# Patient Record
Sex: Male | Born: 1970 | Race: White | Hispanic: No | Marital: Married | Smoking: Former smoker
Health system: Southern US, Community
[De-identification: ages and names within clinical notes are randomized; demographics above are authoritative.]

## PROBLEM LIST (undated history)

## (undated) DIAGNOSIS — G4730 Sleep apnea, unspecified: Secondary | ICD-10-CM

## (undated) DIAGNOSIS — M2550 Pain in unspecified joint: Secondary | ICD-10-CM

## (undated) DIAGNOSIS — G4733 Obstructive sleep apnea (adult) (pediatric): Secondary | ICD-10-CM

## (undated) DIAGNOSIS — Z79899 Other long term (current) drug therapy: Secondary | ICD-10-CM

## (undated) DIAGNOSIS — I10 Essential (primary) hypertension: Secondary | ICD-10-CM

## (undated) DIAGNOSIS — R1310 Dysphagia, unspecified: Secondary | ICD-10-CM

## (undated) DIAGNOSIS — K219 Gastro-esophageal reflux disease without esophagitis: Secondary | ICD-10-CM

## (undated) DIAGNOSIS — M549 Dorsalgia, unspecified: Secondary | ICD-10-CM

## (undated) DIAGNOSIS — I4819 Other persistent atrial fibrillation: Secondary | ICD-10-CM

## (undated) DIAGNOSIS — Z6841 Body Mass Index (BMI) 40.0 and over, adult: Secondary | ICD-10-CM

## (undated) DIAGNOSIS — G935 Compression of brain: Secondary | ICD-10-CM

## (undated) DIAGNOSIS — F32A Depression, unspecified: Secondary | ICD-10-CM

## (undated) DIAGNOSIS — T300 Burn of unspecified body region, unspecified degree: Secondary | ICD-10-CM

## (undated) DIAGNOSIS — R079 Chest pain, unspecified: Secondary | ICD-10-CM

## (undated) DIAGNOSIS — E7800 Pure hypercholesterolemia, unspecified: Secondary | ICD-10-CM

## (undated) DIAGNOSIS — R5382 Chronic fatigue, unspecified: Secondary | ICD-10-CM

## (undated) DIAGNOSIS — F419 Anxiety disorder, unspecified: Secondary | ICD-10-CM

## (undated) DIAGNOSIS — I499 Cardiac arrhythmia, unspecified: Secondary | ICD-10-CM

## (undated) DIAGNOSIS — R0602 Shortness of breath: Secondary | ICD-10-CM

## (undated) DIAGNOSIS — K222 Esophageal obstruction: Secondary | ICD-10-CM

## (undated) DIAGNOSIS — R002 Palpitations: Secondary | ICD-10-CM

## (undated) DIAGNOSIS — G9332 Myalgic encephalomyelitis/chronic fatigue syndrome: Secondary | ICD-10-CM

## (undated) HISTORY — DX: Palpitations: R00.2

## (undated) HISTORY — DX: Dysphagia, unspecified: R13.10

## (undated) HISTORY — DX: Essential (primary) hypertension: I10

## (undated) HISTORY — PX: APPENDECTOMY: SHX54

## (undated) HISTORY — DX: Sleep apnea, unspecified: G47.30

## (undated) HISTORY — DX: Morbid (severe) obesity due to excess calories: E66.01

## (undated) HISTORY — PX: KNEE SURGERY: SHX244

## (undated) HISTORY — DX: Shortness of breath: R06.02

## (undated) HISTORY — PX: HERNIA REPAIR: SHX51

## (undated) HISTORY — PX: CARDIAC CATHETERIZATION: SHX172

## (undated) HISTORY — DX: Other persistent atrial fibrillation: I48.19

## (undated) HISTORY — DX: Anxiety disorder, unspecified: F41.9

## (undated) HISTORY — DX: Depression, unspecified: F32.A

## (undated) HISTORY — DX: Chest pain, unspecified: R07.9

## (undated) HISTORY — DX: Other long term (current) drug therapy: Z79.899

## (undated) HISTORY — DX: Cardiac arrhythmia, unspecified: I49.9

## (undated) HISTORY — PX: SKIN GRAFT: SHX250

## (undated) HISTORY — DX: Esophageal obstruction: K22.2

## (undated) HISTORY — DX: Myalgic encephalomyelitis/chronic fatigue syndrome: G93.32

## (undated) HISTORY — DX: Body Mass Index (BMI) 40.0 and over, adult: Z684

## (undated) HISTORY — PX: URETHRA SURGERY: SHX824

## (undated) HISTORY — DX: Dorsalgia, unspecified: M54.9

## (undated) HISTORY — DX: Obstructive sleep apnea (adult) (pediatric): G47.33

## (undated) HISTORY — DX: Pain in unspecified joint: M25.50

## (undated) HISTORY — DX: Chronic fatigue, unspecified: R53.82

## (undated) HISTORY — DX: Gastro-esophageal reflux disease without esophagitis: K21.9

## (undated) HISTORY — DX: Pure hypercholesterolemia, unspecified: E78.00

---

## 2001-06-18 HISTORY — PX: COLONOSCOPY: SHX174

## 2014-09-01 DIAGNOSIS — I4819 Other persistent atrial fibrillation: Secondary | ICD-10-CM | POA: Insufficient documentation

## 2014-09-01 DIAGNOSIS — I10 Essential (primary) hypertension: Secondary | ICD-10-CM

## 2014-09-01 DIAGNOSIS — I119 Hypertensive heart disease without heart failure: Secondary | ICD-10-CM

## 2014-09-01 DIAGNOSIS — Z79899 Other long term (current) drug therapy: Secondary | ICD-10-CM | POA: Insufficient documentation

## 2014-09-01 HISTORY — DX: Other long term (current) drug therapy: Z79.899

## 2014-09-01 HISTORY — DX: Other persistent atrial fibrillation: I48.19

## 2014-09-01 HISTORY — DX: Hypertensive heart disease without heart failure: I11.9

## 2014-09-01 HISTORY — DX: Essential (primary) hypertension: I10

## 2014-09-02 DIAGNOSIS — K222 Esophageal obstruction: Secondary | ICD-10-CM | POA: Insufficient documentation

## 2014-09-02 DIAGNOSIS — F419 Anxiety disorder, unspecified: Secondary | ICD-10-CM

## 2014-09-02 HISTORY — DX: Esophageal obstruction: K22.2

## 2014-09-02 HISTORY — DX: Anxiety disorder, unspecified: F41.9

## 2014-12-17 HISTORY — PX: ESOPHAGOGASTRODUODENOSCOPY: SHX1529

## 2016-04-05 DIAGNOSIS — Z6841 Body Mass Index (BMI) 40.0 and over, adult: Secondary | ICD-10-CM

## 2016-04-05 DIAGNOSIS — I209 Angina pectoris, unspecified: Secondary | ICD-10-CM

## 2016-04-05 HISTORY — DX: Morbid (severe) obesity due to excess calories: E66.01

## 2016-04-05 HISTORY — DX: Angina pectoris, unspecified: I20.9

## 2016-06-05 ENCOUNTER — Encounter (HOSPITAL_COMMUNITY): Payer: Self-pay | Admitting: Nurse Practitioner

## 2016-06-05 ENCOUNTER — Emergency Department (HOSPITAL_COMMUNITY): Payer: Worker's Compensation

## 2016-06-05 ENCOUNTER — Emergency Department (HOSPITAL_COMMUNITY)
Admission: EM | Admit: 2016-06-05 | Discharge: 2016-06-05 | Disposition: A | Discharge status: Home / Self Care | Payer: Worker's Compensation | Attending: Emergency Medicine | Admitting: Emergency Medicine

## 2016-06-05 DIAGNOSIS — Z23 Encounter for immunization: Secondary | ICD-10-CM | POA: Diagnosis not present

## 2016-06-05 DIAGNOSIS — S20219A Contusion of unspecified front wall of thorax, initial encounter: Secondary | ICD-10-CM | POA: Insufficient documentation

## 2016-06-05 DIAGNOSIS — Z79899 Other long term (current) drug therapy: Secondary | ICD-10-CM | POA: Diagnosis not present

## 2016-06-05 DIAGNOSIS — S20211A Contusion of right front wall of thorax, initial encounter: Secondary | ICD-10-CM

## 2016-06-05 DIAGNOSIS — R1030 Lower abdominal pain, unspecified: Secondary | ICD-10-CM | POA: Diagnosis not present

## 2016-06-05 DIAGNOSIS — Y92410 Unspecified street and highway as the place of occurrence of the external cause: Secondary | ICD-10-CM | POA: Diagnosis not present

## 2016-06-05 DIAGNOSIS — S8012XA Contusion of left lower leg, initial encounter: Secondary | ICD-10-CM

## 2016-06-05 DIAGNOSIS — I10 Essential (primary) hypertension: Secondary | ICD-10-CM | POA: Insufficient documentation

## 2016-06-05 DIAGNOSIS — Y939 Activity, unspecified: Secondary | ICD-10-CM | POA: Insufficient documentation

## 2016-06-05 DIAGNOSIS — S0990XA Unspecified injury of head, initial encounter: Secondary | ICD-10-CM | POA: Diagnosis present

## 2016-06-05 DIAGNOSIS — Y999 Unspecified external cause status: Secondary | ICD-10-CM | POA: Insufficient documentation

## 2016-06-05 DIAGNOSIS — R079 Chest pain, unspecified: Secondary | ICD-10-CM | POA: Diagnosis not present

## 2016-06-05 DIAGNOSIS — S8001XA Contusion of right knee, initial encounter: Secondary | ICD-10-CM

## 2016-06-05 DIAGNOSIS — S0083XA Contusion of other part of head, initial encounter: Secondary | ICD-10-CM | POA: Diagnosis not present

## 2016-06-05 DIAGNOSIS — T07XXXA Unspecified multiple injuries, initial encounter: Secondary | ICD-10-CM

## 2016-06-05 HISTORY — DX: Compression of brain: G93.5

## 2016-06-05 HISTORY — DX: Essential (primary) hypertension: I10

## 2016-06-05 HISTORY — DX: Burn of unspecified body region, unspecified degree: T30.0

## 2016-06-05 LAB — I-STAT CHEM 8, ED
BUN: 19 mg/dL (ref 6–20)
CREATININE: 1.1 mg/dL (ref 0.61–1.24)
Calcium, Ion: 1.15 mmol/L (ref 1.15–1.40)
Chloride: 106 mmol/L (ref 101–111)
Glucose, Bld: 103 mg/dL — ABNORMAL HIGH (ref 65–99)
HEMATOCRIT: 44 % (ref 39.0–52.0)
HEMOGLOBIN: 15 g/dL (ref 13.0–17.0)
POTASSIUM: 3.7 mmol/L (ref 3.5–5.1)
SODIUM: 140 mmol/L (ref 135–145)
TCO2: 23 mmol/L (ref 0–100)

## 2016-06-05 LAB — COMPREHENSIVE METABOLIC PANEL
ALBUMIN: 4.1 g/dL (ref 3.5–5.0)
ALT: 23 U/L (ref 17–63)
ANION GAP: 11 (ref 5–15)
AST: 25 U/L (ref 15–41)
Alkaline Phosphatase: 51 U/L (ref 38–126)
BUN: 15 mg/dL (ref 6–20)
CO2: 23 mmol/L (ref 22–32)
Calcium: 9.3 mg/dL (ref 8.9–10.3)
Chloride: 105 mmol/L (ref 101–111)
Creatinine, Ser: 1.11 mg/dL (ref 0.61–1.24)
GFR calc Af Amer: >60 mL/min (ref >60)
Glucose, Bld: 102 mg/dL — ABNORMAL HIGH (ref 65–99)
POTASSIUM: 3.7 mmol/L (ref 3.5–5.1)
Sodium: 139 mmol/L (ref 135–145)
TOTAL PROTEIN: 6.4 g/dL — AB (ref 6.5–8.1)
Total Bilirubin: 0.8 mg/dL (ref 0.3–1.2)

## 2016-06-05 LAB — CBC
HEMATOCRIT: 44.7 % (ref 39.0–52.0)
Hemoglobin: 15.6 g/dL (ref 13.0–17.0)
MCH: 30.7 pg (ref 26.0–34.0)
MCHC: 34.9 g/dL (ref 30.0–36.0)
MCV: 88 fL (ref 78.0–100.0)
Platelets: 230 10*3/uL (ref 150–400)
RBC: 5.08 MIL/uL (ref 4.22–5.81)
RDW: 13.2 % (ref 11.5–15.5)
WBC: 11.5 10*3/uL — AB (ref 4.0–10.5)

## 2016-06-05 LAB — URINALYSIS, ROUTINE W REFLEX MICROSCOPIC
Bilirubin Urine: NEGATIVE
Glucose, UA: NEGATIVE mg/dL
Hgb urine dipstick: NEGATIVE
Ketones, ur: NEGATIVE mg/dL
LEUKOCYTES UA: NEGATIVE
NITRITE: NEGATIVE
Protein, ur: NEGATIVE mg/dL
SPECIFIC GRAVITY, URINE: 1.006 (ref 1.005–1.030)
pH: 5 (ref 5.0–8.0)

## 2016-06-05 LAB — I-STAT CG4 LACTIC ACID, ED: LACTIC ACID, VENOUS: 1.91 mmol/L — AB (ref 0.5–1.9)

## 2016-06-05 LAB — SAMPLE TO BLOOD BANK

## 2016-06-05 LAB — PROTIME-INR
INR: 1.41
Prothrombin Time: 17.4 seconds — ABNORMAL HIGH (ref 11.4–15.2)

## 2016-06-05 LAB — ETHANOL: Alcohol, Ethyl (B): <5 mg/dL (ref <5)

## 2016-06-05 MED ORDER — HYDROCODONE-ACETAMINOPHEN 5-325 MG PO TABS: 1.0000 | ORAL_TABLET | ORAL | 0 refills | Status: DC | PRN

## 2016-06-05 MED ORDER — CYCLOBENZAPRINE HCL 10 MG PO TABS: 10.0000 mg | ORAL_TABLET | Freq: Three times a day (TID) | ORAL | 0 refills | Status: DC | PRN

## 2016-06-05 MED ORDER — SODIUM CHLORIDE 0.9 % IV BOLUS (SEPSIS)
1000.0000 mL | Freq: Once | INTRAVENOUS | Status: AC
  Administered 2016-06-05: 1000 mL via INTRAVENOUS

## 2016-06-05 MED ORDER — TETANUS-DIPHTH-ACELL PERTUSSIS 5-2.5-18.5 LF-MCG/0.5 IM SUSP
0.5000 mL | Freq: Once | INTRAMUSCULAR | Status: AC
  Administered 2016-06-05: 0.5 mL via INTRAMUSCULAR
  Filled 2016-06-05: qty 0.5

## 2016-06-05 MED ORDER — MORPHINE SULFATE (PF) 4 MG/ML IV SOLN
4.0000 mg | Freq: Once | INTRAVENOUS | Status: AC
  Administered 2016-06-05: 4 mg via INTRAVENOUS
  Filled 2016-06-05: qty 1

## 2016-06-05 MED ORDER — IOPAMIDOL (ISOVUE-300) INJECTION 61%
INTRAVENOUS | Status: AC
  Administered 2016-06-05: 100 mL
  Filled 2016-06-05: qty 100

## 2016-06-05 MED ORDER — FENTANYL CITRATE (PF) 100 MCG/2ML IJ SOLN
50.0000 ug | Freq: Once | INTRAMUSCULAR | Status: AC
  Administered 2016-06-05: 50 ug via INTRAVENOUS
  Filled 2016-06-05: qty 2

## 2016-06-05 MED ORDER — ONDANSETRON HCL 4 MG/2ML IJ SOLN
4.0000 mg | Freq: Once | INTRAMUSCULAR | Status: AC
  Administered 2016-06-05: 4 mg via INTRAVENOUS
  Filled 2016-06-05: qty 2

## 2016-06-05 NOTE — ED Notes (Signed)
Applied bacitracin ointment and gauze to affected areas.

## 2016-06-05 NOTE — ED Notes (Signed)
Pt to scans 

## 2016-06-05 NOTE — ED Notes (Signed)
Patient returned from scans.
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
## 2016-06-05 NOTE — Discharge Instructions (Signed)
Read the information below.  Use the prescribed medication as directed.  Please discuss all new medications with your pharmacist.  Do not take additional tylenol while taking the prescribed pain medication to avoid overdose.  You may return to the Emergency Department at any time for worsening condition or any new symptoms that concern you.     If you develop uncontrolled pain, weakness or numbness of the extremity, severe discoloration of the skin, your foot turns cold, or you are unable to walk or move your leg, return to the ER for a recheck.     If you develop redness, swelling, pus draining from the wound, or fevers greater than 100.4, return to the ER immediately for a recheck.    Please do not take your blood thinner today or tomorrow.  Speak with your primary care provider and resume taking it the next day.
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
## 2016-09-07 ENCOUNTER — Telehealth: Payer: Self-pay | Admitting: Cardiology

## 2016-09-07 NOTE — Telephone Encounter (Signed)
Closed Encounter  °

## 2016-09-12 ENCOUNTER — Encounter: Payer: Self-pay | Admitting: Cardiology

## 2016-10-02 ENCOUNTER — Ambulatory Visit: Payer: Self-pay | Admitting: Cardiology

## 2016-10-15 DIAGNOSIS — I160 Hypertensive urgency: Secondary | ICD-10-CM | POA: Diagnosis not present

## 2016-10-15 DIAGNOSIS — K219 Gastro-esophageal reflux disease without esophagitis: Secondary | ICD-10-CM | POA: Diagnosis not present

## 2016-10-15 DIAGNOSIS — I4891 Unspecified atrial fibrillation: Secondary | ICD-10-CM | POA: Diagnosis not present

## 2016-10-15 DIAGNOSIS — R079 Chest pain, unspecified: Secondary | ICD-10-CM | POA: Diagnosis not present

## 2016-10-16 ENCOUNTER — Encounter: Payer: Self-pay | Admitting: Cardiology

## 2016-10-16 DIAGNOSIS — Z79899 Other long term (current) drug therapy: Secondary | ICD-10-CM | POA: Insufficient documentation

## 2016-10-16 DIAGNOSIS — Z7901 Long term (current) use of anticoagulants: Secondary | ICD-10-CM | POA: Insufficient documentation

## 2016-10-16 DIAGNOSIS — I4891 Unspecified atrial fibrillation: Secondary | ICD-10-CM | POA: Diagnosis not present

## 2016-10-16 DIAGNOSIS — I2510 Atherosclerotic heart disease of native coronary artery without angina pectoris: Secondary | ICD-10-CM | POA: Insufficient documentation

## 2016-10-16 DIAGNOSIS — I420 Dilated cardiomyopathy: Secondary | ICD-10-CM

## 2016-10-16 DIAGNOSIS — I160 Hypertensive urgency: Secondary | ICD-10-CM | POA: Diagnosis not present

## 2016-10-16 DIAGNOSIS — K219 Gastro-esophageal reflux disease without esophagitis: Secondary | ICD-10-CM | POA: Diagnosis not present

## 2016-10-16 DIAGNOSIS — Z5181 Encounter for therapeutic drug level monitoring: Secondary | ICD-10-CM | POA: Insufficient documentation

## 2016-10-16 DIAGNOSIS — R079 Chest pain, unspecified: Secondary | ICD-10-CM | POA: Diagnosis not present

## 2016-10-16 HISTORY — DX: Other long term (current) drug therapy: Z79.899

## 2016-10-16 HISTORY — DX: Atherosclerotic heart disease of native coronary artery without angina pectoris: I25.10

## 2016-10-16 HISTORY — DX: Encounter for therapeutic drug level monitoring: Z51.81

## 2016-10-16 HISTORY — DX: Long term (current) use of anticoagulants: Z79.01

## 2016-10-16 HISTORY — DX: Dilated cardiomyopathy: I42.0

## 2016-10-17 ENCOUNTER — Encounter: Payer: Self-pay | Admitting: Cardiology

## 2016-10-17 ENCOUNTER — Ambulatory Visit (INDEPENDENT_AMBULATORY_CARE_PROVIDER_SITE_OTHER): Payer: Self-pay | Admitting: Cardiology

## 2016-10-17 VITALS — BP 124/94 | HR 84 | Ht 75.0 in | Wt 365.4 lb

## 2016-10-17 DIAGNOSIS — I481 Persistent atrial fibrillation: Secondary | ICD-10-CM | POA: Diagnosis not present

## 2016-10-17 DIAGNOSIS — Z7901 Long term (current) use of anticoagulants: Secondary | ICD-10-CM

## 2016-10-17 DIAGNOSIS — I4819 Other persistent atrial fibrillation: Secondary | ICD-10-CM

## 2016-10-17 DIAGNOSIS — I2510 Atherosclerotic heart disease of native coronary artery without angina pectoris: Secondary | ICD-10-CM

## 2016-10-17 DIAGNOSIS — I420 Dilated cardiomyopathy: Secondary | ICD-10-CM
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
## 2016-10-17 NOTE — Patient Instructions (Addendum)
Medication Instructions:  Your physician recommends that you continue on your current medications as directed. Please refer to the Current Medication list given to you today.  Labwork: None  Testing/Procedures: Your physician has recommended that you wear a holter monitor. Holter monitors are medical devices that record the heart's electrical activity. Doctors most often use these monitors to diagnose arrhythmias. Arrhythmias are problems with the speed or rhythm of the heartbeat. The monitor is a small, portable device. You can wear one while you do your normal daily activities. This is usually used to diagnose what is causing palpitations/syncope (passing out).   You had an EKG today   Follow-Up: You will see Dr Johney FrameAllred with [facility] at [facility]  Any Other Special Instructions Will Be Listed Below (If Applicable).     If you need a refill on your cardiac medications before your next appointment, please call your pharmacy.

## 2016-10-30 ENCOUNTER — Institutional Professional Consult (permissible substitution): Payer: Self-pay | Admitting: Internal Medicine

## 2016-10-31 ENCOUNTER — Encounter: Payer: Self-pay | Admitting: *Deleted

## 2016-10-31 ENCOUNTER — Ambulatory Visit (INDEPENDENT_AMBULATORY_CARE_PROVIDER_SITE_OTHER): Payer: 59

## 2016-10-31 DIAGNOSIS — I481 Persistent atrial fibrillation: Secondary | ICD-10-CM

## 2016-10-31 DIAGNOSIS — I4819 Other persistent atrial fibrillation: Secondary | ICD-10-CM

## 2016-11-06 ENCOUNTER — Telehealth: Payer: Self-pay | Admitting: Pharmacist

## 2016-11-06 ENCOUNTER — Other Ambulatory Visit: Payer: Self-pay | Admitting: Cardiology

## 2016-11-06 ENCOUNTER — Ambulatory Visit (INDEPENDENT_AMBULATORY_CARE_PROVIDER_SITE_OTHER): Payer: 59 | Admitting: Internal Medicine

## 2016-11-06 ENCOUNTER — Telehealth: Payer: Self-pay | Admitting: Internal Medicine

## 2016-11-06 ENCOUNTER — Encounter: Payer: Self-pay | Admitting: Internal Medicine

## 2016-11-06 VITALS — BP 116/84 | HR 96 | Ht 75.0 in | Wt 352.6 lb

## 2016-11-06 DIAGNOSIS — G4733 Obstructive sleep apnea (adult) (pediatric): Secondary | ICD-10-CM

## 2016-11-06 DIAGNOSIS — I482 Chronic atrial fibrillation, unspecified: Secondary | ICD-10-CM

## 2016-11-06 DIAGNOSIS — I481 Persistent atrial fibrillation: Secondary | ICD-10-CM | POA: Diagnosis not present

## 2016-11-06 DIAGNOSIS — I4819 Other persistent atrial fibrillation: Secondary | ICD-10-CM

## 2016-11-06 DIAGNOSIS — I420 Dilated cardiomyopathy: Secondary | ICD-10-CM | POA: Diagnosis not present

## 2016-11-06 MED ORDER — DILTIAZEM HCL ER COATED BEADS 180 MG PO CP24: 180.0000 mg | ORAL_CAPSULE | Freq: Every day | ORAL | 3 refills | Status: DC

## 2016-11-06 NOTE — Telephone Encounter (Signed)
New message   Pt states he called his insurance per Dr. Johney Frame and the insurance company told him that our office had to call. They do not have record of any procedures. He needs assistance with this as son as possible for his prescription Tykosin.

## 2016-11-06 NOTE — Progress Notes (Signed)
Electrophysiology Office Note   Date:  11/06/2016   ID:  Jesus Barnes, DOB 01-07-1971, MRN [id number]  PCP:  [name], PA-C  Cardiologist:  Dr Dulce Sellar Primary Electrophysiologist: Hillis Range, MD    Chief Complaint  Patient presents with  . Atrial Fibrillation     History of Present Illness: Jesus Barnes is a 46 y.o. male who presents today for electrophysiology evaluation.   The patient is referred by Dr Dulce Sellar for EP consultation regarding longstanding persistent afib.  He thinks that he has been in afib since summer of 2017.  He has fatigue and decreased exercise tolerate with his afib.   He reports initially being diagnosed with atrial fibrillation more than 10 years ago.  He was evaluated by Dr Dulce Sellar and was prescribed flecainide.  He did very well with this medicine for several years.  He eventually stopped flecainide because he felt well.  Over time, his afib returned.  He has had more refractory afib since.  He has been followed by Dr Karren Burly and says that he has had an ablation previously (I do not have records to confirm this).  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 He has had difficulty with lifestyle modification.  He has weighed as much as 400 lbs previously but is currently 350 lbs.  He states that he has been diagnosed with OSA but has not started CPAP yet.  He is "waiting to hear from his primary doctor".   Today, he denies symptoms of  orthopnea, PND, lower extremity edema, claudication, dizziness, presyncope, syncope, bleeding, or neurologic sequela. The patient is tolerating medications without difficulties and is otherwise without complaint today.    Past Medical History:  Diagnosis Date  . Anxiety 09/02/2014  . Burn   . Esophageal stricture 09/02/2014  . Essential hypertension 09/01/2014  . 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HCC)   . Hypertension   . Morbid obesity with body mass index (BMI) of 45.0 to 49.9 in adult [facility]) 04/05/2016  . Obstructive sleep apnea     awaiting treatment  . Persistent atrial fibrillation (HCC) 09/01/2014   Overview:  CHADS2=1   Past Surgical History:  Procedure Laterality Date  . APPENDECTOMY    . CARDIOVERSION    . KNEE SURGERY    . SKIN GRAFT       Current Outpatient Prescriptions  Medication Sig Dispense Refill  . FLUoxetine (PROZAC) 20 MG capsule Take 20 mg by mouth daily. Take 1 - 2 tablets mouth at bedtime as needed    . lisinopril (PRINIVIL,ZESTRIL) 10 MG tablet Take 10 mg by mouth daily.    . metoprolol tartrate (LOPRESSOR) 50 MG tablet Take 1.5 tablets by mouth 2 (two) times daily.    . nitroGLYCERIN (NITROSTAT) 0.4 MG SL tablet Place 0.4 mg under the tongue every 5 (five) minutes as needed for chest pain. Chest pain    . pantoprazole (PROTONIX) 40 MG tablet Take 40 mg by mouth daily.    Carlena Hurl 20 MG TABS tablet Take 20 mg by mouth every evening.    . diltiazem (CARDIZEM CD) 180 MG 24 hr capsule Take 1 capsule (180 mg total) by mouth daily. 30 capsule 3   No current facility-administered medications for this visit.     Allergies:   Patient has no known allergies.   Social History:  The patient  reports that he has quit smoking. His smoking use included Cigars. He has never used smokeless tobacco. He reports that he drinks alcohol. He reports that  he does not use drugs.   Family History:  The patient's  family history includes Diabetes in his paternal grandfather; Heart disease in his paternal grandfather.    ROS:  Please see the history of present illness.   All other systems are personally reviewed and negative.    PHYSICAL EXAM: VS:  BP 116/84   Pulse 96   Ht 6\' 3"  (1.905 m)   Wt (!) 352 lb 9.6 oz (159.9 kg)   SpO2 98%   BMI 44.07 kg/m  , BMI Body mass index is 44.07 kg/m. GEN: morbidly obese, in no acute distress  HEENT: normal  Neck: no JVD, carotid bruits, or masses Cardiac: iRRR; no murmurs, rubs, or gallops,no edema  Respiratory:  clear to auscultation bilaterally, normal work  of breathing GI: soft, nontender, nondistended, + BS MS: no deformity or atrophy  Skin: warm and dry  Neuro:  Strength and sensation are intact Psych: euthymic mood, full affect  Prior ekgs are reviewed and reveal afib,  Incomplete RBBB, QTc stable and appropriate for tikosyn  Recent Labs: 06/05/2016: ALT 23; BUN 19; Creatinine, Ser 1.10; Hemoglobin 15.0; Platelets 230; Potassium 3.7; Sodium 140  personally reviewed   Lipid Panel  No results found for: CHOL, TRIG, HDL, CHOLHDL, VLDL, LDLCALC, LDLDIRECT personally reviewed   Wt Readings from Last 3 Encounters:  11/06/16 (!) 352 lb 9.6 oz (159.9 kg)  10/17/16 (!) 365 lb 6.4 oz (165.7 kg)  06/05/16 (!) 351 lb (159.2 kg)     Other studies personally reviewed: Additional studies/ records that were reviewed today include: Dr Hulen Shouts notes, recent holter monitor Review of the above records today demonstrates: as above   ASSESSMENT AND PLAN:  1.  Longstanding persistent afib Very symptomatic Limited options I think that given morbid obesity, untreated sleep apnea, and longstanding persistent afib that his anticipated success with ablation is quite low.  I also worry that given his young age that AV nodal ablation may not be a good option. I would advise tikosyn as our next step.  We will arrange for admission at the next available time for initiation of tikosyn. He has a previously implanted loop recorder in place.  I would likely remove this during his hospitalization for tikosyn. Request records from Dr Lang Snow  2. 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 Morbid obesity Body mass index is 44.07 kg/m. Lifestyle modification discussed at length I have reviewed the patients BMI and decreased success rates with ablation at length today.  Weight loss is strongly advised.  Per Guijian et al (PACE 2013; 36: 423-953), patients with BMI 25-29.9  (obese) have a 27% increase in AF recurrence post ablation.  Patients with BMI >30 have a 31% increase in AF recurrence post ablation when compared to those with BMI <25.  4. HTN Stable No change required today  5. OSA He will need to initiate treatment.  I have advised that he contact his PCP   Current medicines are reviewed at length with the patient today.   The patient does not have concerns regarding his medicines.  The following changes were made today:  none  Will plan initiation of tikosyn later this week  Signed, Hillis Range, MD  11/06/2016 4:09 PM     [facility] [address] [place] [address] [phone] (office) [phone] (fax)

## 2016-11-06 NOTE — Patient Instructions (Addendum)
Medication Instructions:  Your physician recommends that you continue on your current medications as directed. Please refer to the Current Medication list given to you today.   Labwork: None ordered   Testing/Procedures: Tikosyn load--- Be at the afib clinic tomorrow at [place].  [employer] but leave in car.  Will be admitted tomorrow   Follow-Up: Your physician recommends that you schedule a follow-up appointment To be determined    Please call your insurance company to discuss price of medication.  Dofetilide which is the generic for Tikosyn

## 2016-11-06 NOTE — Telephone Encounter (Signed)
Medication list reviewed in anticipation of upcoming Tikosyn initiation tomorrow. Patient is not currently on any contraindicated medications; however, pt is taking fluoxetine which can cause QT prolongation. Given that pt already has baseline elevated QTc, recommend transitioning fluoxetine to Cymbalta. Per Dr. Hulen Shouts note, pt has previously been on amiodarone, but it is unclear when pt was taken off of this medication - please confirm he has been off for at least 3 months or obtain an amiodarone level. Patient is anticoagulated on Xarelto 20mg  on the appropriate dose. Of note, last BMET was in March 2018 and potassium was low at this time - may need to replete prior to admission for Tikosyn initiation.  Please ensure that patient has not missed any anticoagulation doses in the 3 weeks prior to Tikosyn initiation. Patient will need to be counseled to avoid use of Benadryl while on Tikosyn and in the 2-3 days prior to Tikosyn initiation.

## 2016-11-07 ENCOUNTER — Telehealth: Payer: Self-pay

## 2016-11-07 ENCOUNTER — Ambulatory Visit (HOSPITAL_COMMUNITY)
Admission: RE | Admit: 2016-11-07 | Discharge: 2016-11-07 | Disposition: A | Discharge status: Home / Self Care | Payer: 59 | Source: Ambulatory Visit | Attending: Nurse Practitioner | Admitting: Nurse Practitioner

## 2016-11-07 ENCOUNTER — Other Ambulatory Visit: Payer: Self-pay

## 2016-11-07 VITALS — BP 110/78 | HR 74 | Ht 75.0 in | Wt 354.2 lb

## 2016-11-07 DIAGNOSIS — Z833 Family history of diabetes mellitus: Secondary | ICD-10-CM | POA: Insufficient documentation

## 2016-11-07 DIAGNOSIS — Z87891 Personal history of nicotine dependence: Secondary | ICD-10-CM | POA: Diagnosis not present

## 2016-11-07 DIAGNOSIS — F419 Anxiety disorder, unspecified: Secondary | ICD-10-CM | POA: Insufficient documentation

## 2016-11-07 DIAGNOSIS — I10 Essential (primary) hypertension: Secondary | ICD-10-CM | POA: Insufficient documentation

## 2016-11-07 DIAGNOSIS — I481 Persistent atrial fibrillation: Secondary | ICD-10-CM | POA: Diagnosis present

## 2016-11-07 DIAGNOSIS — Z8249 Family history of ischemic heart disease and other diseases of the circulatory system: Secondary | ICD-10-CM | POA: Insufficient documentation

## 2016-11-07 DIAGNOSIS — Z9889 Other specified postprocedural states: Secondary | ICD-10-CM | POA: Insufficient documentation

## 2016-11-07 DIAGNOSIS — G4733 Obstructive sleep apnea (adult) (pediatric): Secondary | ICD-10-CM | POA: Insufficient documentation

## 2016-11-07 DIAGNOSIS — Z7902 Long term (current) use of antithrombotics/antiplatelets: Secondary | ICD-10-CM | POA: Insufficient documentation

## 2016-11-07 DIAGNOSIS — Z6841 Body Mass Index (BMI) 40.0 and over, adult: Secondary | ICD-10-CM | POA: Diagnosis not present

## 2016-11-07 DIAGNOSIS — I4819 Other persistent atrial fibrillation: Secondary | ICD-10-CM

## 2016-11-07 LAB — BASIC METABOLIC PANEL
ANION GAP: 7 (ref 5–15)
BUN: 17 mg/dL (ref 6–20)
CHLORIDE: 106 mmol/L (ref 101–111)
CO2: 25 mmol/L (ref 22–32)
CREATININE: 0.91 mg/dL (ref 0.61–1.24)
Calcium: 9 mg/dL (ref 8.9–10.3)
GFR calc non Af Amer: >60 mL/min (ref >60)
Glucose, Bld: 118 mg/dL — ABNORMAL HIGH (ref 65–99)
POTASSIUM: 4.3 mmol/L (ref 3.5–5.1)
Sodium: 138 mmol/L (ref 135–145)

## 2016-11-07 LAB — MAGNESIUM: MAGNESIUM: 2 mg/dL (ref 1.7–2.4)

## 2016-11-07 NOTE — Progress Notes (Signed)
Primary Care Physician: [name], PA-C Referring Physician: Dr. Johney Frame Cardiologist:    Jax Kentner is a 46 y.o. male with a h/o persistent afib in the past with flecainde working for a while, tried  amiodarone to restore SR, but was not effective.He thinks in persisitence afib since summer of last year.. Weight in past thought to be a deterrent to ablation. H/o TMC at 40%. Previously seen by [name] in [facility].  He ws seen by Dr. Johney Frame yesterday and plans put in place for admission for tikosyn today. Pt today, reports that he may have been on amiodarone as late as early June. He did pick up a refill on 5/21 and it was listed on PCP list of meds when he was seen there June 19th. Pt cannot remember why it was stopped but no longer on drug. Since it has not been 3 months since he was off drug, will have to put off admission and draw an amiodarone level today. Has been dx'ed recently with OSA but has not started CPAP. He is on xarelto 20 mg daily for chadsvasc score of at least 2.  Today, he denies symptoms of palpitations, chest pain, shortness of breath, orthopnea, PND, lower extremity edema, dizziness, presyncope, syncope, or neurologic sequela. The patient is tolerating medications without difficulties and is otherwise without complaint today.   Past Medical History:  Diagnosis Date  . Anxiety 09/02/2014  . Burn   . Esophageal stricture 09/02/2014  . Essential hypertension 09/01/2014  . 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HCC)   . Hypertension   . Morbid obesity with body mass index (BMI) of 45.0 to 49.9 in adult [facility]) 04/05/2016  . Obstructive sleep apnea    awaiting treatment  . Persistent atrial fibrillation (HCC) 09/01/2014   Overview:  CHADS2=1   Past Surgical History:  Procedure Laterality Date  . APPENDECTOMY    . CARDIOVERSION    . KNEE SURGERY    . SKIN GRAFT      Current Outpatient Prescriptions  Medication Sig Dispense Refill  . FLUoxetine (PROZAC) 20 MG capsule Take 20 mg by  mouth daily. Take 1 - 2 tablets mouth at bedtime as needed    . lisinopril (PRINIVIL,ZESTRIL) 10 MG tablet Take 10 mg by mouth daily.    . metoprolol tartrate (LOPRESSOR) 50 MG tablet Take 1.5 tablets by mouth 2 (two) times daily.    . nitroGLYCERIN (NITROSTAT) 0.4 MG SL tablet Place 0.4 mg under the tongue every 5 (five) minutes as needed for chest pain. Chest pain    . pantoprazole (PROTONIX) 40 MG tablet Take 40 mg by mouth daily.    Carlena Hurl 20 MG TABS tablet Take 20 mg by mouth every evening.     No current facility-administered medications for this encounter.     No Known Allergies  Social History   Social History  . Marital status: Married    Spouse name: N/A  . Number of children: N/A  . Years of education: N/A   Occupational History  . Not on file.   Social History Main Topics  . Smoking status: Former Smoker    Types: Cigars  . Smokeless tobacco: Never Used     Comment: ocassional cigar  . Alcohol use Yes     Comment: weekends  . Drug use: No  . Sexual activity: Not on file   Other Topics Concern  . 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 Diabetes Paternal Grandfather   . Heart disease Paternal Grandfather     ROS- All systems are reviewed and negative except as per the HPI above  Physical Exam: Vitals:   11/07/16 1120  BP: 110/78  Pulse: 74  Weight: (!) 354 lb 3.2 oz (160.7 kg)  Height: 6\' 3"  (1.905 m)   Wt Readings from Last 3 Encounters:  11/07/16 (!) 354 lb 3.2 oz (160.7 kg)  11/06/16 (!) 352 lb 9.6 oz (159.9 kg)  10/17/16 (!) 365 lb 6.4 oz (165.7 kg)    Labs: Lab Results  Component Value Date   NA 138 11/07/2016   K 4.3 11/07/2016   CL 106 11/07/2016   CO2 25 11/07/2016   GLUCOSE 118 (H) 11/07/2016   BUN 17 11/07/2016   CREATININE 0.91 11/07/2016   CALCIUM 9.0 11/07/2016   MG 2.0 11/07/2016   Lab Results  Component Value Date   INR  1.41 06/05/2016   No results found for: CHOL, HDL, LDLCALC, TRIG   GEN- The patient is well appearing, alert and oriented x 3 today.   Head- normocephalic, atraumatic Eyes-  Sclera clear, conjunctiva pink Ears- hearing intact Oropharynx- clear Neck- supple, no JVP Lymph- no cervical lymphadenopathy Lungs- Clear to ausculation bilaterally, normal work of breathing Heart- Regular rate and rhythm, no murmurs, rubs or gallops, PMI not laterally displaced GI- soft, NT, ND, + BS Extremities- no clubbing, cyanosis, or edema MS- no significant deformity or atrophy Skin- no rash or lesion Psych- euthymic mood, full affect Neuro- strength and sensation are intact  EKG-afib at 74 bpm, qrs int 102 ms, qtc 479 ms Epic records reviewed    Assessment and Plan: 1. Persistent afib  Pt is not sure when he stopped amiodarone, have reason to believe he was on this drug as of the middle of June, so will not plan for admission today but draw an amiodarone level and plan admission based on the results of this. Drugs have been reviewed by PharmD, he is on Prozac which can prolong qtc but Dr. Johney Frame is ok to keep drug on board. Qtc is 479 ms No missed doses of xarelto.   Will inform pt of plan when level is back  Lupita Leash C. [facility] [address] [place], [place] [address] [phone]

## 2016-11-07 NOTE — Telephone Encounter (Signed)
Plan changed to not start diltiazem 180 mg. Informed pharmacy of change. Left patient message to return call to advise him not to begin medication.

## 2016-11-07 NOTE — Telephone Encounter (Signed)
Insurance company contacted - unable to give exact amt of cost due to need for preauthorization. Talked with wife they are ok with whatever the cost will be "if it will help him".

## 2016-11-07 NOTE — Telephone Encounter (Signed)
Informed Kathie Rhodes for patient not to take diltiazem. Medication was ordered yesterday. Treatment plan changed after seeing Dr. Johney Frame. Kathie Rhodes will inform the patient not to take diltiazem.

## 2016-11-08 LAB — AMIODARONE LEVEL
Amiodarone Lvl: 0.4 ug/mL — ABNORMAL LOW (ref 1.0–2.5)
N-DESETHYL-AMIODARONE: 0.4 ug/mL — AB (ref 1.0–2.5)

## 2016-11-24 ENCOUNTER — Ambulatory Visit (HOSPITAL_COMMUNITY)
Admission: RE | Admit: 2016-11-24 | Discharge: 2016-11-24 | Disposition: A | Discharge status: Home / Self Care | Payer: 59 | Source: Ambulatory Visit | Attending: Nurse Practitioner | Admitting: Nurse Practitioner

## 2016-11-24 DIAGNOSIS — I481 Persistent atrial fibrillation: Secondary | ICD-10-CM | POA: Insufficient documentation

## 2016-11-27 LAB — AMIODARONE LEVEL
AMIODARONE LVL: 0.4 ug/mL — AB (ref 1.0–2.5)
N-Desethyl-Amiodarone: 0.5 ug/mL — ABNORMAL LOW (ref 1.0–2.5)

## 2016-12-12 ENCOUNTER — Ambulatory Visit (HOSPITAL_COMMUNITY)
Admission: RE | Admit: 2016-12-12 | Discharge: 2016-12-12 | Disposition: A | Discharge status: Home / Self Care | Payer: 59 | Source: Ambulatory Visit | Attending: Nurse Practitioner | Admitting: Nurse Practitioner

## 2016-12-12 ENCOUNTER — Encounter (HOSPITAL_COMMUNITY): Payer: Self-pay | Admitting: Nurse Practitioner

## 2016-12-12 ENCOUNTER — Inpatient Hospital Stay (HOSPITAL_COMMUNITY)
Admission: AD | Admit: 2016-12-12 | Discharge: 2016-12-15 | DRG: 309 | Disposition: A | Discharge status: Home / Self Care | Payer: 59 | Source: Ambulatory Visit | Attending: Cardiology | Admitting: Cardiology

## 2016-12-12 ENCOUNTER — Other Ambulatory Visit: Payer: Self-pay

## 2016-12-12 VITALS — BP 122/92 | HR 86 | Ht 75.0 in | Wt 363.0 lb

## 2016-12-12 DIAGNOSIS — F419 Anxiety disorder, unspecified: Secondary | ICD-10-CM | POA: Diagnosis present

## 2016-12-12 DIAGNOSIS — Z79899 Other long term (current) drug therapy: Secondary | ICD-10-CM

## 2016-12-12 DIAGNOSIS — Z8249 Family history of ischemic heart disease and other diseases of the circulatory system: Secondary | ICD-10-CM | POA: Diagnosis not present

## 2016-12-12 DIAGNOSIS — I10 Essential (primary) hypertension: Secondary | ICD-10-CM | POA: Diagnosis present

## 2016-12-12 DIAGNOSIS — Z6841 Body Mass Index (BMI) 40.0 and over, adult: Secondary | ICD-10-CM

## 2016-12-12 DIAGNOSIS — Z87891 Personal history of nicotine dependence: Secondary | ICD-10-CM

## 2016-12-12 DIAGNOSIS — Z7901 Long term (current) use of anticoagulants: Secondary | ICD-10-CM

## 2016-12-12 DIAGNOSIS — G4733 Obstructive sleep apnea (adult) (pediatric): Secondary | ICD-10-CM | POA: Diagnosis present

## 2016-12-12 DIAGNOSIS — K222 Esophageal obstruction: Secondary | ICD-10-CM | POA: Diagnosis present

## 2016-12-12 DIAGNOSIS — R51 Headache: Secondary | ICD-10-CM | POA: Diagnosis present

## 2016-12-12 DIAGNOSIS — I481 Persistent atrial fibrillation: Secondary | ICD-10-CM | POA: Diagnosis present

## 2016-12-12 DIAGNOSIS — I4891 Unspecified atrial fibrillation: Secondary | ICD-10-CM | POA: Diagnosis not present

## 2016-12-12 DIAGNOSIS — I4819 Other persistent atrial fibrillation: Secondary | ICD-10-CM

## 2016-12-12 DIAGNOSIS — Z833 Family history of diabetes mellitus: Secondary | ICD-10-CM | POA: Diagnosis not present

## 2016-12-12 DIAGNOSIS — I480 Paroxysmal atrial fibrillation: Secondary | ICD-10-CM

## 2016-12-12 HISTORY — DX: Paroxysmal atrial fibrillation: I48.0

## 2016-12-12 LAB — BASIC METABOLIC PANEL
ANION GAP: 8 (ref 5–15)
ANION GAP: 4 — AB (ref 5–15)
BUN: 9 mg/dL (ref 6–20)
BUN: 11 mg/dL (ref 6–20)
CALCIUM: 8.6 mg/dL — AB (ref 8.9–10.3)
CHLORIDE: 105 mmol/L (ref 101–111)
CO2: 24 mmol/L (ref 22–32)
CO2: 27 mmol/L (ref 22–32)
Calcium: 8.8 mg/dL — ABNORMAL LOW (ref 8.9–10.3)
Chloride: 102 mmol/L (ref 101–111)
Creatinine, Ser: 1.02 mg/dL (ref 0.61–1.24)
Creatinine, Ser: 0.87 mg/dL (ref 0.61–1.24)
GFR calc Af Amer: >60 mL/min (ref >60)
GFR calc Af Amer: >60 mL/min (ref >60)
GLUCOSE: 112 mg/dL — AB (ref 65–99)
GLUCOSE: 92 mg/dL (ref 65–99)
POTASSIUM: 4.5 mmol/L (ref 3.5–5.1)
POTASSIUM: 4 mmol/L (ref 3.5–5.1)
SODIUM: 133 mmol/L — AB (ref 135–145)
Sodium: 137 mmol/L (ref 135–145)

## 2016-12-12 LAB — MAGNESIUM
MAGNESIUM: 2 mg/dL (ref 1.7–2.4)
Magnesium: 1.9 mg/dL (ref 1.7–2.4)

## 2016-12-12 MED ORDER — SODIUM CHLORIDE 0.9% FLUSH
3.0000 mL | Freq: Two times a day (BID) | INTRAVENOUS | Status: DC
  Administered 2016-12-12 – 2016-12-14 (×3): 3 mL via INTRAVENOUS

## 2016-12-12 MED ORDER — RIVAROXABAN 20 MG PO TABS
20.0000 mg | ORAL_TABLET | Freq: Every evening | ORAL | Status: DC
  Administered 2016-12-13 – 2016-12-14 (×2): 20 mg via ORAL
  Filled 2016-12-12 (×3): qty 1

## 2016-12-12 MED ORDER — METOPROLOL TARTRATE 50 MG PO TABS
75.0000 mg | ORAL_TABLET | Freq: Two times a day (BID) | ORAL | Status: DC
  Administered 2016-12-13 – 2016-12-15 (×5): 75 mg via ORAL
  Filled 2016-12-12 (×5): qty 1

## 2016-12-12 MED ORDER — PANTOPRAZOLE SODIUM 40 MG PO TBEC
40.0000 mg | DELAYED_RELEASE_TABLET | Freq: Every day | ORAL | Status: DC
Start: 2016-12-12 — End: 2016-12-15
  Administered 2016-12-13 – 2016-12-15 (×3): 40 mg via ORAL
  Filled 2016-12-12 (×4): qty 1

## 2016-12-12 MED ORDER — SODIUM CHLORIDE 0.9% FLUSH: 3.0000 mL | INTRAVENOUS | Status: DC | PRN

## 2016-12-12 MED ORDER — DOFETILIDE 500 MCG PO CAPS
500.0000 ug | ORAL_CAPSULE | Freq: Two times a day (BID) | ORAL | Status: DC
  Administered 2016-12-12 – 2016-12-15 (×6): 500 ug via ORAL
  Filled 2016-12-12 (×6): qty 1

## 2016-12-12 MED ORDER — LISINOPRIL 10 MG PO TABS
10.0000 mg | ORAL_TABLET | Freq: Every day | ORAL | Status: DC
  Administered 2016-12-13 – 2016-12-15 (×3): 10 mg via ORAL
  Filled 2016-12-12 (×4): qty 1

## 2016-12-12 MED ORDER — NITROGLYCERIN 0.4 MG SL SUBL
0.4000 mg | SUBLINGUAL_TABLET | SUBLINGUAL | Status: DC | PRN
Start: 2016-12-12 — End: 2016-12-15

## 2016-12-12 MED ORDER — SODIUM CHLORIDE 0.9 % IV SOLN: 250.0000 mL | INTRAVENOUS | Status: DC | PRN

## 2016-12-12 NOTE — Progress Notes (Signed)
EKG done prior to initial dose of Tikosyn. QTc is 476. Paged on call Cardiology that advised to go ahead and give first dose. Will repeat EKG in 3 hours

## 2016-12-12 NOTE — Progress Notes (Signed)
Pharmacy Review for Dofetilide (Tikosyn) Initiation  Admit Complaint: 46 y.o. male admitted 12/12/2016 with atrial fibrillation to be initiated on dofetilide.   Assessment:  Patient Exclusion Criteria: If any screening criteria checked as "Yes", then  patient  should NOT receive dofetilide until criteria item is corrected. If "Yes" please indicate correction plan.  YES  NO Patient  Exclusion Criteria Correction Plan    Baseline QTc interval is greater than or equal to 440 msec. IF above YES box checked dofetilide contraindicated unless patient has ICD; then may proceed if QTc 500-550 msec or with known ventricular conduction abnormalities may proceed with QTc 550-600 msec. QTc = 469 MD aware; all other parameters wnl     Magnesium level is less than 1.8 mEq/l : Last magnesium:  Lab Results  Component Value Date   MG 1.9 12/12/2016           Potassium level is less than 4 mEq/l : Last potassium:  Lab Results  Component Value Date   K 4.5 12/12/2016           Patient is known or suspected to have a digoxin level greater than 2 ng/ml: No results found for: DIGOXIN      Creatinine clearance less than 20 ml/min (calculated using Cockcroft-Gault, actual body weight and serum creatinine): Estimated Creatinine Clearance: 145.3 mL/min (by C-G formula based on SCr of 1.02 mg/dL).      Patient has received drugs known to prolong the QT intervals within the last 48 hours (phenothiazines, tricyclics or tetracyclic antidepressants, erythromycin, H-1 antihistamines, cisapride, fluoroquinolones, azithromycin). Drugs not listed above may have an, as yet, undetected potential to prolong the QT interval, updated information on QT prolonging agents is available at this website:QT prolonging agents     Patient received a dose of hydrochlorothiazide (Oretic) alone or in any combination including triamterene (Dyazide, Maxzide) in the last 48 hours.     Patient  received a medication known to increase dofetilide plasma concentrations prior to initial dofetilide dose:  . Trimethoprim (Primsol, Proloprim) in the last 36 hours . Verapamil (Calan, Verelan) in the last 36 hours or a sustained release dose in the last 72 hours . Megestrol (Megace) in the last 5 days  . Cimetidine (Tagamet) in the last 6 hours . Ketoconazole (Nizoral) in the last 24 hours . Itraconazole (Sporanox) in the last 48 hours  . Prochlorperazine (Compazine) in the last 36 hours      Patient is known to have a history of torsades de pointes; congenital or acquired long QT syndromes.     Patient has received a Class 1 antiarrhythmic with less than 2 half-lives since last dose. (Disopyramide, Quinidine, Procainamide, Lidocaine, Mexiletine, Flecainide, Propafenone)     Patient has received amiodarone therapy in the past 3 months or amiodarone level is greater than 0.3 ng/ml.    Patient has been appropriately anticoagulated with Xarelto.  Ordering provider was confirmed at [email or web address] if they are not listed on the [facility] Authorized Prescribers list.  Goal of Therapy: Follow renal function, electrolytes, potential drug interactions, and dose adjustment. Provide education and 1 week supply at discharge.  Plan:    Physician selected initial dose within range recommended for patients level of renal function - will monitor for response.    Physician selected initial dose outside of range recommended for patients level of renal function - will discuss if the dose should be altered at this time.   Select One Calculated CrCl  Dose q12h   >  60 ml/min 500 mcg   40-60 ml/min 250 mcg   20-40 ml/min 125 mcg   2. Follow up QTc after the first 5 doses, renal function, electrolytes (K & Mg) daily x 3 days, dose adjustment, success of initiation and facilitate 1 week discharge supply as clinically indicated.  3. Initiate Tikosyn education video (Call [phone] and  ask for video # 116).  4. Place Enrollment Form on the chart for discharge supply of dofetilide.   Vinnie Level, PharmD., BCPS Clinical Pharmacist Pager [phone]

## 2016-12-12 NOTE — Progress Notes (Signed)
Primary Care Physician: [name], PA-C Referring Physician: Dr. Johney Frame Cardiologist:    Fahad Cisse is a 46 y.o. male with a h/o persistent afib in the past with flecainde working for a while, tried  amiodarone to restore SR, but was not effective.He thinks in persisitence afib since summer of last year. Weight in past thought to be a deterrent to ablation. H/o TMC at 40%. Previously seen by [name] in [facility].  He ws seen by Dr. Johney Frame  and plans put in place for admission for tikosyn. Pt   reported that he may have been on amiodarone as late as early June. He did pick up a refill on 5/21 and it was listed on PCP list of meds when he was seen there June 19th. Pt cannot remember why it was stopped but no longer on drug. Previous amiodarone level was 0.4, 2 weeks ago, however, by our best estimation, he has now been off drug since the end of June which will be greater than 3 months, per Dr. Johney Frame, ok to come in now for tikosyn. Has been dx'ed recently with OSA, but has not yet received equipment to start cpap.  He is on xarelto 20 mg daily for chadsvasc score of at least 2, no missed doses, no benadryl use.  Today, he denies symptoms of palpitations, chest pain, shortness of breath, orthopnea, PND, lower extremity edema, dizziness, presyncope, syncope, or neurologic sequela. The patient is tolerating medications without difficulties and is otherwise without complaint today.   Past Medical History:  Diagnosis Date  . Anxiety 09/02/2014  . Burn   . Esophageal stricture 09/02/2014  . Essential hypertension 09/01/2014  . 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HCC)   . Hypertension   . Morbid obesity with body mass index (BMI) of 45.0 to 49.9 in adult [facility]) 04/05/2016  . Obstructive sleep apnea    awaiting treatment  . Persistent atrial fibrillation (HCC) 09/01/2014   Overview:  CHADS2=1   Past Surgical History:  Procedure Laterality Date  . APPENDECTOMY    . CARDIOVERSION    . KNEE SURGERY    . SKIN  GRAFT      Current Outpatient Prescriptions  Medication Sig Dispense Refill  . lisinopril (PRINIVIL,ZESTRIL) 10 MG tablet Take 10 mg by mouth daily.    . metoprolol tartrate (LOPRESSOR) 50 MG tablet Take 1.5 tablets by mouth 2 (two) times daily.    . nitroGLYCERIN (NITROSTAT) 0.4 MG SL tablet Place 0.4 mg under the tongue every 5 (five) minutes as needed for chest pain. Chest pain    . pantoprazole (PROTONIX) 40 MG tablet Take 40 mg by mouth daily.    Carlena Hurl 20 MG TABS tablet Take 20 mg by mouth every evening.     No current facility-administered medications for this encounter.     No Known Allergies  Social History   Social History  . Marital status: Married    Spouse name: N/A  . Number of children: N/A  . Years of education: N/A   Occupational History  . Not on file.   Social History Main Topics  . Smoking status: Former Smoker    Types: Cigars  . Smokeless tobacco: Never Used     Comment: ocassional cigar  . Alcohol use Yes     Comment: weekends  . Drug use: No  . Sexual activity: Not on file   Other Topics Concern  . 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 Diabetes Paternal Grandfather   . Heart disease Paternal Grandfather     ROS- All systems are reviewed and negative except as per the HPI above  Physical Exam: Vitals:   12/12/16 0913  BP: (!) 122/92  Pulse: 86  Weight: (!) 363 lb (164.7 kg)  Height:  (1.905 m)   Wt Readings from Last 3 Encounters:  12/12/16 (!) 363 lb (164.7 kg)  11/07/16 (!) 354 lb 3.2 oz (160.7 kg)  11/06/16 (!) 352 lb 9.6 oz (159.9 kg)    Labs: Lab Results  Component Value Date   NA 138 11/07/2016   K 4.3 11/07/2016   CL 106 11/07/2016   CO2 25 11/07/2016   GLUCOSE 118 (H) 11/07/2016   BUN 17 11/07/2016   CREATININE 0.91 11/07/2016   CALCIUM 9.0 11/07/2016   MG 2.0 11/07/2016   Lab Results  Component Value Date     INR 1.41 06/05/2016   No results found for: CHOL, HDL, LDLCALC, TRIG   GEN- The patient is well appearing, alert and oriented x 3 today.   Head- normocephalic, atraumatic Eyes-  Sclera clear, conjunctiva pink Ears- hearing intact Oropharynx- clear Neck- supple, no JVP Lymph- no cervical lymphadenopathy Lungs- Clear to ausculation bilaterally, normal work of breathing Heart- irregular rate and rhythm, no murmurs, rubs or gallops, PMI not laterally displaced GI- soft, NT, ND, + BS Extremities- no clubbing, cyanosis, or edema MS- no significant deformity or atrophy Skin- no rash or lesion Psych- euthymic mood, full affect Neuro- strength and sensation are intact  EKG-afib at 86 bpm, qrs int 102 ms, qtc 469 ms Epic records reviewed   Assessment and Plan: 1. Persistent afib  By best estimation, he has been off amiodarone now for greater than 3 months, will proceed for admission No missed doses of xarelto. No benadryl  Labs drawn this am are in line for K+/mag levels for Tikosyn start, 4.5/1.9 respectively Crcl cal at 206.27 PharmD reviewed drugs without issues Hospital is currently without any available beds, he will be notified later today by admissions, when bed available   Vannessa Godown [facility] [address] [place], [place] [address] [phone]

## 2016-12-12 NOTE — H&P (Signed)
Primary Care Physician: Jesus Height, PA-C Referring Physician: Dr. Johney Barnes Cardiologist:    Jesus Barnes is a 46 y.o. male with a h/o persistent afib in the past with flecainde working for a while, tried  amiodarone to restore SR, but was not effective.He thinks in persisitence afib since summer of last year. Weight in past thought to be a deterrent to ablation. H/o TMC at 40%. Previously seen by Jesus Barnes in [facility].  He ws seen by Dr. Johney Barnes  and plans put in place for admission for tikosyn. Pt   reported that he may have been on amiodarone as late as early June. He did pick up a refill on 5/21 and it was listed on PCP list of meds when he was seen there June 19th. Pt cannot remember why it was stopped but no longer on drug. Previous amiodarone level was 0.4, 2 weeks ago, however, by our best estimation, he has now been off drug since the end of June which will be greater than 3 months, per Dr. Johney Barnes, ok to come in now for tikosyn. Has been dx'ed recently with OSA, but has not yet received equipment to start cpap.  He is on xarelto 20 mg daily for chadsvasc score of at least 2, no missed doses, no benadryl use.  Today, he denies symptoms of palpitations, chest pain, shortness of breath, orthopnea, PND, lower extremity edema, dizziness, presyncope, syncope, or neurologic sequela. The patient is tolerating medications without difficulties and is otherwise without complaint today.       Past Medical History:  Diagnosis Date  . Anxiety 09/02/2014  . Burn   . Esophageal stricture 09/02/2014  . Essential hypertension 09/01/2014  . 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HCC)   . Hypertension   . Morbid obesity with body mass index (BMI) of 45.0 to 49.9 in adult [facility]) 04/05/2016  . Obstructive sleep apnea    awaiting treatment  . Persistent atrial fibrillation (HCC) 09/01/2014   Overview:  CHADS2=1        Past Surgical History:  Procedure Laterality Date  . APPENDECTOMY    . CARDIOVERSION    . KNEE  SURGERY    . SKIN GRAFT            Current Outpatient Prescriptions  Medication Sig Dispense Refill  . lisinopril (PRINIVIL,ZESTRIL) 10 MG tablet Take 10 mg by mouth daily.    . metoprolol tartrate (LOPRESSOR) 50 MG tablet Take 1.5 tablets by mouth 2 (two) times daily.    . nitroGLYCERIN (NITROSTAT) 0.4 MG SL tablet Place 0.4 mg under the tongue every 5 (five) minutes as needed for chest pain. Chest pain    . pantoprazole (PROTONIX) 40 MG tablet Take 40 mg by mouth daily.    Jesus Barnes 20 MG TABS tablet Take 20 mg by mouth every evening.     No current facility-administered medications for this encounter.     No Known Allergies  Social History        Social History  . Marital status: Married    Spouse name: N/A  . Number of children: N/A  . Years of education: N/A      Occupational History  . Not on file.         Social History Main Topics  . Smoking status: Former Smoker    Types: Cigars  . Smokeless tobacco: Never Used     Comment: ocassional cigar  . Alcohol use Yes     Comment: weekends  . Drug use: No  . Sexual  activity: Not on file       Other Jesus Barnes Concern  . 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 Diabetes Paternal Grandfather   . Heart disease Paternal Grandfather     ROS- All systems are reviewed and negative except as per the HPI above  Physical Exam:    Vitals:   12/12/16 0913  BP: (!) 122/92  Pulse: 86  Weight: (!) 363 lb (164.7 kg)  Barnes:  (1.905 m)      Wt Readings from Last 3 Encounters:  12/12/16 (!) 363 lb (164.7 kg)  11/07/16 (!) 354 lb 3.2 oz (160.7 kg)  11/06/16 (!) 352 lb 9.6 oz (159.9 kg)    Labs: Recent Labs       Lab Results  Component Value Date   NA 138 11/07/2016   K 4.3 11/07/2016   CL 106 11/07/2016   CO2 25 11/07/2016   GLUCOSE 118 (H)  11/07/2016   BUN 17 11/07/2016   CREATININE 0.91 11/07/2016   CALCIUM 9.0 11/07/2016   MG 2.0 11/07/2016     Recent Labs       Lab Results  Component Value Date   INR 1.41 06/05/2016     Recent Labs  No results found for: CHOL, HDL, LDLCALC, TRIG     GEN- The patient is well appearing, alert and oriented x 3 today.   Head- normocephalic, atraumatic Eyes-  Sclera clear, conjunctiva pink Ears- hearing intact Oropharynx- clear Neck- supple, no JVP Lymph- no cervical lymphadenopathy Lungs- Clear to ausculation bilaterally, normal work of breathing Heart- irregular rate and rhythm, no murmurs, rubs or gallops, PMI not laterally displaced GI- soft, NT, ND, + BS Extremities- no clubbing, cyanosis, or edema MS- no significant deformity or atrophy Skin- no rash or lesion Psych- euthymic mood, full affect Neuro- strength and sensation are intact  EKG-afib at 86 bpm, qrs int 102 ms, qtc 469 ms Epic records reviewed   Assessment and Plan: 1. Persistent afib  By best estimation, he has been off amiodarone now for greater than 3 months, will proceed for admission No missed doses of xarelto. No benadryl  Labs drawn this am are in line for K+/mag levels for Tikosyn start, 4.5/1.9 respectively Crcl cal at 206.27 PharmD reviewed drugs without issues  Jesus Barnes 7:05 PM

## 2016-12-13 DIAGNOSIS — G4733 Obstructive sleep apnea (adult) (pediatric): Secondary | ICD-10-CM

## 2016-12-13 LAB — BASIC METABOLIC PANEL
Anion gap: 7 (ref 5–15)
BUN: 10 mg/dL (ref 6–20)
CALCIUM: 8.5 mg/dL — AB (ref 8.9–10.3)
CO2: 28 mmol/L (ref 22–32)
CREATININE: 0.91 mg/dL (ref 0.61–1.24)
Chloride: 103 mmol/L (ref 101–111)
GFR calc Af Amer: >60 mL/min (ref >60)
GLUCOSE: 91 mg/dL (ref 65–99)
Potassium: 4 mmol/L (ref 3.5–5.1)
SODIUM: 138 mmol/L (ref 135–145)

## 2016-12-13 LAB — HIV ANTIBODY (ROUTINE TESTING W REFLEX): HIV SCREEN 4TH GENERATION: NONREACTIVE

## 2016-12-13 LAB — MAGNESIUM: MAGNESIUM: 2 mg/dL (ref 1.7–2.4)

## 2016-12-13 MED ORDER — HYDROCODONE-ACETAMINOPHEN 7.5-325 MG PO TABS
1.0000 | ORAL_TABLET | Freq: Four times a day (QID) | ORAL | Status: DC | PRN
  Administered 2016-12-13 – 2016-12-14 (×2): 1 via ORAL
  Filled 2016-12-13 (×2): qty 1

## 2016-12-13 NOTE — Progress Notes (Signed)
   Electrophysiology Rounding Note  Patient Name: Jesus Barnes Date of Encounter: 12/13/2016  Electrophysiologist: Ailee Pates   Subjective   The patient is doing well today.  At this time, the patient denies chest pain, shortness of breath, or any new concerns.  Inpatient Medications    Scheduled Meds: . dofetilide  500 mcg Oral BID  . lisinopril  10 mg Oral Daily  . metoprolol tartrate  75 mg Oral BID  . pantoprazole  40 mg Oral Daily  . rivaroxaban  20 mg Oral QPM  . sodium chloride flush  3 mL Intravenous Q12H   Continuous Infusions: . sodium chloride     PRN Meds: sodium chloride, nitroGLYCERIN, sodium chloride flush   Vital Signs    Vitals:   12/12/16 1850 12/12/16 2105 12/13/16 0525  BP: (!) 135/102 (!) 145/91 130/90  Pulse: 89 85 90  Resp: 18 20   Temp: 98.6 F (37 C) 97.8 F (36.6 C) 97.8 F (36.6 C)  TempSrc: Oral Oral Oral  SpO2: 98% 99% 97%  Weight: (!) 359 lb 11.2 oz (163.2 kg)  (!) 356 lb 14.4 oz (161.9 kg)  Height:  (1.905 m)     No intake or output data in the 24 hours ending 12/13/16 0836 Filed Weights   12/12/16 1850 12/13/16 0525  Weight: (!) 359 lb 11.2 oz (163.2 kg) (!) 356 lb 14.4 oz (161.9 kg)    Physical Exam    GEN- The patient is obese appearing, alert and oriented x 3 today.   Head- normocephalic, atraumatic Eyes-  Sclera clear, conjunctiva pink Ears- hearing intact Oropharynx- clear Neck- supple Lungs- Clear to ausculation bilaterally, normal work of breathing Heart- Irregular rate and rhythm, no murmurs, rubs or gallops GI- soft, NT, ND, + BS Extremities- no clubbing, cyanosis, or edema Skin- no rash or lesion Psych- euthymic mood, full affect Neuro- strength and sensation are intact  Labs    Basic Metabolic Panel  Recent Labs  12/12/16 2007 12/13/16 0258  NA 133* 138  K 4.0 4.0  CL 102 103  CO2 27 28  GLUCOSE 92 91  BUN 11 10  CREATININE 0.87 0.91  CALCIUM 8.6* 8.5*  MG 2.0 2.0     Telemetry      Atrial fibrillation (personally reviewed)  Radiology    No results found.   Patient Profile     Jesus Barnes is a 46 y.o. male admitted for Tikosyn load  Assessment & Plan    1.  Persistent atrial fibrillation Admitted for Tikosyn K, Mg, QTc stable (manual measurement ok) Plan DCCV tomorrow if still in AF Continue anticoagulation long term for CHADS2VASC of 1  2.  HTN Stable No change required today  3.  Obesity Body mass index is 44.61 kg/m. Weight loss encouraged   4. OSA Plans for CPAP to begin on friday  Signed, Gypsy Balsam, NP  12/13/2016, 8:36 AM   I have seen, examined the patient, and reviewed the above assessment and plan.  Changes to above are made where necessary.  On exam, iRRR.  NPO after midnight for possible cardioversion tomorrow  Co Sign: Hillis Range, MD 12/13/2016 8:40 AM

## 2016-12-13 NOTE — Discharge Instructions (Addendum)
You have an appointment set up with the Atrial Fibrillation Clinic.  Multiple studies have shown that being followed by a dedicated atrial fibrillation clinic in addition to the standard care you receive from your other physicians improves health. We believe that enrollment in the atrial fibrillation clinic will allow us to better care for you.  ° °The phone number to the Atrial Fibrillation Clinic is [phone]. The clinic is staffed Monday through Friday from 8:30am to 5pm. ° °Parking Directions: The clinic is located in the Heart and Vascular Building connected to [place] [facility]. °1)From [address] turn on to [address] and go to the 3rd entrance  (Heart and Vascular entrance) on the right. °2)Look to the right for Heart &Vascular Parking Garage. °3)A code for the entrance is required please call the clinic to receive this.   °4)Take the elevators to the 1st floor. Registration is in the room with the glass walls at the end of the hallway. ° °If you have any trouble parking or locating the clinic, please don’t hesitate to call [phone]. ° °Information on my medicine - XARELTO® (Rivaroxaban) ° °Why was Xarelto® prescribed for you? °Xarelto® was prescribed for you to reduce the risk of a blood clot forming that can cause a stroke if you have a medical condition called atrial fibrillation (a type of irregular heartbeat). ° °What do you need to know about xarelto® ? °Take your Xarelto® ONCE DAILY at the same time every day with your evening meal. °If you have difficulty swallowing the tablet whole, you may crush it and mix in applesauce just prior to taking your dose. ° °Take Xarelto® exactly as prescribed by your doctor and DO NOT stop taking Xarelto® without talking to the doctor who prescribed the medication.  Stopping without other stroke prevention medication to take the place of Xarelto® may increase your risk of developing a clot that causes a stroke.  Refill your prescription before you  run out. ° °After discharge, you should have regular check-up appointments with your healthcare provider that is prescribing your Xarelto®.  In the future your dose may need to be changed if your kidney function or weight changes by a significant amount. ° °What do you do if you miss a dose? °If you are taking Xarelto® ONCE DAILY and you miss a dose, take it as soon as you remember on the same day then continue your regularly scheduled once daily regimen the next day. Do not take two doses of Xarelto® at the same time or on the same day.  ° °Important Safety Information °A possible side effect of Xarelto® is bleeding. You should call your healthcare provider right away if you experience any of the following: °? Bleeding from an injury or your nose that does not stop. °? Unusual colored urine (red or dark brown) or unusual colored stools (red or black). °? Unusual bruising for unknown reasons. °? A serious fall or if you hit your head (even if there is no bleeding). ° °Some medicines may interact with Xarelto® and might increase your risk of bleeding while on Xarelto®. To help avoid this, consult your healthcare provider or pharmacist prior to using any new prescription or non-prescription medications, including herbals, vitamins, non-steroidal anti-inflammatory drugs (NSAIDs) and supplements. ° °This website has more information on Xarelto®: www.xarelto.com. ° °

## 2016-12-13 NOTE — Progress Notes (Signed)
QTC after initial dose is 513. Notified on call Cardioliogy. Advised to hold next dose and notify MD in the am.

## 2016-12-13 NOTE — Care Management Note (Signed)
Case Management Note  Patient Details  Name: Khristopher Kapaun MRN: [id number] Date of Birth: 12-13-1970  Subjective/Objective: Pt Presented for Tikosyn Load. Benefits Check Completed.                   Action/Plan: S/W TARA  @ [facility] # [phone]    1. TIKOSYN 500 MCG BID CAPSULE  COVER- NOT COVER  PRIOR APPROVAL- YES # [phone] FOR EXCEPTION    2. DOFETILIDE 500 MCG BID CAPSULE  COVER- YES  CO-PAY- ZERO DOLLARS  PRIOR APPROVAL- YES #  [phone]     PREFERRED PHARMACY : [facility]   CM will assist with Rx for 7 day supply no refills from main pharmacy. No further needs from CM @ this time.   Expected Discharge Date:                  Expected Discharge Plan:  Home/Self Care  In-House Referral:  NA  Discharge planning Services  CM Consult, Medication Assistance  Post Acute Care Choice:  NA Choice offered to:  NA  DME Arranged:  N/A DME Agency:  NA  HH Arranged:  NA HH Agency:  NA  Status of Service:  Completed, signed off  If discussed at Long Length of Stay Meetings, dates discussed:    Additional Comments:  Gala Lewandowsky, RN 12/13/2016, 3:15 PM

## 2016-12-14 ENCOUNTER — Encounter (HOSPITAL_COMMUNITY)
Admission: AD | Disposition: A | Discharge status: Home / Self Care | Payer: Self-pay | Source: Ambulatory Visit | Attending: Cardiology

## 2016-12-14 ENCOUNTER — Encounter (HOSPITAL_COMMUNITY): Payer: Self-pay

## 2016-12-14 ENCOUNTER — Inpatient Hospital Stay (HOSPITAL_COMMUNITY)
Admission: AD | Disposition: A | Discharge status: Home / Self Care | Payer: Self-pay | Source: Ambulatory Visit | Attending: Cardiology

## 2016-12-14 DIAGNOSIS — I10 Essential (primary) hypertension: Secondary | ICD-10-CM

## 2016-12-14 DIAGNOSIS — I4891 Unspecified atrial fibrillation: Secondary | ICD-10-CM

## 2016-12-14 HISTORY — PX: CARDIOVERSION: EP1203

## 2016-12-14 LAB — BASIC METABOLIC PANEL
Anion gap: 10 (ref 5–15)
BUN: 10 mg/dL (ref 6–20)
CALCIUM: 8.9 mg/dL (ref 8.9–10.3)
CO2: 28 mmol/L (ref 22–32)
CREATININE: 0.91 mg/dL (ref 0.61–1.24)
Chloride: 101 mmol/L (ref 101–111)
Glucose, Bld: 85 mg/dL (ref 65–99)
Potassium: 4 mmol/L (ref 3.5–5.1)
SODIUM: 139 mmol/L (ref 135–145)

## 2016-12-14 LAB — MAGNESIUM: MAGNESIUM: 2 mg/dL (ref 1.7–2.4)

## 2016-12-14 SURGERY — CARDIOVERSION
Anesthesia: General

## 2016-12-14 SURGERY — CARDIOVERSION (CATH LAB)
Anesthesia: LOCAL

## 2016-12-14 MED ORDER — SODIUM CHLORIDE 0.9 % IV SOLN: 250.0000 mL | INTRAVENOUS | Status: DC | PRN

## 2016-12-14 MED ORDER — SODIUM CHLORIDE 0.9% FLUSH: 3.0000 mL | INTRAVENOUS | Status: DC | PRN

## 2016-12-14 MED ORDER — MIDAZOLAM HCL 5 MG/5ML IJ SOLN
INTRAMUSCULAR | Status: AC
  Filled 2016-12-14: qty 5

## 2016-12-14 MED ORDER — MIDAZOLAM HCL 2 MG/2ML IJ SOLN
INTRAMUSCULAR | Status: DC | PRN
  Administered 2016-12-14: 1 mg via INTRAVENOUS
  Administered 2016-12-14 (×2): 2 mg via INTRAVENOUS
  Administered 2016-12-14: 3 mg via INTRAVENOUS
  Administered 2016-12-14: 1 mg via INTRAVENOUS

## 2016-12-14 MED ORDER — ONDANSETRON HCL 4 MG/2ML IJ SOLN: 4.0000 mg | Freq: Four times a day (QID) | INTRAMUSCULAR | Status: DC | PRN

## 2016-12-14 MED ORDER — LORATADINE 10 MG PO TABS
10.0000 mg | ORAL_TABLET | Freq: Every day | ORAL | Status: DC
  Administered 2016-12-14 – 2016-12-15 (×2): 10 mg via ORAL
  Filled 2016-12-14 (×2): qty 1

## 2016-12-14 MED ORDER — ACETAMINOPHEN 325 MG PO TABS: 650.0000 mg | ORAL_TABLET | ORAL | Status: DC | PRN

## 2016-12-14 MED ORDER — FENTANYL CITRATE (PF) 100 MCG/2ML IJ SOLN
INTRAMUSCULAR | Status: DC | PRN
  Administered 2016-12-14 (×3): 25 ug via INTRAVENOUS

## 2016-12-14 MED ORDER — SODIUM CHLORIDE 0.9% FLUSH
3.0000 mL | Freq: Two times a day (BID) | INTRAVENOUS | Status: DC
  Administered 2016-12-14: 3 mL via INTRAVENOUS

## 2016-12-14 MED ORDER — FENTANYL CITRATE (PF) 100 MCG/2ML IJ SOLN
INTRAMUSCULAR | Status: AC
  Filled 2016-12-14: qty 2

## 2016-12-14 SURGICAL SUPPLY — 1 items: PAD DEFIB LIFELINK (PAD) ×2 IMPLANT

## 2016-12-14 NOTE — Progress Notes (Signed)
   Electrophysiology Rounding Note  Patient Name: Jesus Barnes Date of Encounter: 12/14/2016  Electrophysiologist: Bayard More   Subjective   The patient denies chest pain or shortness of breath, +headache this morning, states feels like sinus headache.   Inpatient Medications    Scheduled Meds: . dofetilide  500 mcg Oral BID  . lisinopril  10 mg Oral Daily  . metoprolol tartrate  75 mg Oral BID  . pantoprazole  40 mg Oral Daily  . rivaroxaban  20 mg Oral QPM  . sodium chloride flush  3 mL Intravenous Q12H   Continuous Infusions: . sodium chloride     PRN Meds: sodium chloride, HYDROcodone-acetaminophen, nitroGLYCERIN, sodium chloride flush   Vital Signs    Vitals:   12/13/16 1001 12/13/16 1337 12/13/16 2141 12/14/16 0532  BP: (!) 119/93 112/89 127/89 (!) 140/104  Pulse: 83 66 75 69  Resp:    18  Temp:  97.7 F (36.5 C) 98.6 F (37 C) 97.9 F (36.6 C)  TempSrc:  Oral Oral Oral  SpO2:  100% 99% 98%  Weight:      Height:        Intake/Output Summary (Last 24 hours) at 12/14/16 0728 Last data filed at 12/14/16 0000  Gross per 24 hour  Intake             1207 ml  Output                0 ml  Net             1207 ml   Filed Weights   12/12/16 1850 12/13/16 0525  Weight: (!) 359 lb 11.2 oz (163.2 kg) (!) 356 lb 14.4 oz (161.9 kg)    Physical Exam    GEN- The patient is obese appearing, alert and oriented x 3 today.   Head- normocephalic, atraumatic Eyes-  Sclera clear, conjunctiva pink Ears- hearing intact Oropharynx- clear Neck- supple Lungs- Clear to ausculation bilaterally, normal work of breathing Heart- Irregular rate and rhythm, no murmurs, rubs or gallops GI- soft, NT, ND, + BS Extremities- no clubbing, cyanosis, or edema Skin- no rash or lesion Psych- euthymic mood, full affect Neuro- strength and sensation are intact  Labs    Basic Metabolic Panel  Recent Labs  12/13/16 0258 12/14/16 0416  NA 138 139  K 4.0 4.0  CL 103 101  CO2 28 28    GLUCOSE 91 85  BUN 10 10  CREATININE 0.91 0.91  CALCIUM 8.5* 8.9  MG 2.0 2.0    Telemetry    Rate controlled AF (personally reviewed)  Radiology    No results found.   Patient Profile     Jesus Barnes is a 46 y.o. male admitted for Tikosyn load  Assessment & Plan    1.  Persistent atrial fibrillation Admitted for Tikosyn BMET, Mg, QTc stable DCCV today Continue anticoagulation for CHADS2VASC of 1  2.  HTN Stable No change required today  3.  Obesity Weight loss encouarged  4.  OSA Plans for CPAP to begin on Friday   Plan discharge tomorrow  Signed, Amber Seiler, NP  12/14/2016, 7:28 AM   I have seen, examined the patient, and reviewed the above assessment and plan. On exam, iRRR.  Changes to above are made where necessary.   Anticipate cardioversion later today.  Hopefully home tomorrow am.  Qt is stable  Co Sign: Myha Arizpe, MD 12/14/2016 7:58 AM   

## 2016-12-14 NOTE — H&P (View-Only) (Signed)
   Electrophysiology Rounding Note  Patient Name: Jesus WojtaszekDate of Encounter: 12/14/2016  Electrophysiologist: Jasmia Angst   Subjective   The patient denies chest pain or shortness of breath, +headache this morning, states feels like sinus headache.   Inpatient Medications    Scheduled Meds: . dofetilide  500 mcg Oral BID  . lisinopril  10 mg Oral Daily  . metoprolol tartrate  75 mg Oral BID  . pantoprazole  40 mg Oral Daily  . rivaroxaban  20 mg Oral QPM  . sodium chloride flush  3 mL Intravenous Q12H   Continuous Infusions: . sodium chloride     PRN Meds: sodium chloride, HYDROcodone-acetaminophen, nitroGLYCERIN, sodium chloride flush   Vital Signs    Vitals:   12/13/16 1001 12/13/16 1337 12/13/16 2141 12/14/16 0532  BP: (!) 119/93 112/89 127/89 (!) 140/104  Pulse: 83 66 75 69  Resp:    18  Temp:  97.7 F (36.5 C) 98.6 F (37 C) 97.9 F (36.6 C)  TempSrc:  Oral Oral Oral  SpO2:  100% 99% 98%  Weight:      Height:        Intake/Output Summary (Last 24 hours) at 12/14/16 0728 Last data filed at 12/14/16 0000  Gross per 24 hour  Intake             1207 ml  Output                0 ml  Net             1207 ml   Filed Weights   12/12/16 1850 12/13/16 0525  Weight: (!) 359 lb 11.2 oz (163.2 kg) (!) 356 lb 14.4 oz (161.9 kg)    Physical Exam    GEN- The patient is obese appearing, alert and oriented x 3 today.   Head- normocephalic, atraumatic Eyes-  Sclera clear, conjunctiva pink Ears- hearing intact Oropharynx- clear Neck- supple Lungs- Clear to ausculation bilaterally, normal work of breathing Heart- Irregular rate and rhythm, no murmurs, rubs or gallops GI- soft, NT, ND, + BS Extremities- no clubbing, cyanosis, or edema Skin- no rash or lesion Psych- euthymic mood, full affect Neuro- strength and sensation are intact  Labs    Basic Metabolic Panel  Recent Labs  12/13/16 0258 12/14/16 0416  NA 138 139  K 4.0 4.0  CL 103 101  CO2 28 28    GLUCOSE 91 85  BUN 10 10  CREATININE 0.91 0.91  CALCIUM 8.5* 8.9  MG 2.0 2.0    Telemetry    Rate controlled AF (personally reviewed)  Radiology    No results found.   Patient Profile     Jesus Barnes is a 46 y.o. male admitted for Tikosyn load  Assessment & Plan    1.  Persistent atrial fibrillation Admitted for Tikosyn BMET, Mg, QTc stable DCCV today Continue anticoagulation for CHADS2VASC of 1  2.  HTN Stable No change required today  3.  Obesity Weight loss encouarged  4.  OSA Plans for CPAP to begin on Friday   Plan discharge tomorrow  Signed, Gypsy Balsam, NP  12/14/2016, 7:28 AM   I have seen, examined the patient, and reviewed the above assessment and plan. On exam, iRRR.  Changes to above are made where necessary.   Anticipate cardioversion later today.  Hopefully home tomorrow am.  Qt is stable  Co Sign: Hillis Range, MD 12/14/2016 7:58 AM

## 2016-12-14 NOTE — Discharge Summary (Signed)
ELECTROPHYSIOLOGY PROCEDURE DISCHARGE SUMMARY    Patient ID: Jesus Barnes,  MRN: [id number], DOB/AGE: 48-22-1972 48 y.o.  Admit date: 12/12/2016 Discharge date: 12/15/2016  Primary Care Physician: [name], PA-C Electrophysiologist: Allred  Primary Discharge Diagnosis:  1.  Persistent atrial fibrillation status post Tikosyn loading this admission  Secondary Discharge Diagnosis:  1.  Obesity 2.  OSA 3.  HTN  No Known Allergies   Procedures This Admission:  1.  Tikosyn loading 2.  Direct current cardioversion on 12/14/16 by Dr Ladona Ridgel which successfully restored SR.  There were no early apparent complications.   Brief HPI: Jesus Barnes is a 46 y.o. male with a past medical history as noted above.  They were referred to EP in the outpatient setting for treatment options of atrial fibrillation.  Risks, benefits, and alternatives to Tikosyn were reviewed with the patient who wished to proceed.    Hospital Course:  The patient was admitted and Tikosyn was initiated.  Renal function and electrolytes were followed during the hospitalization.  Their QTc remained stable.  On 12/14/16 they underwent direct current cardioversion which restored sinus rhythm.  They were monitored until discharge on telemetry which demonstrated sinus rhyythm.  On the day of discharge, they were examined by Dr Ladona Ridgel who considered them stable for discharge to home.  Follow-up has been arranged with [facility] pharmacists in 1 week and with Dr Johney Frame in 6 weeks.   Physical Exam: Vitals:   12/14/16 1115 12/14/16 1504 12/14/16 2146 12/15/16 0627  BP: 113/83 113/73 112/78 116/80  Pulse: (!) 55 (!) 49 (!) 50 (!) 56  Resp: 15     Temp:  97.6 F (36.4 C) 97.9 F (36.6 C) 98.3 F (36.8 C)  TempSrc:  Oral Oral Oral  SpO2: 93% 98% 99% 99%  Weight:    (!) 350 lb 3.2 oz (158.8 kg)  Height:        GEN- The patient is obese appearing, alert and oriented x 3 today.   HEENT: normocephalic, atraumatic; sclera  clear, conjunctiva pink; hearing intact; oropharynx clear; neck supple  Lungs- Clear to ausculation bilaterally, normal work of breathing.  No wheezes, rales, rhonchi Heart- Regular rate and rhythm, no murmurs, rubs or gallops GI- soft, non-tender, non-distended, bowel sounds present Extremities- no clubbing, cyanosis, or edema MS- no significant deformity or atrophy Skin- warm and dry, no rash or lesion Psych- euthymic mood, full affect Neuro- strength and sensation are intact   Labs:   Lab Results  Component Value Date   WBC 11.5 (H) 06/05/2016   HGB 15.0 06/05/2016   HCT 44.0 06/05/2016   MCV 88.0 06/05/2016   PLT 230 06/05/2016     Recent Labs Lab 12/14/16 0416  NA 139  K 4.0  CL 101  CO2 28  BUN 10  CREATININE 0.91  CALCIUM 8.9  GLUCOSE 85     Discharge Medications:  Allergies as of 12/15/2016   No Known Allergies     Medication List    TAKE these medications   dofetilide 500 MCG capsule Commonly known as:  TIKOSYN Take 1 capsule (500 mcg total) by mouth 2 (two) times daily.   lisinopril 2.5 MG tablet Commonly known as:  PRINIVIL,ZESTRIL Take 2.5 mg by mouth daily.   metoprolol tartrate 50 MG tablet Commonly known as:  LOPRESSOR Take 75 tablets by mouth 2 (two) times daily.   nitroGLYCERIN 0.4 MG SL tablet Commonly known as:  NITROSTAT Place 0.4 mg under the tongue every 5 (five)  minutes as needed for chest pain. Chest pain   pantoprazole 40 MG tablet Commonly known as:  PROTONIX Take 40 mg by mouth daily.   XARELTO 20 MG Tabs tablet Generic drug:  rivaroxaban Take 20 mg by mouth every evening.       Disposition:  Discharge Instructions    Diet - low sodium heart healthy    Complete by:  As directed    Increase activity slowly    Complete by:  As directed      Follow-up Information    [facility] Follow up on 12/25/2016.   Specialty:  Cardiology Why:  at 9:30AM Contact information: [address] [id number] mc [address] [place] [address] [phone]       Hillis Range, MD Follow up on 01/31/2017.   Specialty:  Cardiology Why:  at 3:30PM  Contact information: [address] [place] [address] [phone]           Duration of Discharge Encounter: Greater than 30 minutes including physician time.  Signed, Gypsy Balsam, NP 12/15/2016 7:22 AM

## 2016-12-14 NOTE — Interval H&P Note (Signed)
History and Physical Interval Note:  12/14/2016 10:31 AM  Jesus Barnes  has presented today for surgery, with the diagnosis of afib  The various methods of treatment have been discussed with the patient and family. After consideration of risks, benefits and other options for treatment, the patient has consented to  Procedure(s): CARDIOVERSION (N/A) as a surgical intervention .  The patient's history has been reviewed, patient examined, no change in status, stable for surgery.  I have reviewed the patient's chart and labs.  Questions were answered to the patient's satisfaction.     Lewayne Bunting

## 2016-12-15 ENCOUNTER — Telehealth: Payer: Self-pay | Admitting: Internal Medicine

## 2016-12-15 ENCOUNTER — Encounter (HOSPITAL_COMMUNITY): Payer: Self-pay | Admitting: *Deleted

## 2016-12-15 ENCOUNTER — Telehealth (HOSPITAL_COMMUNITY): Payer: Self-pay | Admitting: *Deleted

## 2016-12-15 LAB — BASIC METABOLIC PANEL
ANION GAP: 3 — AB (ref 5–15)
BUN: 7 mg/dL (ref 6–20)
CALCIUM: 8.6 mg/dL — AB (ref 8.9–10.3)
CHLORIDE: 101 mmol/L (ref 101–111)
CO2: 30 mmol/L (ref 22–32)
Creatinine, Ser: 0.98 mg/dL (ref 0.61–1.24)
GFR calc non Af Amer: >60 mL/min (ref >60)
GLUCOSE: 90 mg/dL (ref 65–99)
POTASSIUM: 4.1 mmol/L (ref 3.5–5.1)
Sodium: 134 mmol/L — ABNORMAL LOW (ref 135–145)

## 2016-12-15 LAB — MAGNESIUM: Magnesium: 2.1 mg/dL (ref 1.7–2.4)

## 2016-12-15 MED ORDER — DOFETILIDE 500 MCG PO CAPS: 500.0000 ug | ORAL_CAPSULE | Freq: Two times a day (BID) | ORAL | 3 refills | Status: DC

## 2016-12-15 MED FILL — Midazolam HCl Inj 5 MG/5ML (Base Equivalent): INTRAMUSCULAR | Qty: 9 | Status: AC

## 2016-12-15 NOTE — Telephone Encounter (Signed)
°  New Prob  Pt requesting paperwork stating he is clear to return to work post hospital stay. Please fax to [phone] Attn: Mammie Lorenzo.

## 2016-12-15 NOTE — Progress Notes (Signed)
Tikosyn  1 tablet BID #14 for 7 day supply dispensed to patient for discharge from [facility] pharmacy.  Ruben Im, PharmD Clinical Pharmacist 12/15/2016 9:54 AM

## 2016-12-15 NOTE — Telephone Encounter (Signed)
Letter sent to employer

## 2016-12-15 NOTE — Telephone Encounter (Signed)
preauthorization approved for dofetilide. PA #[id number] approved 12/15/2016-12/16/2018

## 2016-12-25 ENCOUNTER — Encounter (HOSPITAL_COMMUNITY): Payer: Self-pay | Admitting: Nurse Practitioner

## 2016-12-25 ENCOUNTER — Ambulatory Visit (HOSPITAL_COMMUNITY)
Admission: RE | Admit: 2016-12-25 | Discharge: 2016-12-25 | Disposition: A | Discharge status: Home / Self Care | Payer: 59 | Source: Ambulatory Visit | Attending: Nurse Practitioner | Admitting: Nurse Practitioner

## 2016-12-25 VITALS — BP 108/76 | HR 92 | Ht 75.0 in | Wt 357.8 lb

## 2016-12-25 DIAGNOSIS — G4733 Obstructive sleep apnea (adult) (pediatric): Secondary | ICD-10-CM | POA: Diagnosis not present

## 2016-12-25 DIAGNOSIS — Z87891 Personal history of nicotine dependence: Secondary | ICD-10-CM | POA: Insufficient documentation

## 2016-12-25 DIAGNOSIS — Z6841 Body Mass Index (BMI) 40.0 and over, adult: Secondary | ICD-10-CM | POA: Diagnosis not present

## 2016-12-25 DIAGNOSIS — I481 Persistent atrial fibrillation: Secondary | ICD-10-CM | POA: Insufficient documentation

## 2016-12-25 DIAGNOSIS — Z7901 Long term (current) use of anticoagulants: Secondary | ICD-10-CM | POA: Diagnosis not present

## 2016-12-25 DIAGNOSIS — Z79899 Other long term (current) drug therapy: Secondary | ICD-10-CM | POA: Insufficient documentation

## 2016-12-25 DIAGNOSIS — I4819 Other persistent atrial fibrillation: Secondary | ICD-10-CM

## 2016-12-25 DIAGNOSIS — I10 Essential (primary) hypertension: Secondary | ICD-10-CM | POA: Diagnosis not present

## 2016-12-25 LAB — BASIC METABOLIC PANEL
Anion gap: 7 (ref 5–15)
BUN: 11 mg/dL (ref 6–20)
CHLORIDE: 106 mmol/L (ref 101–111)
CO2: 25 mmol/L (ref 22–32)
CREATININE: 0.87 mg/dL (ref 0.61–1.24)
Calcium: 8.9 mg/dL (ref 8.9–10.3)
GFR calc non Af Amer: >60 mL/min (ref >60)
Glucose, Bld: 94 mg/dL (ref 65–99)
POTASSIUM: 4 mmol/L (ref 3.5–5.1)
Sodium: 138 mmol/L (ref 135–145)

## 2016-12-25 LAB — MAGNESIUM: Magnesium: 2 mg/dL (ref 1.7–2.4)

## 2016-12-25 LAB — CBC
HEMATOCRIT: 45.8 % (ref 39.0–52.0)
HEMOGLOBIN: 16.7 g/dL (ref 13.0–17.0)
MCH: 32.4 pg (ref 26.0–34.0)
MCHC: 36.5 g/dL — ABNORMAL HIGH (ref 30.0–36.0)
MCV: 88.8 fL (ref 78.0–100.0)
Platelets: 242 10*3/uL (ref 150–400)
RBC: 5.16 MIL/uL (ref 4.22–5.81)
RDW: 12.9 % (ref 11.5–15.5)
WBC: 6.8 10*3/uL (ref 4.0–10.5)

## 2016-12-25 NOTE — Patient Instructions (Addendum)
Cardioversion scheduled for Friday, October 19th  - Arrive at the [employer] [facility] and go to admitting at 12:30pm  -Do not eat or drink anything after midnight the night prior to your procedure.  - Take all your medication with a sip of water prior to arrival.  - You will not be able to drive home after your procedure.
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
## 2016-12-29 ENCOUNTER — Encounter (HOSPITAL_COMMUNITY): Payer: Self-pay | Admitting: Cardiovascular Disease

## 2016-12-29 ENCOUNTER — Other Ambulatory Visit (HOSPITAL_COMMUNITY): Payer: Self-pay | Admitting: *Deleted

## 2016-12-29 ENCOUNTER — Ambulatory Visit (HOSPITAL_COMMUNITY): Admission: RE | Admit: 2016-12-29 | Payer: 59 | Source: Ambulatory Visit | Admitting: Cardiovascular Disease

## 2016-12-29 ENCOUNTER — Encounter (HOSPITAL_COMMUNITY): Admission: RE | Payer: Self-pay | Source: Ambulatory Visit

## 2016-12-29 ENCOUNTER — Telehealth (HOSPITAL_COMMUNITY): Payer: Self-pay | Admitting: *Deleted

## 2016-12-29 SURGERY — CARDIOVERSION
Anesthesia: Monitor Anesthesia Care

## 2016-12-29 NOTE — Telephone Encounter (Signed)
Patient called in yesterday afternoon stating he felt to be back in normal rhythm. Requested he go to his PCP office to have EKG prior to canceling cardioversion. EKG received this morning SB HR 47. QT reading at 506 -- will have Dr. Johney FrameAllred review as last reading prior to discharge was 463 with HR in the 60s. Pt states feeling much improved in normal rhythm. Cardioversion canceled for today - [facility]  Spoke with Dr. Johney FrameAllred -- continue current medication regimen.

## 2017-01-08 ENCOUNTER — Ambulatory Visit (HOSPITAL_COMMUNITY): Payer: Self-pay | Admitting: Nurse Practitioner

## 2017-01-22 ENCOUNTER — Encounter: Payer: Self-pay | Admitting: Internal Medicine

## 2017-01-31 ENCOUNTER — Telehealth: Payer: Self-pay | Admitting: Pharmacist

## 2017-01-31 ENCOUNTER — Ambulatory Visit: Payer: Self-pay | Admitting: Internal Medicine

## 2017-01-31 NOTE — Telephone Encounter (Signed)
    Chart reviewed as part of pre-operative protocol coverage. [facility]. D/w callback covering staff - pt has appointment TODAY with Dr. Johney FrameAllred at 3:30pm. We have requested he review to provide clearance at that time. We requested his covering staff fax to number below once clarified. Will remove this from pre-op pool.  Laurann Montanaayna N Dunn, PA-C  01/31/2017, 2:44 PM

## 2017-01-31 NOTE — Telephone Encounter (Signed)
[facility] hold Xarelto for 24 hours prior to upcoming EGD (date not set yet). Pt takes Xarelto for afib with CHADS2VASc score of 2 (HTN, CAD). Renal function is normal. Pt was admitted for Tikosyn load recently with DCCV on 12/14/16. Pt has completed 1 month of anticoagulation post-cardioversion. Ok to hold Xarelto for 24 hours prior to EGD as requested and resume after procedure.   Clearance form also requesting cardiac clearance - will route to pre op pool to address this.  Clearance will need to be faxed to [phone] once completed.

## 2017-02-05 ENCOUNTER — Encounter: Payer: Self-pay | Admitting: Internal Medicine

## 2017-02-09 NOTE — Telephone Encounter (Signed)
Patient was a no show for appointment.  [facility] as a [facility].

## 2017-03-09 ENCOUNTER — Other Ambulatory Visit: Payer: Self-pay | Admitting: Internal Medicine

## 2017-05-26 ENCOUNTER — Other Ambulatory Visit: Payer: Self-pay | Admitting: Cardiology

## 2017-10-15 ENCOUNTER — Telehealth: Payer: Self-pay | Admitting: Internal Medicine

## 2017-10-15 NOTE — Telephone Encounter (Signed)
appt made with afib clinic for 10/16/2017

## 2017-10-15 NOTE — Telephone Encounter (Signed)
New Message    STAT if HR is under 50 or over 120 (normal HR is 60-100 beats per minute)  1) What is your heart rate? 554am 111 , resting 57  10/14/17  64 resting  157 , 8/3 resting 59 128  8/2 resting 48 138 8/1 resting 55  High 146 7/31 resting 53 119   2) Do you have a log of your heart rate readings (document readings)? Yes see above   3) Do you have any other symptoms? Feels like there is a fish in his chest, about a month ago  He felt like he was having a heart attack  Stopped drinking a month and half ago.

## 2017-10-16 ENCOUNTER — Encounter (HOSPITAL_COMMUNITY): Payer: Self-pay | Admitting: Nurse Practitioner

## 2017-10-16 ENCOUNTER — Ambulatory Visit (HOSPITAL_COMMUNITY)
Admission: RE | Admit: 2017-10-16 | Discharge: 2017-10-16 | Disposition: A | Discharge status: Home / Self Care | Payer: 59 | Source: Ambulatory Visit | Attending: Nurse Practitioner | Admitting: Nurse Practitioner

## 2017-10-16 VITALS — BP 118/84 | HR 62 | Ht 75.0 in | Wt 366.0 lb

## 2017-10-16 DIAGNOSIS — I10 Essential (primary) hypertension: Secondary | ICD-10-CM | POA: Insufficient documentation

## 2017-10-16 DIAGNOSIS — Z6841 Body Mass Index (BMI) 40.0 and over, adult: Secondary | ICD-10-CM | POA: Insufficient documentation

## 2017-10-16 DIAGNOSIS — Z9119 Patient's noncompliance with other medical treatment and regimen: Secondary | ICD-10-CM | POA: Insufficient documentation

## 2017-10-16 DIAGNOSIS — Z87891 Personal history of nicotine dependence: Secondary | ICD-10-CM | POA: Diagnosis not present

## 2017-10-16 DIAGNOSIS — Z79899 Other long term (current) drug therapy: Secondary | ICD-10-CM | POA: Insufficient documentation

## 2017-10-16 DIAGNOSIS — I480 Paroxysmal atrial fibrillation: Secondary | ICD-10-CM | POA: Diagnosis not present

## 2017-10-16 DIAGNOSIS — F419 Anxiety disorder, unspecified: Secondary | ICD-10-CM | POA: Diagnosis not present

## 2017-10-16 DIAGNOSIS — G4733 Obstructive sleep apnea (adult) (pediatric): Secondary | ICD-10-CM | POA: Diagnosis not present

## 2017-10-16 DIAGNOSIS — Z7901 Long term (current) use of anticoagulants: Secondary | ICD-10-CM | POA: Diagnosis not present

## 2017-10-16 DIAGNOSIS — I481 Persistent atrial fibrillation: Secondary | ICD-10-CM | POA: Insufficient documentation

## 2017-10-16 DIAGNOSIS — R0789 Other chest pain: Secondary | ICD-10-CM | POA: Diagnosis not present

## 2017-10-16 LAB — CBC
HCT: 43 % (ref 39.0–52.0)
Hemoglobin: 14.6 g/dL (ref 13.0–17.0)
MCH: 30.2 pg (ref 26.0–34.0)
MCHC: 34 g/dL (ref 30.0–36.0)
MCV: 89 fL (ref 78.0–100.0)
PLATELETS: 236 10*3/uL (ref 150–400)
RBC: 4.83 MIL/uL (ref 4.22–5.81)
RDW: 13.3 % (ref 11.5–15.5)
WBC: 5.9 10*3/uL (ref 4.0–10.5)

## 2017-10-16 LAB — COMPREHENSIVE METABOLIC PANEL
ALBUMIN: 4.1 g/dL (ref 3.5–5.0)
ALT: 26 U/L (ref 0–44)
AST: 27 U/L (ref 15–41)
Alkaline Phosphatase: 47 U/L (ref 38–126)
Anion gap: 8 (ref 5–15)
BILIRUBIN TOTAL: 1.1 mg/dL (ref 0.3–1.2)
BUN: 8 mg/dL (ref 6–20)
CHLORIDE: 104 mmol/L (ref 98–111)
CO2: 26 mmol/L (ref 22–32)
Calcium: 9 mg/dL (ref 8.9–10.3)
Creatinine, Ser: 0.7 mg/dL (ref 0.61–1.24)
GFR calc Af Amer: >60 mL/min (ref >60)
GFR calc non Af Amer: >60 mL/min (ref >60)
GLUCOSE: 88 mg/dL (ref 70–99)
Potassium: 4.2 mmol/L (ref 3.5–5.1)
Sodium: 138 mmol/L (ref 135–145)
Total Protein: 6.4 g/dL — ABNORMAL LOW (ref 6.5–8.1)

## 2017-10-16 LAB — TSH: TSH: 2.123 u[IU]/mL (ref 0.350–4.500)

## 2017-10-16 LAB — MAGNESIUM: Magnesium: 2.2 mg/dL (ref 1.7–2.4)

## 2017-10-16 MED ORDER — METOPROLOL TARTRATE 50 MG PO TABS: 50.0000 mg | ORAL_TABLET | Freq: Two times a day (BID) | ORAL | 2 refills | Status: DC

## 2017-10-16 NOTE — Patient Instructions (Signed)
Decrease metoprolol to 50mg twice a day °
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
## 2018-01-01 ENCOUNTER — Other Ambulatory Visit (HOSPITAL_COMMUNITY): Payer: Self-pay | Admitting: *Deleted

## 2018-01-01 MED ORDER — DOFETILIDE 500 MCG PO CAPS: 500.0000 ug | ORAL_CAPSULE | Freq: Two times a day (BID) | ORAL | 1 refills | Status: DC

## 2018-01-17 ENCOUNTER — Ambulatory Visit (HOSPITAL_COMMUNITY): Payer: Self-pay | Admitting: Nurse Practitioner

## 2018-02-02 ENCOUNTER — Other Ambulatory Visit: Payer: Self-pay | Admitting: Gastroenterology

## 2018-02-11 ENCOUNTER — Other Ambulatory Visit: Payer: Self-pay | Admitting: Gastroenterology

## 2018-02-23 ENCOUNTER — Other Ambulatory Visit: Payer: Self-pay | Admitting: Cardiology

## 2018-02-25 NOTE — Telephone Encounter (Signed)
Pt is a 47 yr old male who saw AFib clinic on 10/16/17. Weight at that visit was 166 Kg. Last SCr on 10/16/17 was 0.70. CrCl is 300 mL/min. Will refill Xarelto 20mg  QD.

## 2018-02-25 NOTE — Telephone Encounter (Signed)
It appears his care was handed over to Dr. Johney FrameAllred.

## 2018-07-06 ENCOUNTER — Other Ambulatory Visit: Payer: Self-pay | Admitting: Internal Medicine

## 2018-07-09 ENCOUNTER — Other Ambulatory Visit: Payer: Self-pay

## 2018-07-09 ENCOUNTER — Ambulatory Visit (INDEPENDENT_AMBULATORY_CARE_PROVIDER_SITE_OTHER): Payer: 59 | Admitting: Cardiology

## 2018-07-09 DIAGNOSIS — K222 Esophageal obstruction: Secondary | ICD-10-CM | POA: Diagnosis not present

## 2018-07-09 DIAGNOSIS — I209 Angina pectoris, unspecified: Secondary | ICD-10-CM

## 2018-07-09 DIAGNOSIS — Z79899 Other long term (current) drug therapy: Secondary | ICD-10-CM

## 2018-07-09 DIAGNOSIS — Z7901 Long term (current) use of anticoagulants: Secondary | ICD-10-CM

## 2018-07-09 DIAGNOSIS — I119 Hypertensive heart disease without heart failure: Secondary | ICD-10-CM

## 2018-07-09 DIAGNOSIS — I480 Paroxysmal atrial fibrillation: Secondary | ICD-10-CM

## 2018-07-09 NOTE — Patient Instructions (Addendum)
Medication Instructions:  Your physician recommends that you continue on your current medications as directed. Please refer to the Current Medication list given to you today.   If you need a refill on your cardiac medications before your next appointment, please call your pharmacy.   Lab work: Your physician recommends that you return for lab work today: Troponin  If you have labs (blood work) drawn today and your tests are completely normal, you will receive your results only by: Marland Kitchen MyChart Message (if you have MyChart) OR . A paper copy in the mail If you have any lab test that is abnormal or we need to change your treatment, we will call you to review the results.  Testing/Procedures: None.   Follow-Up: At [facility], you and your health needs are our priority.  As part of our continuing mission to provide you with exceptional heart care, we have created designated Provider Care Teams.  These Care Teams include your primary Cardiologist (physician) and Advanced Practice Providers (APPs -  Physician Assistants and Nurse Practitioners) who all work together to provide you with the care you need, when you need it. You will need a follow up appointment in 1 weeks.  Please call our office 2 months in advance to schedule this appointment.  You may see No primary care provider on file. or another member of our [employer] Provider Team in [place]: Gypsy Balsam, MD . Belva Crome, MD  Any Other Special Instructions Will Be Listed Below (If Applicable).

## 2018-07-09 NOTE — Progress Notes (Signed)
Cardiology Office Note:    Date:  07/09/2018   ID:  [name], DOB 03-Sep-1970, MRN [id number]  PCP:  [name], PA-C  Cardiologist:  Gypsy Balsam, MD    Referring MD: [name], PA-C   No chief complaint on file. I have abnormal EKG  History of Present Illness:    Jesus Barnes is a 48 y.o. male complex past medical history which include paroxysmal atrial fibrillation.  He was taking Tikosyn with good success.  He also takes anticoagulation.  Last week and he was eating big hamburger and he felt like food got stuck in his esophagus.  Since that time he does have a chest pain on and off.  On top of that he got difficulty swallowing.  There is no pain while swallowing but he said many times he threw up when he is trying to eat.  He is able to eat liquid food but eating some solid to create a problem.  This is the situation he got previously he did have a esophageal dilatation done previously.  He actually drove today to urgent care because of this EKG was done EKG showed atrial fibrillation and he was sent to [place] for evaluation.  Few days ago he ran out of his Tikosyn.  Finally prescription has been called and is ready to be picked up but few days he was without Tikosyn.  Within few days he noticed also his heart flip flopping and he thinks he is in atrial fibrillation he came to [place] with EKG done by urgent care actually he does have atrial fibrillation with ventricle rate 114.  Nonspecific ST segment changes.  At the moment of my interview he was asymptomatic.  Past Medical History:  Diagnosis Date  . Anxiety 09/02/2014  . Burn   . Esophageal stricture 09/02/2014  . Essential hypertension 09/01/2014  . 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HCC)   . Hypertension   . Morbid obesity with body mass index (BMI) of 45.0 to 49.9 in adult [facility]) 04/05/2016  . Obstructive sleep apnea    awaiting treatment  . Persistent atrial fibrillation (HCC) 09/01/2014   Overview:  CHADS2=1    Past Surgical History:   Procedure Laterality Date  . APPENDECTOMY    . CARDIOVERSION    . CARDIOVERSION N/A 12/14/2016   Procedure: CARDIOVERSION;  Surgeon: Marinus Maw, MD;  Location: [facility] INVASIVE CV LAB;  Service: Cardiovascular;  Laterality: N/A;  . KNEE SURGERY    . SKIN GRAFT      Current Medications: No outpatient medications have been marked as taking for the 07/09/18 encounter (Office Visit) with Georgeanna Lea, MD.     Allergies:   Patient has no known allergies.   Social History   Socioeconomic History  . Marital status: Married    Spouse name: Not on file  . Number of children: Not on file  . Years of education: Not on file  . Highest education level: Not on file  Occupational History  . Not on file  Social Needs  . Financial resource strain: Not on file  . Food insecurity:    Worry: Not on file    Inability: Not on file  . Transportation needs:    Medical: Not on file    Non-medical: Not on file  Tobacco Use  . Smoking status: Former Smoker    Types: Cigars  . Smokeless tobacco: Never Used  . Tobacco comment: ocassional cigar  Substance and Sexual Activity  . Alcohol use: Yes  Comment: weekends  . Drug use: No  . Sexual activity: Not on file  Lifestyle  . Physical activity:    Days per week: Not on file    Minutes per session: Not on file  . Stress: Not on file  Relationships  . Social connections:    Talks on phone: Not on file    Gets together: Not on file    Attends religious service: Not on file    Active member of club or organization: Not on file    Attends meetings of clubs or organizations: Not on file    Relationship status: Not on file  Other Topics Concern  . 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 ROS:   Please see the history of present illness.    All 14 point review of  systems negative except as described per history of present illness  EKGs/Labs/Other Studies Reviewed:      Recent Labs: 10/16/2017: ALT 26; BUN 8; Creatinine, Ser 0.70; Hemoglobin 14.6; Magnesium 2.2; Platelets 236; Potassium 4.2; Sodium 138; TSH 2.123  Recent Lipid Panel No results found for: CHOL, TRIG, HDL, CHOLHDL, VLDL, LDLCALC, LDLDIRECT  Physical Exam:    VS:  There were no vitals taken for this visit.    Wt Readings from Last 3 Encounters:  10/16/17 (!) 366 lb (166 kg)  12/25/16 (!) 357 lb 12.8 oz (162.3 kg)  12/15/16 (!) 350 lb 3.2 oz (158.8 kg)     GEN:  Well nourished, well developed in no acute distress HEENT: Normal NECK: No JVD; No carotid bruits LYMPHATICS: No lymphadenopathy CARDIAC: RRR, no murmurs, no rubs, no gallops RESPIRATORY:  Clear to auscultation without rales, wheezing or rhonchi  ABDOMEN: Soft, non-tender, non-distended MUSCULOSKELETAL:  No edema; No deformity  SKIN: Warm and dry LOWER EXTREMITIES: no swelling NEUROLOGIC:  Alert and oriented x 3 PSYCHIATRIC:  Normal affect   ASSESSMENT:    1. Angina pectoris (HCC)   2. Paroxysmal atrial fibrillation (HCC)   3. Hypertensive heart disease without heart failure   4. Esophageal stricture   5. High risk medication use   6. Chronic anticoagulation    PLAN:    In order of problems listed above:  1. Atypical chest pain but I suspect this is more GI issue rather than heart.  This is the pain that he got previously.  Just to be sure I will ask him to have troponin I done if troponin is -10 in my opinion him to be evaluated by GI specialist.  I will send a request to Dr. Chales Abrahams to see him as quickly as possible. 2. 2.  Paroxysmal atrial fibrillation.  He will restart his Tikosyn as previously.  End of the week to see how he is doing and then he will have another despite this time virtual visit to check and he converted to sinus rhythm.  He knows exactly when he goes to atrial fibrillation so he will be  able to tell us if he converted.  We can always bring him back to the office repeat EKG. 3. 3.  History of esophageal stricture that required dilatation.  I am worried he required this procedure again we will schedule him to see Dr. Chales Abrahams. 4. Chronic anticoagulation will continue.   Medication Adjustments/Labs and Tests Ordered: Current medicines are reviewed at length with  the patient today.  Concerns regarding medicines are outlined above.  Orders Placed This Encounter  Procedures  . Troponin I   Medication changes: No orders of the defined types were placed in this encounter.   Signed, Georgeanna Leaobert J. Krasowski, MD, [facility] 07/09/2018 7:37 PM    [facility]

## 2018-07-10 ENCOUNTER — Telehealth: Payer: Self-pay | Admitting: *Deleted

## 2018-07-10 ENCOUNTER — Telehealth (INDEPENDENT_AMBULATORY_CARE_PROVIDER_SITE_OTHER): Payer: 59 | Admitting: Gastroenterology

## 2018-07-10 ENCOUNTER — Telehealth (HOSPITAL_COMMUNITY): Payer: Self-pay | Admitting: *Deleted

## 2018-07-10 ENCOUNTER — Telehealth: Payer: Self-pay

## 2018-07-10 ENCOUNTER — Encounter: Payer: Self-pay | Admitting: Gastroenterology

## 2018-07-10 DIAGNOSIS — I4891 Unspecified atrial fibrillation: Secondary | ICD-10-CM

## 2018-07-10 DIAGNOSIS — K219 Gastro-esophageal reflux disease without esophagitis: Secondary | ICD-10-CM | POA: Diagnosis not present

## 2018-07-10 DIAGNOSIS — R1310 Dysphagia, unspecified: Secondary | ICD-10-CM

## 2018-07-10 DIAGNOSIS — Z86010 Personal history of colonic polyps: Secondary | ICD-10-CM

## 2018-07-10 LAB — TROPONIN I: Troponin I: <0.01 ng/mL (ref 0.00–0.04)

## 2018-07-10 MED ORDER — PANTOPRAZOLE SODIUM 40 MG PO TBEC: 40.0000 mg | DELAYED_RELEASE_TABLET | Freq: Two times a day (BID) | ORAL | 3 refills | Status: DC

## 2018-07-10 NOTE — Telephone Encounter (Signed)
[facility] Pre-operative Risk Assessment     Request for surgical clearance:     Endoscopy Procedure  What type of surgery is being performed?     Colon/EGD  When is this surgery scheduled?     Not scheduled yet  What type of clearance is required ?   Pharmacy  Are there any medications that need to be held prior to surgery and how long? Xarelto  Practice name and name of physician performing surgery?      [facility]  What is your office phone and fax number?      Phone- [phone]  [phone]  Anesthesia type (None, local, MAC, general) ?       MAC

## 2018-07-10 NOTE — Telephone Encounter (Signed)
Patient called in stating he ran out of tikosyn last week due to delay  In prescription transmission and his pharmacy had it on back order until yesterday evening. He restarted tikosyn last PM. Pt endorses he is still in AF today this is complicated by the fact he needs to have his esophageus dilatated due to continued issues with food impaction. He is only able to take in liquids/shakes at this point. His GI doc will not perform procedure without afib controlled. Educated pt that he if he misses more than 2 doses of tikosyn in a row he should not restart without being hospitalized. PT to come in for EKG tomorrow morning to assess QT with restarting tikosyn.   Pt has not missed any doses of Xarelto - ER precautions reviewed with patient as well. Pt verbalized understanding.

## 2018-07-10 NOTE — Telephone Encounter (Signed)
Telephone call to patient. Left message of troponin I lab result was negative and to call if he needs more information.

## 2018-07-10 NOTE — Telephone Encounter (Signed)
Please comment on anticoagulation 

## 2018-07-10 NOTE — Patient Instructions (Signed)
You have been scheduled for an Upper GI Series and Small Bowel Follow Thru at [facility]. Your appointment is on 08/28/18 at 11am. Please arrive 15 minutes prior to your test for registration. Make certain not to have anything to eat or drink after midnight on the night before your test. If you need to reschedule, please contact radiology at [phone]. --------------------------------------------------------------------------------------------------------------- An upper GI series uses x rays to help diagnose problems of the upper GI tract, which includes the esophagus, stomach, and duodenum. The duodenum is the first part of the small intestine. An upper GI series is conducted by a radiology technologist or a radiologist-a doctor who specializes in x-ray imaging-at a hospital or outpatient center. While sitting or standing in front of an x-ray machine, the patient drinks barium liquid, which is often white and has a chalky consistency and taste. The barium liquid coats the lining of the upper GI tract and makes signs of disease show up more clearly on x rays. X-ray video, called fluoroscopy, is used to view the barium liquid moving through the esophagus, stomach, and duodenum. Additional x rays and fluoroscopy are performed while the patient lies on an x-ray table. To fully coat the upper GI tract with barium liquid, the technologist or radiologist may press on the abdomen or ask the patient to change position. Patients hold still in various positions, allowing the technologist or radiologist to take x rays of the upper GI tract at different angles. If a technologist conducts the upper GI series, a radiologist will later examine the images to look for problems.  This test typically takes about 1 hour to complete --------------------------------------------------------------------------------------------------------------------------------------------- The Small Bowel Follow Thru examination is used to  visualize the entire small bowel (intestines); specifically the connection between the small and large intestine. You will be positioned on a flat x-ray table and an image of your abdomen taken. Then the technologist will show the x-ray to the radiologist. The radiologist will instruct your technologist how much (1-2 cups) barium sulfate you will drink and when to begin taking the timed x-rays, usually 15-30 minutes after you begin drinking. Barium is a harmless substance that will highlight your small intestine by absorbing x-ray. The taste is chalky and it feels very heavy both in the cup and in your stomach.  After the first x-ray is taken and shown to the radiologist, he/she will determine when the next image is to be taken. This is repeated until the barium has reached the end of the small intestine and enters the beginning of the colon (cecum). At such time when the barium spills into the colon, you will be positioned on the x-ray table once again. The radiologist will use a fluoroscopic camera to take some detailed pictures of the connection between your small intestine and colon. The fluoroscope is an x-ray unit that works with a television/computer screen. The radiologist will apply pressure to your abdomen with his/her hand and a lead glove, a plastic paddle, or a paddle with an inflated rubber balloon on the end. This is to spread apart your loops of intestine so he/she can see all areas.   This test typically takes around 1 hour to complete.  Important  Drink plenty of water (8-10 cups/day) for a few days following the procedure to avoid constipation and blockage. The barium will make your stools white for a few days. --------------------------------------------------------------------------------------------------------------------------------------------  It has been recommended to you by your physician that you have a(n) Colonoscopy completed. We did not schedule the  procedure(s) today. We  will contact you when this is scheduled.   You will be contacted by our office prior to your procedure for directions on holding your Xarelto.  If you do not hear from our office 1 week prior to your scheduled procedure, please call [phone] to discuss.   We have sent the following medications to your pharmacy for you to pick up at your convenience: Protonix  Thank you,  Dr. Lynann Bolognaajesh Gupta

## 2018-07-10 NOTE — Telephone Encounter (Signed)
Yes, we needs to wait for a.fibr to convert. Will see him next week  Thanks

## 2018-07-10 NOTE — Telephone Encounter (Addendum)
   Primary Cardiologist: Gypsy Balsam, MD  Chart reviewed as part of pre-operative protocol coverage. Patient was contacted 07/10/2018 in reference to pre-operative risk assessment for pending surgery as outlined below.  Jesus Barnes was last seen on 07/09/18 by Dr. Bing Matter.    Per our pharmacy staff: Pt takes Xarelto for afib with CHADS2VASc score of 2 (HTN, CAD). Renal function is normal. Ok to hold Xarelto for 2 days prior to procedure.  Unfortunately, his Afib is currently not rate controlled. He is reinitiating tikosyn under close supervision. He is scheduled to see Dr. Bing Matter next week. He will guide timing of the EGD.  I will route this to the requesting party via Epic fax function. I will remove this request from the preop pool.   Dr. Bing Matter, please let the requesting office know when this patient is stable for procedure.   Roe Rutherford Duke, PA 07/10/2018, 2:10 PM

## 2018-07-10 NOTE — Telephone Encounter (Signed)
-----   Message from Georgeanna Lea, MD sent at 07/10/2018  9:47 AM EDT ----- Trop negative

## 2018-07-10 NOTE — Telephone Encounter (Signed)
Pt takes Xarelto for afib with CHADS2VASc score of 2 (HTN, CAD). Renal function is normal. Ok to hold Xarelto for 2 days prior to procedure.

## 2018-07-10 NOTE — Progress Notes (Signed)
Chief Complaint: Dysphagia  Referring Provider:  [facility]      ASSESSMENT AND PLAN;   #1. GERD with esophageal dysphagia, postprandial epigastric pain. #2. A. Fib with SOB. Restarted Tikosyn (ran out) #3. H/O tubulovillous adenoma (Dr Charm BargesButler 06/2001-patient was advised to get repeat colonoscopy in 2004)   Plan: -Increase protonix 40mg  po bid (#60).  If still with problems, add Pepcid 20 mg p.o. nightly. -Proceed with UGI series ( with Ba tab as well). -Patient has been seen by Dr. Kirtland BouchardK.  Has appointment with Dr Allred/Dr Dulce SellarMunley for A. Fib. (cardiology)  -Once cleared by cardiology/cardiology WU is complete, and once it is okay to take him off Xarelto, would like to proceed with EGD with eso dil and colonoscopy (miralax prep). I have discussed risks and benefits with the patient in detail.  I have also told him that his cardiology eval/treatment takes precedence over GI work-up.  He understands. -Re televisit in 2 weeks.   HPI:    Jesus Barnes is a 48 y.o. male  Epigastric pain with occ dyspragia with nausea "food getting stuck again" x 3-4 weeks.  This does "trigger A. Fib" Did very well after dilatation October 2016. No melena or hematochezia.  No weight loss. No odynophagia   Currnetly in A. Fib with some shortness of breath.  Also had chest discomfort.  Seen by Dr. Winn JockK-troponin was negative. Has ointment with Dr. Allred/Dr. Dulce SellarMunley for Monday.  Apparently the plan is to re-cardiovert if he continues to be in A. Fib.  No diarrhea, constipation, fevers chills.  No jaundice dark urine or pale stools.  Now willing to get colonoscopy performed as well.   Past GI procedures: -EGD 12/2014 mild gastritis, s/p esophageal dilatation 52Fr. Eso Bx- neg for EoE -Colonoscopy by Dr. Charm BargesButler 06/2001-rectal tubulovillous adenoma.  Advised to get it repeated in 1 year.  He did not.  Declined colonoscopy 2016. Past Medical History:  Diagnosis Date  . Anxiety 09/02/2014  . Burn   .  Dysphagia   . Esophageal stricture 09/02/2014  . Essential hypertension 09/01/2014  . GERD (gastroesophageal reflux disease)   . 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HCC)   . Hypertension   . Morbid obesity with body mass index (BMI) of 45.0 to 49.9 in adult [facility](HCC) 04/05/2016  . Obstructive sleep apnea    awaiting treatment  . Palpitation   . Persistent atrial fibrillation 09/01/2014   Overview:  CHADS2=1    Past Surgical History:  Procedure Laterality Date  . APPENDECTOMY    . CARDIOVERSION    . CARDIOVERSION N/A 12/14/2016   Procedure: CARDIOVERSION;  Surgeon: Marinus Mawaylor, Gregg W, MD;  [facility];  Service: Cardiovascular;  Laterality: N/A;  . COLONOSCOPY  06/18/2001  . ESOPHAGOGASTRODUODENOSCOPY  12/17/2014   mild gastritis. Small antral polyps (likely inflammatory). Status post empiric esophageal dilatation  . HERNIA REPAIR     umblical  . KNEE SURGERY    . SKIN GRAFT      Family History  Problem Relation Age of Onset  . Diabetes Paternal Grandfather   . Heart disease Paternal Grandfather   . Colon cancer Neg Hx   . Esophageal cancer Neg Hx     Social History   Tobacco Use  . Smoking status: Former Smoker    Types: Cigars  . Smokeless tobacco: Never Used  . Tobacco comment: quit 7 years ago  Substance Use Topics  . Alcohol use: Not Currently    Comment: quti 2 weeks ago  .  Drug use: No    Current Outpatient Medications  Medication Sig Dispense Refill  . buPROPion (WELLBUTRIN XL) 300 MG 24 hr tablet Take 1 tablet by mouth daily.    . clonazePAM (KLONOPIN) 0.5 MG tablet 1 tablet daily.    Marland Kitchen dofetilide (TIKOSYN) 500 MCG capsule Take 1 capsule (500 mcg total) by mouth 2 (two) times daily. Please Schedule Appoinment 180 capsule 0  . lisinopril (ZESTRIL) 10 MG tablet 1 tablet daily.    . metoprolol tartrate (LOPRESSOR) 50 MG tablet Take 1 tablet (50 mg total) by mouth 2 (two) times daily. 270 tablet 2  . pantoprazole (PROTONIX) 40 MG tablet Take 40 mg by mouth daily.    Marland Kitchen  testosterone cypionate (DEPOTESTOTERONE CYPIONATE) 100 MG/ML injection Inject 100 mg into the muscle every 14 (fourteen) days. For IM use only    . VITAMIN D, CHOLECALCIFEROL, PO Take 5,000 Units by mouth daily.    Carlena Hurl 20 MG TABS tablet TAKE ONE TABLET BY MOUTH EVERY EVENING WITH FOOD 90 tablet 3   No current facility-administered medications for this visit.     No Known Allergies  Review of Systems:  Constitutional: Denies fever, chills, diaphoresis, appetite change and fatigue.  HEENT: Denies photophobia, eye pain, redness, hearing loss, ear pain, congestion, sore throat, rhinorrhea, sneezing, mouth sores, neck pain, neck stiffness and tinnitus.   Respiratory: Had SOB, DOE, No cough, chest tightness,  and wheezing.   Cardiovascular: Had chest pain, palpitations, no leg swelling.  Genitourinary: Denies dysuria, urgency, frequency, hematuria, flank pain and difficulty urinating.  Musculoskeletal: Denies myalgias, back pain, joint swelling, arthralgias and gait problem.  Skin: No rash.  Neurological: Denies dizziness, seizures, syncope, weakness, light-headedness, numbness and headaches.  Hematological: Denies adenopathy. Easy bruising, personal or family bleeding history  Psychiatric/Behavioral: has anxiety, no depression     Physical Exam:    There were no vitals taken for this visit. There were no vitals filed for this visit. Tele-visit  Data Reviewed: I have personally reviewed following labs and imaging studies  CBC: CBC Latest Ref Rng & Units 10/16/2017 12/25/2016 06/05/2016  WBC 4.0 - 10.5 K/uL 5.9 6.8 -  Hemoglobin 13.0 - 17.0 g/dL 45.4 09.8 11.9  Hematocrit 39.0 - 52.0 % 43.0 45.8 44.0  Platelets 150 - 400 K/uL 236 242 -    CMP: CMP Latest Ref Rng & Units 10/16/2017 12/25/2016 12/15/2016  Glucose 70 - 99 mg/dL 88 94 90  BUN 6 - 20 mg/dL Creatinine 0.61 - 1.24 mg/dL 1.47 8.29 5.62  Sodium 135 - 145 mmol/L 138 138 134(L)  Potassium 3.5 - 5.1 mmol/L 4.2 4.0  4.1  Chloride 98 - 111 mmol/L 104 106 101  CO2 22 - 32 mmol/L Calcium 8.9 - 10.3 mg/dL 9.0 8.9 1.3(Y)  Total Protein 6.5 - 8.1 g/dL 6.4(L) - -  Total Bilirubin 0.3 - 1.2 mg/dL 1.1 - -  Alkaline Phos 38 - 126 U/L 47 - -  AST 15 - 41 U/L 27 - -  ALT 0 - 44 U/L 26 - -   This service was provided via telemedicine.  The patient was located at home.  The provider was located in office.  The patient did consent to this telephone visit and is aware of possible charges through their insurance for this visit.  The patient was referred by Sue Lush.   Time spent on call/coordination of care: 45 min    Edman Circle, MD 07/10/2018, 9:33 AM  Cc: [name], PA-C

## 2018-07-11 ENCOUNTER — Ambulatory Visit (HOSPITAL_COMMUNITY)
Admission: RE | Admit: 2018-07-11 | Discharge: 2018-07-11 | Disposition: A | Discharge status: Home / Self Care | Payer: 59 | Source: Ambulatory Visit | Attending: Nurse Practitioner | Admitting: Nurse Practitioner

## 2018-07-11 ENCOUNTER — Other Ambulatory Visit: Payer: Self-pay

## 2018-07-11 VITALS — BP 110/78 | HR 96 | Ht 75.0 in | Wt 369.0 lb

## 2018-07-11 DIAGNOSIS — Z87891 Personal history of nicotine dependence: Secondary | ICD-10-CM | POA: Diagnosis not present

## 2018-07-11 DIAGNOSIS — R9431 Abnormal electrocardiogram [ECG] [EKG]: Secondary | ICD-10-CM | POA: Insufficient documentation

## 2018-07-11 DIAGNOSIS — F419 Anxiety disorder, unspecified: Secondary | ICD-10-CM | POA: Diagnosis not present

## 2018-07-11 DIAGNOSIS — Z833 Family history of diabetes mellitus: Secondary | ICD-10-CM | POA: Diagnosis not present

## 2018-07-11 DIAGNOSIS — Z6841 Body Mass Index (BMI) 40.0 and over, adult: Secondary | ICD-10-CM | POA: Diagnosis not present

## 2018-07-11 DIAGNOSIS — K219 Gastro-esophageal reflux disease without esophagitis: Secondary | ICD-10-CM | POA: Insufficient documentation

## 2018-07-11 DIAGNOSIS — I4819 Other persistent atrial fibrillation: Secondary | ICD-10-CM | POA: Diagnosis not present

## 2018-07-11 DIAGNOSIS — Z7901 Long term (current) use of anticoagulants: Secondary | ICD-10-CM | POA: Diagnosis not present

## 2018-07-11 DIAGNOSIS — Z79899 Other long term (current) drug therapy: Secondary | ICD-10-CM | POA: Diagnosis not present

## 2018-07-11 DIAGNOSIS — Z8249 Family history of ischemic heart disease and other diseases of the circulatory system: Secondary | ICD-10-CM | POA: Insufficient documentation

## 2018-07-11 DIAGNOSIS — G4733 Obstructive sleep apnea (adult) (pediatric): Secondary | ICD-10-CM | POA: Insufficient documentation

## 2018-07-11 DIAGNOSIS — I10 Essential (primary) hypertension: Secondary | ICD-10-CM | POA: Diagnosis not present

## 2018-07-11 LAB — CBC
HCT: 45 % (ref 39.0–52.0)
Hemoglobin: 15.4 g/dL (ref 13.0–17.0)
MCH: 30.1 pg (ref 26.0–34.0)
MCHC: 34.2 g/dL (ref 30.0–36.0)
MCV: 87.9 fL (ref 80.0–100.0)
Platelets: 264 10*3/uL (ref 150–400)
RBC: 5.12 MIL/uL (ref 4.22–5.81)
RDW: 12.8 % (ref 11.5–15.5)
WBC: 6.2 10*3/uL (ref 4.0–10.5)
nRBC: 0 % (ref 0.0–0.2)

## 2018-07-11 LAB — BASIC METABOLIC PANEL
Anion gap: 11 (ref 5–15)
BUN: 12 mg/dL (ref 6–20)
CO2: 22 mmol/L (ref 22–32)
Calcium: 8.8 mg/dL — ABNORMAL LOW (ref 8.9–10.3)
Chloride: 106 mmol/L (ref 98–111)
Creatinine, Ser: 0.96 mg/dL (ref 0.61–1.24)
GFR calc Af Amer: >60 mL/min (ref >60)
GFR calc non Af Amer: >60 mL/min (ref >60)
Glucose, Bld: 103 mg/dL — ABNORMAL HIGH (ref 70–99)
Potassium: 4.2 mmol/L (ref 3.5–5.1)
Sodium: 139 mmol/L (ref 135–145)

## 2018-07-11 LAB — MAGNESIUM: Magnesium: 1.9 mg/dL (ref 1.7–2.4)
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
## 2018-07-16 ENCOUNTER — Other Ambulatory Visit: Payer: Self-pay

## 2018-07-16 ENCOUNTER — Ambulatory Visit (INDEPENDENT_AMBULATORY_CARE_PROVIDER_SITE_OTHER): Payer: 59 | Admitting: Cardiology

## 2018-07-16 ENCOUNTER — Telehealth: Payer: 59 | Admitting: Cardiology

## 2018-07-16 ENCOUNTER — Encounter: Payer: Self-pay | Admitting: Cardiology

## 2018-07-16 VITALS — BP 122/84 | HR 118 | Ht 75.5 in | Wt 372.6 lb

## 2018-07-16 DIAGNOSIS — Z79899 Other long term (current) drug therapy: Secondary | ICD-10-CM

## 2018-07-16 DIAGNOSIS — I209 Angina pectoris, unspecified: Secondary | ICD-10-CM | POA: Diagnosis not present

## 2018-07-16 DIAGNOSIS — I4819 Other persistent atrial fibrillation: Secondary | ICD-10-CM | POA: Diagnosis not present

## 2018-07-16 DIAGNOSIS — I119 Hypertensive heart disease without heart failure: Secondary | ICD-10-CM

## 2018-07-16 MED ORDER — METOPROLOL TARTRATE 50 MG PO TABS: 50.0000 mg | ORAL_TABLET | Freq: Three times a day (TID) | ORAL | 2 refills | Status: DC

## 2018-07-16 MED ORDER — DOFETILIDE 250 MCG PO CAPS: 250.0000 ug | ORAL_CAPSULE | Freq: Two times a day (BID) | ORAL | 2 refills | Status: DC

## 2018-07-16 MED ORDER — DOFETILIDE 250 MCG PO CAPS: 250.0000 ug | ORAL_CAPSULE | Freq: Two times a day (BID) | ORAL | 0 refills | Status: DC

## 2018-07-16 NOTE — Patient Instructions (Signed)
Medication Instructions:  Your physician has recommended you make the following change in your medication: INCREASE: Metoprolol 50mg  (1 Tab) three times daily TAKE: Tikosyn 250 mg (1 cap) twice daily  If you need a refill on your cardiac medications before your next appointment, please call your pharmacy.   Lab work: Your physician recommends that you return for lab work in: Today Pro BNP TSH,T3.T4,Troponin   If you have labs (blood work) drawn today and your tests are completely normal, you will receive your results only by: Marland Kitchen. MyChart Message (if you have MyChart) OR . A paper copy in the mail If you have any lab test that is abnormal or we need to change your treatment, we will call you to review the results.  Testing/Procedures:Your physician has requested that you have an echocardiogram. Echocardiography is a painless test that uses sound waves to create images of your heart. It provides your doctor with information about the size and shape of your heart and how well your heart's chambers and valves are working. This procedure takes approximately one hour. There are no restrictions for this procedure.    Follow-Up: At [facility], you and your health needs are our priority.  As part of our continuing mission to provide you with exceptional heart care, we have created designated Provider Care Teams.  These Care Teams include your primary Cardiologist (physician) and Advanced Practice Providers (APPs -  Physician Assistants and Nurse Practitioners) who all work together to provide you with the care you need, when you need it. Any Other Special Instructions Will Be Listed Below (If Applicable).   Echocardiogram An echocardiogram is a procedure that uses painless sound waves (ultrasound) to produce an image of the heart. Images from an echocardiogram can provide important information about:  Signs of coronary artery disease (CAD).  Aneurysm detection. An aneurysm is a weak or  damaged part of an artery wall that bulges out from the normal force of blood pumping through the body.  Heart size and shape. Changes in the size or shape of the heart can be associated with certain conditions, including heart failure, aneurysm, and CAD.  Heart muscle function.  Heart valve function.  Signs of a past heart attack.  Fluid buildup around the heart.  Thickening of the heart muscle.  A tumor or infectious growth around the heart valves. Tell a health care provider about:  Any allergies you have.  All medicines you are taking, including vitamins, herbs, eye drops, creams, and over-the-counter medicines.  Any blood disorders you have.  Any surgeries you have had.  Any medical conditions you have.  Whether you are pregnant or may be pregnant. What are the risks? Generally, this is a safe procedure. However, problems may occur, including:  Allergic reaction to dye (contrast) that may be used during the procedure. What happens before the procedure? No specific preparation is needed. You may eat and drink normally. What happens during the procedure?   An IV tube may be inserted into one of your veins.  You may receive contrast through this tube. A contrast is an injection that improves the quality of the pictures from your heart.  A gel will be applied to your chest.  A wand-like tool (transducer) will be moved over your chest. The gel will help to transmit the sound waves from the transducer.  The sound waves will harmlessly bounce off of your heart to allow the heart images to be captured in real-time motion. The images will be recorded  on a computer. The procedure may vary among health care providers and hospitals. What happens after the procedure?  You may return to your normal, everyday life, including diet, activities, and medicines, unless your health care provider tells you not to do that. Summary  An echocardiogram is a procedure that uses painless  sound waves (ultrasound) to produce an image of the heart.  Images from an echocardiogram can provide important information about the size and shape of your heart, heart muscle function, heart valve function, and fluid buildup around your heart.  You do not need to do anything to prepare before this procedure. You may eat and drink normally.  After the echocardiogram is completed, you may return to your normal, everyday life, unless your health care provider tells you not to do that. This information is not intended to replace advice given to you by your health care provider. Make sure you discuss any questions you have with your health care provider. Document Released: 02/25/2000 Document Revised: 04/01/2016 Document Reviewed: 04/01/2016 Elsevier Interactive Patient Education  2019 [employer].

## 2018-07-17 ENCOUNTER — Ambulatory Visit (INDEPENDENT_AMBULATORY_CARE_PROVIDER_SITE_OTHER): Payer: 59

## 2018-07-17 DIAGNOSIS — I209 Angina pectoris, unspecified: Secondary | ICD-10-CM | POA: Diagnosis not present

## 2018-07-17 DIAGNOSIS — I119 Hypertensive heart disease without heart failure: Secondary | ICD-10-CM | POA: Diagnosis not present

## 2018-07-17 LAB — PRO B NATRIURETIC PEPTIDE: NT-Pro BNP: 320 pg/mL — ABNORMAL HIGH (ref 0–121)

## 2018-07-17 LAB — T4: T4, Total: 6 ug/dL (ref 4.5–12.0)

## 2018-07-17 LAB — T3: T3, Total: 128 ng/dL (ref 71–180)

## 2018-07-17 LAB — TSH: TSH: 2.05 u[IU]/mL (ref 0.450–4.500)

## 2018-07-17 LAB — TROPONIN I: Troponin I: <0.01 ng/mL (ref 0.00–0.04)

## 2018-07-17 NOTE — Progress Notes (Signed)
Complete echocardiogram has been performed.  Jimmy Tashica Provencio, RDCS, RVCT

## 2018-07-18 ENCOUNTER — Telehealth: Payer: Self-pay

## 2018-07-18 DIAGNOSIS — R609 Edema, unspecified: Secondary | ICD-10-CM

## 2018-07-18 DIAGNOSIS — I420 Dilated cardiomyopathy: Secondary | ICD-10-CM

## 2018-07-18 DIAGNOSIS — I509 Heart failure, unspecified: Secondary | ICD-10-CM

## 2018-07-18 DIAGNOSIS — I119 Hypertensive heart disease without heart failure: Secondary | ICD-10-CM

## 2018-07-18 DIAGNOSIS — I4819 Other persistent atrial fibrillation: Secondary | ICD-10-CM

## 2018-07-18 MED ORDER — FUROSEMIDE 40 MG PO TABS: 40.0000 mg | ORAL_TABLET | Freq: Every day | ORAL | 3 refills | Status: DC

## 2018-07-18 NOTE — Telephone Encounter (Signed)
-----   Message from Baldo Daub, MD sent at 07/18/2018 11:59 AM EDT ----- Normal or stable result  His echocardiogram shows normal left ventricular ejection fraction, however it implies that he is retaining fluid or heart failure and I would like to start him on furosemide 40 mg daily.  In 1 week return to the office for BMP proBNP level.

## 2018-07-18 NOTE — Telephone Encounter (Signed)
Patient informed of echocardiogram and lab results and advised that Dr. Dulce Sellar recommends patient start taking furosemide 40 mg daily. Patient verbalized understanding and is agreeable. Prescription has been sent to [facility] in [place] as requested.   Patient will return to our [place] office one week after starting furosemide for repeat lab work, no appointment needed. No need to fast.   Patient verbalized understanding. No further questions.

## 2018-07-22 ENCOUNTER — Telehealth: Payer: Self-pay

## 2018-07-22 NOTE — Telephone Encounter (Signed)
Called patient in regards to cardiac clearance and we was told he can hold his Xarelto 2 days prior to procedure when it is scehduled since we do not have it scheduled yet. Patient verbalized understanding

## 2018-07-30 ENCOUNTER — Telehealth: Payer: Self-pay

## 2018-07-30 ENCOUNTER — Telehealth (INDEPENDENT_AMBULATORY_CARE_PROVIDER_SITE_OTHER): Payer: 59 | Admitting: Gastroenterology

## 2018-07-30 ENCOUNTER — Encounter: Payer: Self-pay | Admitting: Gastroenterology

## 2018-07-30 ENCOUNTER — Other Ambulatory Visit: Payer: Self-pay

## 2018-07-30 VITALS — Ht 75.0 in | Wt 362.0 lb

## 2018-07-30 DIAGNOSIS — K219 Gastro-esophageal reflux disease without esophagitis: Secondary | ICD-10-CM

## 2018-07-30 DIAGNOSIS — R1310 Dysphagia, unspecified: Secondary | ICD-10-CM

## 2018-07-30 DIAGNOSIS — I4891 Unspecified atrial fibrillation: Secondary | ICD-10-CM

## 2018-07-30 DIAGNOSIS — Z86010 Personal history of colonic polyps: Secondary | ICD-10-CM

## 2018-07-30 MED ORDER — FAMOTIDINE 20 MG PO TABS: 20.0000 mg | ORAL_TABLET | Freq: Every day | ORAL | 3 refills | Status: DC

## 2018-07-30 NOTE — Patient Instructions (Addendum)
To help prevent the possible spread of infection to our patients, communities, and staff; we will be implementing the following measures:  As of now we are not allowing any visitors/family members to accompany you to any upcoming appointments with [facility]. If you have any concerns about this please contact our office to discuss prior to the appointment.   You have been scheduled for an Upper GI Series at [facility]. Your appointment is on 08/06/2018 at 11:00am. Please arrive 15 minutes prior to your test for registration. Make sure not to eat or drink anything after midnight on the night before your test. If you need to reschedule, please call radiology at [phone]. ________________________________________________________________ An upper GI series uses x rays to help diagnose problems of the upper GI tract, which includes the esophagus, stomach, and duodenum. The duodenum is the first part of the small intestine. An upper GI series is conducted by a radiology technologist or a radiologist-a doctor who specializes in x-ray imaging-at a hospital or outpatient center. While sitting or standing in front of an x-ray machine, the patient drinks barium liquid, which is often white and has a chalky consistency and taste. The barium liquid coats the lining of the upper GI tract and makes signs of disease show up more clearly on x rays. X-ray video, called fluoroscopy, is used to view the barium liquid moving through the esophagus, stomach, and duodenum. Additional x rays and fluoroscopy are performed while the patient lies on an x-ray table. To fully coat the upper GI tract with barium liquid, the technologist or radiologist may press on the abdomen or ask the patient to change position. Patients hold still in various positions, allowing the technologist or radiologist to take x rays of the upper GI tract at different angles. If a technologist conducts the upper GI series, a radiologist will  later examine the images to look for problems.  This test typically takes about 1 hour to complete. __________________________________________________________________  Bonita Quin will be contacted by our office prior to your procedure for directions on holding your Xarelto.  If you do not hear from our office 1 week prior to your scheduled procedure, please call [phone] to discuss.   Please purchase the following medications over the counter and take as directed: Miralax Ducolax  We have sent the following medications to your pharmacy for you to pick up at your convenience: Pepcid 20mg  at bedtime  You have been scheduled for a colonoscopy. Please follow written instructions given to you at your visit today.  Please pick up your prep supplies at the pharmacy within the next 1-3 days. If you use inhalers (even only as needed), please bring them with you on the day of your procedure. Your physician has requested that you go to www.startemmi.com and enter the access code given to you at your visit today. This web site gives a general overview about your procedure. However, you should still follow specific instructions given to you by our office regarding your preparation for the procedure.  Thank you,  Dr. Lynann Bologna

## 2018-07-30 NOTE — Telephone Encounter (Signed)
   Primary Cardiologist: Norman Herrlich, MD  Chart reviewed as part of pre-operative protocol coverage. Patient was contacted 07/30/2018 in reference to pre-operative risk assessment for pending surgery as outlined below.  Jesus Barnes was last seen on 07/16/18 by Dr. Dulce Sellar.  He has been asymptomatic with bouts of Afib and denies any new or worsening symptoms. However, he needs repeat lab work given his recent medication changes.  I reached out to Dr. Dulce Sellar due to mention of upcoming DCCV for his Afib in his last office note.   Per Dr. Dulce Sellar: "I would not stop his anticoagulation at this time."  I will route this recommendation to the requesting party via Epic fax function and remove from pre-op pool.  Please call with questions.  Roe Rutherford Esty Ahuja, PA 07/30/2018, 3:37 PM

## 2018-07-30 NOTE — Progress Notes (Signed)
Chief Complaint: Dysphagia  Referring Provider:  [facility]      ASSESSMENT AND PLAN;   #1. GERD with esophageal dysphagia, postprandial epigastric pain. #2. A. Fib with SOB on Xeralto. Reduced Tikosyn by Dr Dulce Sellar d/t slightly prolonged QT interval. Nl EF on 2DE 07/2018. #3. H/O tubulovillous adenoma (Dr Charm Barges 06/2001-patient was advised to get repeat colonoscopy in 2004)   Plan: -Continue protonix  po bid (#60) and Pepcid 20 mg p.o. nightly. -Proceed with UGI series ASAP ( with Ba tab as well). Move the appt next week. -Cardiology clearence from Dr. Dulce Sellar. Pt has been told will believe that he is cleared. -Once cleared by cardiology, once it is okay to take him off Xarelto x 24 hrs, would like to proceed with EGD with eso dil and colonoscopy (miralax prep) at [facility]. I have discussed risks and benefits with the patient in detail. Please have Cathlyn Parsons review the chart to make sure it is OK for [facility].   HPI:    Jesus Barnes is a 48 y.o. male  Seen by cardiology several times, most recently by Dr. Dulce Sellar. Per patient he has been cleared to get endoscopic procedures. Has been doing much better from cardiac standpoint.  Still would have some problems with "food getting stuck" which triggers palpitations and A. Fib.  No further heartburn ever since he has been taking Protonix twice a day and Pepcid at bedtime.  He did very well with dilation in October 2016.  Denies having any melena or hematochezia.  No weight loss. No odynophagia  I have reviewed recent note from Dr. Dulce Sellar.  He has persistent A. fib.  The dose of beta-blocker was increased.  Dose of dofetilide was reduced due to prolonged QT interval.  He has been started on loop diuretic.  Echo was ordered.  Echo performed on 07/17/2018 showed right atrium is moderately dilated with Nl LV EF.  No diarrhea, constipation, fevers chills.  No jaundice dark urine or pale stools.  Now willing to get colonoscopy  performed as well.   Past GI procedures: -EGD 12/2014 mild gastritis, s/p esophageal dilatation 52Fr. Eso Bx- neg for EoE -Colonoscopy by Dr. Charm Barges 06/2001-rectal tubulovillous adenoma.  Advised to get it repeated in 1 year.  He did not.  Declined colonoscopy 2016. Past Medical History:  Diagnosis Date  . Anxiety 09/02/2014  . Burn   . Dysphagia   . Esophageal stricture 09/02/2014  . Essential hypertension 09/01/2014  . GERD (gastroesophageal reflux disease)   . 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HCC)   . Hypertension   . Morbid obesity with body mass index (BMI) of 45.0 to 49.9 in adult [facility]) 04/05/2016  . Obstructive sleep apnea    awaiting treatment  . On amiodarone therapy 10/16/2016  . Palpitation   . Persistent atrial fibrillation 09/01/2014   Overview:  CHADS2=1    Past Surgical History:  Procedure Laterality Date  . APPENDECTOMY    . CARDIOVERSION    . CARDIOVERSION N/A 12/14/2016   Procedure: CARDIOVERSION;  Surgeon: Marinus Maw, MD;  Location: [facility] INVASIVE CV LAB;  Service: Cardiovascular;  Laterality: N/A;  . COLONOSCOPY  06/18/2001    Bx: Tubulovillous adenoma. Done by Dr. Charm Barges. He was told to come back 64yr later, but pt didnot.   . ESOPHAGOGASTRODUODENOSCOPY  12/17/2014   mild gastritis. Small antral polyps (likely inflammatory). Status post empiric esophageal dilatation  . HERNIA REPAIR     umblical  . KNEE SURGERY    . SKIN GRAFT  Family History  Problem Relation Age of Onset  . Diabetes Paternal Grandfather   . Heart disease Paternal Grandfather   . Colon cancer Neg Hx   . Esophageal cancer Neg Hx     Social History   Tobacco Use  . Smoking status: Former Smoker    Types: Cigars  . Smokeless tobacco: Never Used  . Tobacco comment: quit 7 years ago  Substance Use Topics  . Alcohol use: Yes    Comment: ocassionally  . Drug use: No    Current Outpatient Medications  Medication Sig Dispense Refill  . acetaminophen (TYLENOL 8 HOUR ARTHRITIS PAIN) 650 MG CR  tablet Take 650 mg by mouth every 8 (eight) hours as needed for pain.    Marland Kitchen atorvastatin (LIPITOR) 10 MG tablet Take 10 mg by mouth daily.    Marland Kitchen buPROPion (WELLBUTRIN XL) 300 MG 24 hr tablet Take 1 tablet by mouth daily.    . clonazePAM (KLONOPIN) 0.5 MG tablet 1 tablet daily.    Marland Kitchen dofetilide (TIKOSYN) 250 MCG capsule Take 1 capsule (250 mcg total) by mouth 2 (two) times daily. 60 capsule 2  . furosemide (LASIX) 40 MG tablet Take 1 tablet (40 mg total) by mouth daily. 30 tablet 3  . lisinopril (ZESTRIL) 10 MG tablet 1 tablet daily.    . metoprolol tartrate (LOPRESSOR) 50 MG tablet Take 1 tablet (50 mg total) by mouth 3 (three) times daily. (Patient taking differently: Take 50 mg by mouth. Takes 1.5 tablets 2 times daily) 180 tablet 2  . pantoprazole (PROTONIX) 40 MG tablet Take 1 tablet (40 mg total) by mouth 2 (two) times daily. 60 tablet 3  . testosterone cypionate (DEPOTESTOTERONE CYPIONATE) 100 MG/ML injection Inject 100 mg into the muscle every 14 (fourteen) days. For IM use only    . XARELTO 20 MG TABS tablet TAKE ONE TABLET BY MOUTH EVERY EVENING WITH FOOD 90 tablet 3  . VITAMIN D, CHOLECALCIFEROL, PO Take 5,000 Units by mouth daily.     No current facility-administered medications for this visit.     No Known Allergies  Review of Systems:  Constitutional: Denies fever, chills, diaphoresis, appetite change and fatigue.  HEENT: Denies photophobia, eye pain, redness, hearing loss, ear pain, congestion, sore throat, rhinorrhea, sneezing, mouth sores, neck pain, neck stiffness and tinnitus.   Respiratory: Had SOB, DOE, No cough, chest tightness,  and wheezing.   Cardiovascular: Had chest pain, palpitations, no leg swelling.  Genitourinary: Denies dysuria, urgency, frequency, hematuria, flank pain and difficulty urinating.  Musculoskeletal: Denies myalgias, back pain, joint swelling, arthralgias and gait problem.  Skin: No rash.  Neurological: Denies dizziness, seizures, syncope, weakness,  light-headedness, numbness and headaches.  Hematological: Denies adenopathy. Easy bruising, personal or family bleeding history  Psychiatric/Behavioral: has anxiety, no depression     Physical Exam:    Ht 6\' 3"  (1.905 m)   Wt (!) 362 lb (164.2 kg)   BMI 45.25 kg/m  Filed Weights   07/30/18 0814  Weight: (!) 362 lb (164.2 kg)   Tele-visit  Data Reviewed: I have personally reviewed following labs and imaging studies  CBC: CBC Latest Ref Rng & Units 07/11/2018 10/16/2017 12/25/2016  WBC 4.0 - 10.5 K/uL 6.2 5.9 6.8  Hemoglobin 13.0 - 17.0 g/dL 14.4 81.8 56.3  Hematocrit 39.0 - 52.0 % 45.0 43.0 45.8  Platelets 150 - 400 K/uL 264 236 242    CMP: CMP Latest Ref Rng & Units 07/11/2018 10/16/2017 12/25/2016  Glucose 70 - 99 mg/dL 149(F) 88  94  BUN 6 - 20 mg/dL 12 8 11   Creatinine 0.61 - 1.24 mg/dL 1.610.96 0.960.70 0.450.87  Sodium 135 - 145 mmol/L 139 138 138  Potassium 3.5 - 5.1 mmol/L 4.2 4.2 4.0  Chloride 98 - 111 mmol/L 106 104 106  CO2 22 - 32 mmol/L 22 26 25   Calcium 8.9 - 10.3 mg/dL 4.0(J8.8(L) 9.0 8.9  Total Protein 6.5 - 8.1 g/dL - 6.4(L) -  Total Bilirubin 0.3 - 1.2 mg/dL - 1.1 -  Alkaline Phos 38 - 126 U/L - 47 -  AST 15 - 41 U/L - 27 -  ALT 0 - 44 U/L - 26 -   This service was provided via telemedicine.  The patient was located at home.  The provider was located in office.  The patient did consent to this telephone visit and is aware of possible charges through their insurance for this visit.  The patient was referred by Sue LushAndrea.   Time spent on call/coordination of care: 15 min    Edman Circleaj Shakiyah Cirilo, MD 07/30/2018, 8:59 AM  Cc: Gus HeightJohnson, Andrea, PA-C

## 2018-07-30 NOTE — Telephone Encounter (Signed)
Dr. Dulce Sellar - you have recently seen this patient and was considering DCCV. We are being asked to hold his xarelto for GI. Can you comment on this? OK to proceed with holding [facility] for EGD with dilation and colonoscopy? He has generally been asymptomatic from an Afib standpoint, but is having many issues that seem to be worsening surrounding his esophagus and eating.  Question if the EGD and dilation should take precedence over the DCCV.   Of note: I called and spoke with the patient about the clearance. He forgot to get labs yesterday and is now out of town until Friday night. I strongly encouraged him to get labs drawn as soon as he could. He also described urinary problems that seem to be worsening. I will call his urologist to get him an appt next week. I told him I am concerned about his renal function on the lasix and that I would send you a message.  Thanks Angie

## 2018-07-30 NOTE — Telephone Encounter (Signed)
Patient with diagnosis of afib on Xarelto for anticoagulation.    Procedure: EGD w/ Dil and Colonoscopy  Date of procedure: 08/16/2018  CHADS2-VASc score of  3 (CHF, HTN, AGE, DM2, stroke/tia x 2, CAD, AGE, male)  CrCl 135 ml/min  Per office protocol, patient can hold Xarelto for 1 day prior to procedure.

## 2018-07-30 NOTE — Telephone Encounter (Signed)
[facility] Pre-operative Risk Assessment     Request for surgical clearance:     Endoscopy Procedure  What type of surgery is being performed?     EGD w/ Dil and Colonoscopy   When is this surgery scheduled?     08/16/2018  What type of clearance is required ?   Pharmacy  Are there any medications that need to be held prior to surgery and how long? Xarelto 24 hours  Practice name and name of physician performing surgery?      [facility] [place]          Dr. Lyndel Safe  What is your office phone and fax number?      Phone- [phone]  [phone]  Anesthesia type (None, local, MAC, general) ?       MAC

## 2018-07-30 NOTE — Telephone Encounter (Signed)
Please comment on xarelto. 

## 2018-08-02 ENCOUNTER — Encounter: Payer: Self-pay | Admitting: Gastroenterology

## 2018-08-06 ENCOUNTER — Other Ambulatory Visit: Payer: Self-pay

## 2018-08-06 ENCOUNTER — Ambulatory Visit (HOSPITAL_COMMUNITY)
Admission: RE | Admit: 2018-08-06 | Discharge: 2018-08-06 | Disposition: A | Discharge status: Home / Self Care | Payer: 59 | Source: Ambulatory Visit | Attending: Gastroenterology | Admitting: Gastroenterology

## 2018-08-06 ENCOUNTER — Other Ambulatory Visit: Payer: Self-pay | Admitting: Gastroenterology

## 2018-08-06 DIAGNOSIS — Z860100 Personal history of colon polyps, unspecified: Secondary | ICD-10-CM

## 2018-08-06 DIAGNOSIS — I4891 Unspecified atrial fibrillation: Secondary | ICD-10-CM | POA: Diagnosis present

## 2018-08-06 DIAGNOSIS — R1310 Dysphagia, unspecified: Secondary | ICD-10-CM | POA: Diagnosis present

## 2018-08-06 DIAGNOSIS — K219 Gastro-esophageal reflux disease without esophagitis: Secondary | ICD-10-CM | POA: Insufficient documentation

## 2018-08-07 ENCOUNTER — Telehealth: Payer: Self-pay

## 2018-08-07 LAB — BASIC METABOLIC PANEL
BUN/Creatinine Ratio: 24 — ABNORMAL HIGH (ref 9–20)
BUN: 28 mg/dL — ABNORMAL HIGH (ref 6–24)
CO2: 21 mmol/L (ref 20–29)
Calcium: 9.1 mg/dL (ref 8.7–10.2)
Chloride: 100 mmol/L (ref 96–106)
Creatinine, Ser: 1.16 mg/dL (ref 0.76–1.27)
GFR calc Af Amer: 84 mL/min/{1.73_m2} (ref >59)
GFR calc non Af Amer: 73 mL/min/{1.73_m2} (ref >59)
Glucose: 151 mg/dL — ABNORMAL HIGH (ref 65–99)
Potassium: 4.2 mmol/L (ref 3.5–5.2)
Sodium: 144 mmol/L (ref 134–144)

## 2018-08-07 LAB — PRO B NATRIURETIC PEPTIDE: NT-Pro BNP: 753 pg/mL — ABNORMAL HIGH (ref 0–121)

## 2018-08-07 NOTE — Telephone Encounter (Signed)
YOUR CARDIOLOGY TEAM HAS ARRANGED FOR AN E-VISIT FOR YOUR APPOINTMENT - PLEASE REVIEW IMPORTANT INFORMATION BELOW SEVERAL DAYS PRIOR TO YOUR APPOINTMENT  Due to the recent COVID-19 pandemic, we are transitioning in-person office visits to tele-medicine visits in an effort to decrease unnecessary exposure to our patients, their families, and staff. These visits are billed to your insurance just like a normal visit is. We also encourage you to sign up for MyChart if you have not already done so. You will need a smartphone if possible. For patients that do not have this, we can still complete the visit using a regular telephone but do prefer a smartphone to enable video when possible. You may have a family member that lives with you that can help. If possible, we also ask that you have a blood pressure cuff and scale at home to measure your blood pressure, heart rate and weight prior to your scheduled appointment. Patients with clinical needs that need an in-person evaluation and testing will still be able to come to the office if absolutely necessary. If you have any questions, feel free to call our office.     YOUR PROVIDER WILL BE USING THE FOLLOWING PLATFORM TO COMPLETE YOUR VISIT: DoxyMe  . IF USING DOXIMITY or [email or web address].ME - The staff will give you instructions on receiving your link to join the meeting the day of your visit.      2-3 DAYS BEFORE YOUR APPOINTMENT  You will receive a telephone call from one of our [facility] team members - your caller ID may say "Unknown caller." If this is a video visit, we will walk you through how to get the video launched on your phone. We will remind you check your blood pressure, heart rate and weight prior to your scheduled appointment. If you have an Apple Watch or Kardia, please upload any pertinent ECG strips the day before or morning of your appointment to [place]. Our staff will also make sure you have reviewed the consent and agree to move forward with your  scheduled tele-health visit.     THE DAY OF YOUR APPOINTMENT  Approximately 15 minutes prior to your scheduled appointment, you will receive a telephone call from one of [facility] team - your caller ID may say "Unknown caller."  Our staff will confirm medications, vital signs for the day and any symptoms you may be experiencing. Please have this information available prior to the time of visit start. It may also be helpful for you to have a pad of paper and pen handy for any instructions given during your visit. They will also walk you through joining the smartphone meeting if this is a video visit.    CONSENT FOR TELE-HEALTH VISIT - PLEASE REVIEW  I hereby voluntarily request, consent and authorize [facility] and its employed or contracted physicians, physician assistants, nurse practitioners or other licensed health care professionals (the Practitioner), to provide me with telemedicine health care services (the "Services") as deemed necessary by the treating Practitioner. I acknowledge and consent to receive the Services by the Practitioner via telemedicine. I understand that the telemedicine visit will involve communicating with the Practitioner through live audiovisual communication technology and the disclosure of certain medical information by electronic transmission. I acknowledge that I have been given the opportunity to request an in-person assessment or other available alternative prior to the telemedicine visit and am voluntarily participating in the telemedicine visit.  I understand that I have the right to withhold or withdraw my consent to the  use of telemedicine in the course of my care at any time, without affecting my right to future care or treatment, and that the Practitioner or I may terminate the telemedicine visit at any time. I understand that I have the right to inspect all information obtained and/or recorded in the course of the telemedicine visit and may receive copies of  available information for a reasonable fee.  I understand that some of the potential risks of receiving the Services via telemedicine include:  Marland Kitchen Delay or interruption in medical evaluation due to technological equipment failure or disruption; . Information transmitted may not be sufficient (e.g. poor resolution of images) to allow for appropriate medical decision making by the Practitioner; and/or  . In rare instances, security protocols could fail, causing a breach of personal health information.  Furthermore, I acknowledge that it is my responsibility to provide information about my medical history, conditions and care that is complete and accurate to the best of my ability. I acknowledge that Practitioner's advice, recommendations, and/or decision may be based on factors not within their control, such as incomplete or inaccurate data provided by me or distortions of diagnostic images or specimens that may result from electronic transmissions. I understand that the practice of medicine is not an exact science and that Practitioner makes no warranties or guarantees regarding treatment outcomes. I acknowledge that I will receive a copy of this consent concurrently upon execution via email to the email address I last provided but may also request a printed copy by calling the office of [facility].    I understand that my insurance will be billed for this visit.   I have read or had this consent read to me. . I understand the contents of this consent, which adequately explains the benefits and risks of the Services being provided via telemedicine.  . I have been provided ample opportunity to ask questions regarding this consent and the Services and have had my questions answered to my satisfaction. . I give my informed consent for the services to be provided through the use of telemedicine in my medical care  By participating in this telemedicine visit I agree to the above.  Patient gave verbal  consent for virtual visit with Dr Dulce Sellar on 08-08-18.

## 2018-08-08 ENCOUNTER — Encounter: Payer: Self-pay | Admitting: Cardiology

## 2018-08-08 ENCOUNTER — Telehealth (INDEPENDENT_AMBULATORY_CARE_PROVIDER_SITE_OTHER): Payer: 59 | Admitting: Cardiology

## 2018-08-08 ENCOUNTER — Telehealth: Payer: Self-pay | Admitting: Gastroenterology

## 2018-08-08 VITALS — Temp 98.2°F | Ht 75.5 in | Wt 361.0 lb

## 2018-08-08 DIAGNOSIS — I119 Hypertensive heart disease without heart failure: Secondary | ICD-10-CM

## 2018-08-08 DIAGNOSIS — I420 Dilated cardiomyopathy: Secondary | ICD-10-CM

## 2018-08-08 DIAGNOSIS — I2510 Atherosclerotic heart disease of native coronary artery without angina pectoris: Secondary | ICD-10-CM

## 2018-08-08 DIAGNOSIS — I4819 Other persistent atrial fibrillation: Secondary | ICD-10-CM

## 2018-08-08 DIAGNOSIS — I10 Essential (primary) hypertension: Secondary | ICD-10-CM

## 2018-08-08 DIAGNOSIS — Z7901 Long term (current) use of anticoagulants: Secondary | ICD-10-CM

## 2018-08-08 DIAGNOSIS — Z79899 Other long term (current) drug therapy: Secondary | ICD-10-CM

## 2018-08-08 HISTORY — DX: Essential (primary) hypertension: I10

## 2018-08-08 NOTE — Patient Instructions (Addendum)
Medication Instructions:  Your physician has recommended you make the following change in your medication:: May take an extra metoprolol tablet (50 mg) if you feel your heart rate is rapid.    If you need a refill on your cardiac medications before your next appointment, please call your pharmacy.   Lab work: None If you have labs (blood work) drawn today and your tests are completely normal, you will receive your results only by: Marland Kitchen MyChart Message (if you have MyChart) OR . A paper copy in the mail If you have any lab test that is abnormal or we need to change your treatment, we will call you to review the results.  Testing/Procedures: EKG tomorrow  Follow-Up: At [facility], you and your health needs are our priority.  As part of our continuing mission to provide you with exceptional heart care, we have created designated Provider Care Teams.  These Care Teams include your primary Cardiologist (physician) and Advanced Practice Providers (APPs -  Physician Assistants and Nurse Practitioners) who all work together to provide you with the care you need, when you need it. You will need a follow up appointment in 1 days.   Any Other Special Instructions Will Be Listed Below (If Applicable).

## 2018-08-08 NOTE — Telephone Encounter (Signed)
I have canceled patients procedure appointment and patient was notified.

## 2018-08-08 NOTE — Progress Notes (Signed)
Virtual Visit via Video Note   This visit type was conducted due to national recommendations for restrictions regarding the COVID-19 Pandemic (e.g. social distancing) in an effort to limit this patient's exposure and mitigate transmission in our community.  Due to his co-morbid illnesses, this patient is at least at moderate risk for complications without adequate follow up.  This format is felt to be most appropriate for this patient at this time.  All issues noted in this document were discussed and addressed.  A limited physical exam was performed with this format.  Please refer to the patient's chart for his consent to telehealth for [facility].   Date:  08/08/2018   ID:  Jesus Barnes, DOB December 18, 1970, MRN [id number]  Patient Location: Home Provider Location: Office  PCP:  Jesus Sellar, NP  Cardiologist:  Jesus Herrlich, MD  Electrophysiologist:  None ?Dr. Johney Frame  Evaluation Performed:  Follow-Up Visit  Chief Complaint:  48 yo male presents for 2 week follow up of recurrent atrial fibrillation on dofetilide.  History of Present Illness:    Jesus Barnes is a 48 y.o. male with PMH atrial fibrillation s/p previous catheter ablation and failed DCCV on dofetilide. Additional PMH HTN, chronic anticoagulation, hiatal hernia, esophageal stricture with previous esophageal dilation, mild nonobstructive CAD at cath Jan 2018. He was seen by Dr. Bing Matter 4/28 for chest discomfort and had missed 3 doses of dofetilide, found to be in atrial fibrillation. Last seen by me 07/16/18 - echo was ordered, increased beta-blocker, decrease dofetilide dose due to prolonged QTC and planning for DCCV. After results of his echocardiogram he was started on loop diuretic. Since that visit he has been seen by Dr. Chales Abrahams of GI and underwent UGI on 08/06/2018.   Increase in ProBNP: 320 on 07/16/18 to 753 on 08/06/18. 07/16/18 ProBNP 321 Trop <0.01 TSH 2.05 T3128 T46 08/06/18 K4.2 creatinine 1.16 GFR 73 ProBNP  753  Echo 07/17/18: EF 55-60%, mild LV dilation, mod LVH, pseudonormalization of diastolic dopplers. LA severely dilated. RA mod dilated. (full report under reviewed cardiac procedures)  UGI 08/06/18: "Normal esophagram. Mild gastroesophageal reflux. Small hiatal hernia. Absent mucosal pattern within the gastric body and antrum suggests ATROPHIC GASTRITIS. Recommend upper endoscopy for further evaluation. Small duodenum diverticulum."  He is disappointed his endoscopy was cancelled as he cannot be off of his anticoagulant. Denies any missed doses of anticoagulant. He denies palpitations, but endorses SOB and fatigue. He checks his vitals during our visit and has a HR of 131, I presume he is still in atrial fibrillation. Advised he may take an additional Metoprolol tablet if he feels his heart racing. Due to symptomatic atrial fibrillation despite dofetilide and metoprolol will plan for DCCV.   He reports episodes of "chest burning" midsternal behind his ribs radiating down right arm. Associates it with sweating and anxiety. He states this has happened 7 times over the last 3 months and is similar to the episode that took him to urgent care 07/09/18. Anticipate this is demand ischemia related to atrial fibrillation with rapid ventricular rate.   The patient does not have symptoms concerning for COVID-19 infection (fever, chills, cough, or new shortness of breath).    Past Medical History:  Diagnosis Date   Anxiety 09/02/2014   Burn    Dysphagia    Esophageal stricture 09/02/2014   Essential hypertension 09/01/2014   GERD (gastroesophageal reflux disease)    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HCC)    Hypertension    Morbid obesity with body mass index (BMI)  of 45.0 to 49.9 in adult [facility](HCC) 04/05/2016   Obstructive sleep apnea    awaiting treatment   On amiodarone therapy 10/16/2016   Palpitation    Persistent atrial fibrillation 09/01/2014   Overview:  CHADS2=1   Past Surgical History:  Procedure  Laterality Date   APPENDECTOMY     CARDIOVERSION     CARDIOVERSION N/A 12/14/2016   Procedure: CARDIOVERSION;  Surgeon: Marinus Mawaylor, Gregg W, MD;  Location: [facility] INVASIVE CV LAB;  Service: Cardiovascular;  Laterality: N/A;   COLONOSCOPY  06/18/2001    Bx: Tubulovillous adenoma. Done by Dr. Charm BargesButler. He was told to come back 5984yr later, but pt didnot.    ESOPHAGOGASTRODUODENOSCOPY  12/17/2014   mild gastritis. Small antral polyps (likely inflammatory). Status post empiric esophageal dilatation   HERNIA REPAIR     umblical   KNEE SURGERY     SKIN GRAFT       No outpatient medications have been marked as taking for the 08/08/18 encounter (Appointment) with Baldo DaubMunley, Veona Bittman J, MD.     Allergies:   Patient has no known allergies.   Social History   Tobacco Use   Smoking status: Former Smoker    Types: Cigars   Smokeless tobacco: Never Used   Tobacco comment: quit 7 years ago  Substance Use Topics   Alcohol use: Yes    Comment: ocassionally   Drug use: No     Family Hx: The patient's family history includes Diabetes in his paternal grandfather; Heart disease in his paternal grandfather. There is no history of Colon cancer or Esophageal cancer.  ROS:   Please see the history of present illness.     All other systems reviewed and are negative.   Prior CV studies:   The following studies were reviewed today:  Echo 07/17/2018:  1. The left ventricle has normal systolic function, with an ejection fraction of 55-60%. The cavity size was mildly dilated. There is moderately increased left ventricular wall thickness. Left ventricular diastolic Doppler parameters are consistent with  pseudonormalization.  2. The right ventricle has normal systolic function. The cavity was normal. There is no increase in right ventricular wall thickness.  3. Left atrial size was severely dilated.  4. Right atrial size was moderately dilated.  5. There is mild mitral annular calcification present.  6.  The ascending aorta is normal in size and structure.  7. There is mild dilatation of the aortic root.  8. The interatrial septum was not assessed.     Labs/Other Tests and Data Reviewed:    EKG:  An ECG dated  was personally reviewed today and demonstrated:    Recent Labs: 10/16/2017: ALT 26 07/11/2018: Hemoglobin 15.4; Magnesium 1.9; Platelets 264 07/16/2018: TSH 2.050 08/06/2018: BUN 28; Creatinine, Ser 1.16; NT-Pro BNP 753; Potassium 4.2; Sodium 144   Recent Lipid Panel No results found for: CHOL, TRIG, HDL, CHOLHDL, LDLCALC, LDLDIRECT  Wt Readings from Last 3 Encounters:  07/30/18 (!) 362 lb (164.2 kg)  07/16/18 (!) 372 lb 9.6 oz (169 kg)  07/11/18 (!) 369 lb (167.4 kg)     Objective:    Vital Signs:  There were no vitals taken for this visit.   VITAL SIGNS:  reviewed GEN:  no acute distress RESPIRATORY:  normal respiratory effort, symmetric expansion, some breathlessness with long sentences PSYCH:  normal affect  ASSESSMENT & PLAN:    1. Persistent atrial fibrillation - He does not feel he is in atrial fibrillation, yet endorses SOB, DOE, and fatigue. HR when  he checks his vitals is 131 and I presume he is in atrial fibrillation. Will plan for repeat EKG tomorrow and subsequent cardioversion. Normal thyroid function 07/16/18. 2. Chronic anticoagulation - No bleeding complications. Denies missed doses of his Xarelto.Continue Xarelto 20mg  every evening.  Emphasized the importance of not missing any doses prior to cardioversion. 3. Dilated cardiomyopathy/Hypertensive heart disease - Echocardiogram 07/17/18 with EF 55-60%, mild LV dilation, mod LVH, pseudonormalization of diastolic dopplers. LA severely dilated. RA mod dilated. (full report under reviewed cardiac procedures). Continue Lasix 40mg  daily. ProBNP 08/06/18 753 up from 07/16/18 320. 08/06/18 K 4.2 - no need for K repletion at this time. Recommend low salt, heart healthy diet.  4. CAD - Reports intermittent episodes of substernal  "chest burning" detailed below. Negative troponin 07/16/2018. Discussed when to present to the ED. Defer ischemia evaluation secondary to rapid atrial fibrillation. Continue GDMT of metoprolol. 5. Chest pain - Intermittent episodes of "chest burning" 7 times over the last 3 months. He was evaluated in Urgent Care 07/09/18 for this. He associates these episodes with sweating and R arm radiation. They do not correlate with activity. Negative troponin 07/16/18. Atypical for CAD - expect these episodes correlate with his rapid atrial fibrillation. If these episodes persist despite DCCV consider ischemia evaluation 6. High risk medication use - Dofetilide dose reduced at last office visit due to mildly prolonged QTc. He will return to my office tomorrow for repeat EKG. Reassess QTc at this time. Of note, he has already failed amiodarone therapy.  She is improved since hospital discharge she has no edema shortness of breath chest pain palpitation or syncope  COVID-19 Education: The signs and symptoms of COVID-19 were discussed with the patient and how to seek care for testing (follow up with PCP or arrange E-visit).  The importance of social distancing was discussed today.  Time:   Today, I have spent 30 minutes with the patient with telehealth technology discussing the above problems.     Medication Adjustments/Labs and Tests Ordered: Current medicines are reviewed at length with the patient today.  Concerns regarding medicines are outlined above.   Tests Ordered: No orders of the defined types were placed in this encounter.   Medication Changes: No orders of the defined types were placed in this encounter.   Disposition:  Follow up in 1 day(s). Return to office tomorrow for EKG and set up cardioversion.   Signed, Jesus Herrlich, MD  08/08/2018 1:10 PM    [facility]

## 2018-08-08 NOTE — Telephone Encounter (Signed)
Heather,  Thx for update. I have reviewed Dr. Hulen Shouts note.  Cannot stop anticoagulation at this time.  He is being considered for DCCV.  Has appointment with Dr. Dulce Sellar today.   Plan: -GI procedures (EGD/colon) once okay from cardiology standpoint.  Unfortunately has to be taken off Xarelto prior to procedures. -Cardiology work-up is more urgent than GI at this time.  Will forward notes to Dr. Dulce Sellar as well

## 2018-08-09 ENCOUNTER — Encounter: Payer: Self-pay | Admitting: Cardiology

## 2018-08-09 ENCOUNTER — Ambulatory Visit (INDEPENDENT_AMBULATORY_CARE_PROVIDER_SITE_OTHER): Payer: 59 | Admitting: Cardiology

## 2018-08-09 ENCOUNTER — Other Ambulatory Visit: Payer: Self-pay

## 2018-08-09 VITALS — Temp 94.3°F | Wt 371.8 lb

## 2018-08-09 DIAGNOSIS — I2510 Atherosclerotic heart disease of native coronary artery without angina pectoris: Secondary | ICD-10-CM

## 2018-08-09 DIAGNOSIS — I4819 Other persistent atrial fibrillation: Secondary | ICD-10-CM

## 2018-08-09 DIAGNOSIS — Z79899 Other long term (current) drug therapy: Secondary | ICD-10-CM

## 2018-08-09 DIAGNOSIS — I10 Essential (primary) hypertension: Secondary | ICD-10-CM

## 2018-08-09 DIAGNOSIS — I119 Hypertensive heart disease without heart failure: Secondary | ICD-10-CM

## 2018-08-09 DIAGNOSIS — I209 Angina pectoris, unspecified: Secondary | ICD-10-CM | POA: Diagnosis not present

## 2018-08-09 DIAGNOSIS — Z7901 Long term (current) use of anticoagulants: Secondary | ICD-10-CM

## 2018-08-09 DIAGNOSIS — Z01812 Encounter for preprocedural laboratory examination: Secondary | ICD-10-CM

## 2018-08-09 DIAGNOSIS — F419 Anxiety disorder, unspecified: Secondary | ICD-10-CM

## 2018-08-09 MED ORDER — NITROGLYCERIN 0.4 MG SL SUBL: 0.4000 mg | SUBLINGUAL_TABLET | SUBLINGUAL | 3 refills | Status: DC | PRN

## 2018-08-09 NOTE — H&P (View-Only) (Signed)
Cardiology Office Note:    Date:  08/09/2018   ID:  Jesus Barnes, DOB 01/15/1971, MRN [id number]  PCP:  Hudnell, Stephanie, NP  Cardiologist:  Brian Munley, MD    Referring MD: Hudnell, Stephanie, NP    ASSESSMENT:    No diagnosis found. PLAN:    In order of problems listed above:  1. Persistent atrial fibrillation - EKG today atrial fib rate 124 with QTc 485. He is symptomatic - endorses fatigue and SOB. Will plan for COVID19 testing Monday and subsequent cardioversion Thursday with Dr. Croitoru. Currently on reduced dose dofetilide.  2. Chronic anticoagulation - NO missed doses Xarelto over the last month. No bleeding complications.  Continue Xarelto. Emphasized importance of not missing any doses of his anticoagulant.  3. High risk medication use - On reduce dose dofetilide. If he is able to maintain SR after cardioversion, continue dofetilide. If not, discontinue and refer to EP for further management. 4. Chest pain - Described as "burning" substernal which occurs intermittently and is associated with diaphoresis and tingling in his arms. Symptoms are atypical and likely associate with afib RVR. If symptoms persist after cardioversion pursue ischemic workup. Given prescription for PRN nitroglycerin.  5. Hypertensive disease and Cardiomyopathy - Echo 07/17/2018 with EF 55-60%, mild LV dilation, moderate LVH. ProBNP 753 on 08/06/18. 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 Continue Lasix 40mg daily.  6. HTN - BP 102/80. He denies dizziness or light headedness. Likely marginal BP secondary to atrial fibrillation 124 bpm. Continue current therapies.    Next appointment: 2 weeks in office.    Medication Adjustments/Labs and Tests Ordered: Current medicines are reviewed at length with the patient today.  Concerns regarding medicines are outlined above.  No orders of the defined types were placed in this encounter.  No  orders of the defined types were placed in this encounter.   No chief complaint on file.   History of Present Illness:    Jesus Barnes is a 48 y.o. male with a hx of atrial fibrillation s/p previous catheter ablation and failed DCCV on dofetilide. Additional PMH HTN, chronic anticoagulation, hiatal hernia, esophageal stricture with previous esophageal dilation, mild nonobstructive CAD at cath Jan 2018. He was seen by Dr. Krasowski 4/28 for chest discomfort and had missed 3 doses of dofetilide, found to be in atrial fibrillation. Last seen by me 07/16/18 - echo was ordered, increased beta-blocker, decrease dofetilide dose due to prolonged QTC and planning for DCCV. After results of his echocardiogram he was started on loop diuretic. Since that visit he has been seen by Dr. Gupta of GI and underwent UGI on 08/06/2018.  He was last seen yesterday via virtual visit.  Compliance with diet, lifestyle and medications: Yes He is brought to the office today in preparation for cardioversion EKG confirms atrial fibrillation controlled rate QTC less than 500 last visit we had reduced his dose of dofetilide he remains highly symptomatic he has been compliant with his anticoagulant for greater than a month and after discussion of benefits risk and options elects to undergo cardioversion.  If successful continue dofetilide if unsuccessful will discontinue and referred for electrophysiologic consultation. Increase in ProBNP: 320 on 07/16/18 to 753 on 08/06/18. 07/16/18 ProBNP 321 Trop <0.01 TSH 2.05 T3128 T46 08/06/18 K4.2 creatinine 1.16 GFR 73 ProBNP 753   Echo 07/17/18: EF 55-60%, mild LV dilation, mod LVH, pseudonormalization of diastolic dopplers. LA severely dilated. RA mod dilated. (  full report under reviewed cardiac procedures)   UGI 08/06/18: "Normal esophagram. Mild gastroesophageal reflux. Small hiatal hernia. Absent mucosal pattern within the gastric body and antrum suggests ATROPHIC GASTRITIS. Recommend upper  endoscopy for further evaluation. Small duodenum diverticulum."  Past Medical History:  Diagnosis Date  . Anxiety 09/02/2014  . Burn   . Dysphagia   . Esophageal stricture 09/02/2014  . Essential hypertension 09/01/2014  . GERD (gastroesophageal reflux disease)   . 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HCC)   . Hypertension   . Morbid obesity with body mass index (BMI) of 45.0 to 49.9 in adult [facility](HCC) 04/05/2016  . Obstructive sleep apnea    awaiting treatment  . On amiodarone therapy 10/16/2016  . Palpitation   . Persistent atrial fibrillation 09/01/2014   Overview:  CHADS2=1    Past Surgical History:  Procedure Laterality Date  . APPENDECTOMY    . CARDIOVERSION    . CARDIOVERSION N/A 12/14/2016   Procedure: CARDIOVERSION;  Surgeon: Marinus Mawaylor, Gregg W, MD;  [facility];  Service: Cardiovascular;  Laterality: N/A;  . COLONOSCOPY  06/18/2001    Bx: Tubulovillous adenoma. Done by Dr. Charm BargesButler. He was told to come back 7020yr later, but pt didnot.   . ESOPHAGOGASTRODUODENOSCOPY  12/17/2014   mild gastritis. Small antral polyps (likely inflammatory). Status post empiric esophageal dilatation  . HERNIA REPAIR     umblical  . KNEE SURGERY    . SKIN GRAFT      Current Medications: Current Meds  Medication Sig  . acetaminophen (TYLENOL 8 HOUR ARTHRITIS PAIN) 650 MG CR tablet Take 650 mg by mouth every 8 (eight) hours as needed for pain.  Marland Kitchen. atorvastatin (LIPITOR) 10 MG tablet Take 10 mg by mouth daily.  Marland Kitchen. buPROPion (WELLBUTRIN XL) 300 MG 24 hr tablet Take 1 tablet by mouth daily.  Marland Kitchen. dofetilide (TIKOSYN) 250 MCG capsule Take 1 capsule (250 mcg total) by mouth 2 (two) times daily.  . famotidine (PEPCID) 20 MG tablet Take 1 tablet (20 mg total) by mouth at bedtime.  . furosemide (LASIX) 40 MG tablet Take 1 tablet (40 mg total) by mouth daily.  Marland Kitchen. lisinopril (ZESTRIL) 10 MG tablet Take 1 tablet by mouth daily.   . metoprolol tartrate (LOPRESSOR) 50 MG tablet Take 75 mg by mouth 2 (two) times daily.  .  pantoprazole (PROTONIX) 40 MG tablet Take 1 tablet (40 mg total) by mouth 2 (two) times daily.  Marland Kitchen. testosterone cypionate (DEPOTESTOTERONE CYPIONATE) 100 MG/ML injection Inject 100 mg into the muscle every 14 (fourteen) days. For IM use only  . VITAMIN D, CHOLECALCIFEROL, PO Take 5,000 Units by mouth daily.  Carlena Hurl. XARELTO 20 MG TABS tablet TAKE ONE TABLET BY MOUTH EVERY EVENING WITH FOOD     Allergies:   Patient has no known allergies.   Social History   Socioeconomic History  . Marital status: Married    Spouse name: Not on file  . Number of children: 1  . Years of education: Not on file  . Highest education level: Not on file  Occupational History  . Occupation: Truck Runner, broadcasting/film/videodriver  Social Needs  . Financial resource strain: Not on file  . Food insecurity:    Worry: Not on file    Inability: Not on file  . Transportation needs:    Medical: Not on file    Non-medical: Not on file  Tobacco Use  . Smoking status: Former Smoker    Types: Cigars  . Smokeless tobacco: Never Used  . Tobacco comment:  quit 7 years ago  Substance and Sexual Activity  . Alcohol use: Yes    Comment: ocassionally  . Drug use: No  . Sexual activity: Not on file  Lifestyle  . Physical activity:    Days per week: Not on file    Minutes per session: Not on file  . Stress: Not on file  Relationships  . Social connections:    Talks on phone: Not on file    Gets together: Not on file    Attends religious service: Not on file    Active member of club or organization: Not on file    Attends meetings of clubs or organizations: Not on file    Relationship status: Not on file  Other Topics Concern  . 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 There is no history of Colon cancer or Esophageal cancer. ROS:   Please see the history of present  illness.    All other systems reviewed and are negative.  EKGs/Labs/Other Studies Reviewed:    The following studies were reviewed today:  EKG:  EKG ordered today and personally reviewed.  The ekg ordered today demonstrates atrial fibrillation course baseline controlled ventricular rates QTC is less than 500 ms  Recent Labs: 10/16/2017: ALT 26 07/11/2018: Hemoglobin 15.4; Magnesium 1.9; Platelets 264 07/16/2018: TSH 2.050 08/06/2018: BUN 28; Creatinine, Ser 1.16; NT-Pro BNP 753; Potassium 4.2; Sodium 144  Recent Lipid Panel No results found for: CHOL, TRIG, HDL, CHOLHDL, VLDL, LDLCALC, LDLDIRECT  Physical Exam:    VS:  Temp (!) 94.3 F (34.6 C)   Wt (!) 371 lb 12.8 oz (168.6 kg)   SpO2 97%   BMI 45.86 kg/m     Wt Readings from Last 3 Encounters:  08/09/18 (!) 371 lb 12.8 oz (168.6 kg)  08/08/18 (!) 361 lb (163.7 kg)  07/30/18 (!) 362 lb (164.2 kg)     GEN: Well nourished, well developed in no acute distress HEENT: Normal NECK: No JVD; No carotid bruits LYMPHATICS: No lymphadenopathy CARDIAC: HR irregular,  no murmurs, rubs, gallops RESPIRATORY:  Clear to auscultation without rales, wheezing or rhonchi  ABDOMEN: Soft, non-tender, non-distended MUSCULOSKELETAL:  Bilateral lower extremity with non-pitting edema; No deformity  SKIN: Warm and clammy NEUROLOGIC:  Alert and oriented x 3 PSYCHIATRIC:  Normal affect, anxious   Signed, Norman Herrlich, MD  08/09/2018 4:08 PM    [facility]

## 2018-08-09 NOTE — Patient Instructions (Addendum)
Medication Instructions:  Your physician has recommended you make the following change in your medication:   START nitroglycerin as needed for chest pain: When having chest pain, stop what you are doing and sit down. Take 1 nitro, wait 5 minutes. Still having chest pain, take 1 nitro, wait 5 minutes. Still having chest pain, take 1 nitro, dial 911. Total of 3 nitro in 15 minutes.   If you need a refill on your cardiac medications before your next appointment, please call your pharmacy.   Lab work: Your physician recommends that you return for lab work today: CBC, BMP.   If you have labs (blood work) drawn today and your tests are completely normal, you will receive your results only by: Marland Kitchen MyChart Message (if you have MyChart) OR . A paper copy in the mail If you have any lab test that is abnormal or we need to change your treatment, we will call you to review the results.  Testing/Procedures: You had an EKG today.   You are scheduled for a Cardioversion on Thursday, 08/15/2018 with Dr. Royann Shivers.  Please arrive at the [facility] (Main Entrance A) at [facility]: [address] [place], [place] [address] at 12:45 pm.   DIET: Nothing to eat or drink after midnight except a sip of water with medications (see medication instructions below)  Medication Instructions: Hold furosemide (lasix) the morning of your procedure until after it is completed  Continue your anticoagulant: xarelto You will need to continue your anticoagulant after your procedure until you are told by your Provider that it is safe to stop   Labs: None needed.  You must have a responsible person to drive you home and stay in the waiting area during your procedure. Failure to do so could result in cancellation.  Bring your insurance cards.  *Special Note: Every effort is made to have your procedure done on time. Occasionally there are emergencies that occur at the hospital that may cause delays. Please be patient  if a delay does occur.    Follow-Up: At [facility], you and your health needs are our priority.  As part of our continuing mission to provide you with exceptional heart care, we have created designated Provider Care Teams.  These Care Teams include your primary Cardiologist (physician) and Advanced Practice Providers (APPs -  Physician Assistants and Nurse Practitioners) who all work together to provide you with the care you need, when you need it. You will need a follow up appointment in 2 weeks.

## 2018-08-09 NOTE — Progress Notes (Signed)
Cardiology Office Note:    Date:  08/09/2018   ID:  Jesus Barnes, DOB [birth year], MRN [id number]  PCP:  Dulce Sellar, NP  Cardiologist:  Norman Herrlich, MD    Referring MD: Dulce Sellar, NP    ASSESSMENT:    No diagnosis found. PLAN:    In order of problems listed above:  1. Persistent atrial fibrillation - EKG today atrial fib rate 124 with QTc 485. He is symptomatic - endorses fatigue and SOB. Will plan for COVID19 testing Monday and subsequent cardioversion Thursday with Dr. Royann Shivers. Currently on reduced dose dofetilide.  2. Chronic anticoagulation - NO missed doses Xarelto over the last month. No bleeding complications.  Continue Xarelto. Emphasized importance of not missing any doses of his anticoagulant.  3. High risk medication use - On reduce dose dofetilide. If he is able to maintain SR after cardioversion, continue dofetilide. If not, discontinue and refer to EP for further management. 4. Chest pain - Described as "burning" substernal which occurs intermittently and is associated with diaphoresis and tingling in his arms. Symptoms are atypical and likely associate with afib RVR. If symptoms persist after cardioversion pursue ischemic workup. Given prescription for PRN nitroglycerin.  5. Hypertensive disease and Cardiomyopathy - Echo 07/17/2018 with EF 55-60%, mild LV dilation, moderate LVH. ProBNP 753 on 08/06/18. 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 Continue Lasix  daily.  6. HTN - BP 102/80. He denies dizziness or light headedness. Likely marginal BP secondary to atrial fibrillation 124 bpm. Continue current therapies.    Next appointment: 2 weeks in office.    Medication Adjustments/Labs and Tests Ordered: Current medicines are reviewed at length with the patient today.  Concerns regarding medicines are outlined above.  No orders of the defined types were placed in this encounter.  No  orders of the defined types were placed in this encounter.   No chief complaint on file.   History of Present Illness:    Jesus Barnes is a 48 y.o. male with a hx of atrial fibrillation s/p previous catheter ablation and failed DCCV on dofetilide. Additional PMH HTN, chronic anticoagulation, hiatal hernia, esophageal stricture with previous esophageal dilation, mild nonobstructive CAD at cath Jan 2018. He was seen by Dr. Bing Matter 4/28 for chest discomfort and had missed 3 doses of dofetilide, found to be in atrial fibrillation. Last seen by me 07/16/18 - echo was ordered, increased beta-blocker, decrease dofetilide dose due to prolonged QTC and planning for DCCV. After results of his echocardiogram he was started on loop diuretic. Since that visit he has been seen by Dr. Chales Abrahams of GI and underwent UGI on 08/06/2018.  He was last seen yesterday via virtual visit.  Compliance with diet, lifestyle and medications: Yes He is brought to the office today in preparation for cardioversion EKG confirms atrial fibrillation controlled rate QTC less than 500 last visit we had reduced his dose of dofetilide he remains highly symptomatic he has been compliant with his anticoagulant for greater than a month and after discussion of benefits risk and options elects to undergo cardioversion.  If successful continue dofetilide if unsuccessful will discontinue and referred for electrophysiologic consultation. Increase in ProBNP: 320 on 07/16/18 to 753 on 08/06/18. 07/16/18 ProBNP 321 Trop <0.01 TSH 2.05 T3128 T46 08/06/18 K4.2 creatinine 1.16 GFR 73 ProBNP 753   Echo 07/17/18: EF 55-60%, mild LV dilation, mod LVH, pseudonormalization of diastolic dopplers. LA severely dilated. RA mod dilated. (  full report under reviewed cardiac procedures)   UGI 08/06/18: "Normal esophagram. Mild gastroesophageal reflux. Small hiatal hernia. Absent mucosal pattern within the gastric body and antrum suggests ATROPHIC GASTRITIS. Recommend upper  endoscopy for further evaluation. Small duodenum diverticulum."  Past Medical History:  Diagnosis Date  . Anxiety 09/02/2014  . Burn   . Dysphagia   . Esophageal stricture 09/02/2014  . Essential hypertension 09/01/2014  . GERD (gastroesophageal reflux disease)   . 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HCC)   . Hypertension   . Morbid obesity with body mass index (BMI) of 45.0 to 49.9 in adult [facility](HCC) 04/05/2016  . Obstructive sleep apnea    awaiting treatment  . On amiodarone therapy 10/16/2016  . Palpitation   . Persistent atrial fibrillation 09/01/2014   Overview:  CHADS2=1    Past Surgical History:  Procedure Laterality Date  . APPENDECTOMY    . CARDIOVERSION    . CARDIOVERSION N/A 12/14/2016   Procedure: CARDIOVERSION;  Surgeon: Marinus Mawaylor, Gregg W, MD;  [facility];  Service: Cardiovascular;  Laterality: N/A;  . COLONOSCOPY  06/18/2001    Bx: Tubulovillous adenoma. Done by Dr. Charm BargesButler. He was told to come back 7020yr later, but pt didnot.   . ESOPHAGOGASTRODUODENOSCOPY  12/17/2014   mild gastritis. Small antral polyps (likely inflammatory). Status post empiric esophageal dilatation  . HERNIA REPAIR     umblical  . KNEE SURGERY    . SKIN GRAFT      Current Medications: Current Meds  Medication Sig  . acetaminophen (TYLENOL 8 HOUR ARTHRITIS PAIN) 650 MG CR tablet Take 650 mg by mouth every 8 (eight) hours as needed for pain.  Marland Kitchen. atorvastatin (LIPITOR) 10 MG tablet Take 10 mg by mouth daily.  Marland Kitchen. buPROPion (WELLBUTRIN XL) 300 MG 24 hr tablet Take 1 tablet by mouth daily.  Marland Kitchen. dofetilide (TIKOSYN) 250 MCG capsule Take 1 capsule (250 mcg total) by mouth 2 (two) times daily.  . famotidine (PEPCID) 20 MG tablet Take 1 tablet (20 mg total) by mouth at bedtime.  . furosemide (LASIX) 40 MG tablet Take 1 tablet (40 mg total) by mouth daily.  Marland Kitchen. lisinopril (ZESTRIL) 10 MG tablet Take 1 tablet by mouth daily.   . metoprolol tartrate (LOPRESSOR) 50 MG tablet Take 75 mg by mouth 2 (two) times daily.  .  pantoprazole (PROTONIX) 40 MG tablet Take 1 tablet (40 mg total) by mouth 2 (two) times daily.  Marland Kitchen. testosterone cypionate (DEPOTESTOTERONE CYPIONATE) 100 MG/ML injection Inject 100 mg into the muscle every 14 (fourteen) days. For IM use only  . VITAMIN D, CHOLECALCIFEROL, PO Take 5,000 Units by mouth daily.  Carlena Hurl. XARELTO 20 MG TABS tablet TAKE ONE TABLET BY MOUTH EVERY EVENING WITH FOOD     Allergies:   Patient has no known allergies.   Social History   Socioeconomic History  . Marital status: Married    Spouse name: Not on file  . Number of children: 1  . Years of education: Not on file  . Highest education level: Not on file  Occupational History  . Occupation: Truck Runner, broadcasting/film/videodriver  Social Needs  . Financial resource strain: Not on file  . Food insecurity:    Worry: Not on file    Inability: Not on file  . Transportation needs:    Medical: Not on file    Non-medical: Not on file  Tobacco Use  . Smoking status: Former Smoker    Types: Cigars  . Smokeless tobacco: Never Used  . Tobacco comment:  quit 7 years ago  Substance and Sexual Activity  . Alcohol use: Yes    Comment: ocassionally  . Drug use: No  . Sexual activity: Not on file  Lifestyle  . Physical activity:    Days per week: Not on file    Minutes per session: Not on file  . Stress: Not on file  Relationships  . Social connections:    Talks on phone: Not on file    Gets together: Not on file    Attends religious service: Not on file    Active member of club or organization: Not on file    Attends meetings of clubs or organizations: Not on file    Relationship status: Not on file  Other Topics Concern  . 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 There is no history of Colon cancer or Esophageal cancer. ROS:   Please see the history of present  illness.    All other systems reviewed and are negative.  EKGs/Labs/Other Studies Reviewed:    The following studies were reviewed today:  EKG:  EKG ordered today and personally reviewed.  The ekg ordered today demonstrates atrial fibrillation course baseline controlled ventricular rates QTC is less than 500 ms  Recent Labs: 10/16/2017: ALT 26 07/11/2018: Hemoglobin 15.4; Magnesium 1.9; Platelets 264 07/16/2018: TSH 2.050 08/06/2018: BUN 28; Creatinine, Ser 1.16; NT-Pro BNP 753; Potassium 4.2; Sodium 144  Recent Lipid Panel No results found for: CHOL, TRIG, HDL, CHOLHDL, VLDL, LDLCALC, LDLDIRECT  Physical Exam:    VS:  Temp (!) 94.3 F (34.6 C)   Wt (!) 371 lb 12.8 oz (168.6 kg)   SpO2 97%   BMI 45.86 kg/m     Wt Readings from Last 3 Encounters:  08/09/18 (!) 371 lb 12.8 oz (168.6 kg)  08/08/18 (!) 361 lb (163.7 kg)  07/30/18 (!) 362 lb (164.2 kg)     GEN: Well nourished, well developed in no acute distress HEENT: Normal NECK: No JVD; No carotid bruits LYMPHATICS: No lymphadenopathy CARDIAC: HR irregular,  no murmurs, rubs, gallops RESPIRATORY:  Clear to auscultation without rales, wheezing or rhonchi  ABDOMEN: Soft, non-tender, non-distended MUSCULOSKELETAL:  Bilateral lower extremity with non-pitting edema; No deformity  SKIN: Warm and clammy NEUROLOGIC:  Alert and oriented x 3 PSYCHIATRIC:  Normal affect, anxious   Signed, Norman Herrlich, MD  08/09/2018 4:08 PM    [facility]

## 2018-08-10 LAB — BASIC METABOLIC PANEL
BUN/Creatinine Ratio: 23 — ABNORMAL HIGH (ref 9–20)
BUN: 20 mg/dL (ref 6–24)
CO2: 24 mmol/L (ref 20–29)
Calcium: 9.5 mg/dL (ref 8.7–10.2)
Chloride: 100 mmol/L (ref 96–106)
Creatinine, Ser: 0.88 mg/dL (ref 0.76–1.27)
GFR calc Af Amer: 116 mL/min/{1.73_m2} (ref >59)
GFR calc non Af Amer: 100 mL/min/{1.73_m2} (ref >59)
Glucose: 103 mg/dL — ABNORMAL HIGH (ref 65–99)
Potassium: 4.5 mmol/L (ref 3.5–5.2)
Sodium: 138 mmol/L (ref 134–144)

## 2018-08-10 LAB — CBC
Hematocrit: 46 % (ref 37.5–51.0)
Hemoglobin: 15.9 g/dL (ref 13.0–17.7)
MCH: 30.5 pg (ref 26.6–33.0)
MCHC: 34.6 g/dL (ref 31.5–35.7)
MCV: 88 fL (ref 79–97)
Platelets: 308 10*3/uL (ref 150–450)
RBC: 5.21 x10E6/uL (ref 4.14–5.80)
RDW: 13.1 % (ref 11.6–15.4)
WBC: 9.1 10*3/uL (ref 3.4–10.8)

## 2018-08-12 ENCOUNTER — Other Ambulatory Visit: Payer: Self-pay

## 2018-08-12 ENCOUNTER — Other Ambulatory Visit (HOSPITAL_COMMUNITY)
Admission: RE | Admit: 2018-08-12 | Discharge: 2018-08-12 | Disposition: A | Discharge status: Home / Self Care | Payer: 59 | Source: Ambulatory Visit | Attending: Cardiovascular Disease | Admitting: Cardiovascular Disease

## 2018-08-12 DIAGNOSIS — Z1159 Encounter for screening for other viral diseases: Secondary | ICD-10-CM | POA: Diagnosis present

## 2018-08-14 LAB — NOVEL CORONAVIRUS, NAA (HOSP ORDER, SEND-OUT TO REF LAB; TAT 18-24 HRS): SARS-CoV-2, NAA: NOT DETECTED

## 2018-08-15 ENCOUNTER — Other Ambulatory Visit: Payer: Self-pay

## 2018-08-15 ENCOUNTER — Ambulatory Visit (HOSPITAL_COMMUNITY): Payer: 59 | Admitting: Anesthesiology

## 2018-08-15 ENCOUNTER — Ambulatory Visit (HOSPITAL_COMMUNITY)
Admission: RE | Admit: 2018-08-15 | Discharge: 2018-08-15 | Disposition: A | Discharge status: Home / Self Care | Payer: 59 | Attending: Cardiovascular Disease | Admitting: Cardiovascular Disease

## 2018-08-15 ENCOUNTER — Encounter (HOSPITAL_COMMUNITY)
Admission: RE | Disposition: A | Discharge status: Home / Self Care | Payer: Self-pay | Source: Home / Self Care | Attending: Cardiovascular Disease

## 2018-08-15 ENCOUNTER — Telehealth: Payer: Self-pay | Admitting: *Deleted

## 2018-08-15 ENCOUNTER — Encounter (HOSPITAL_COMMUNITY): Payer: Self-pay

## 2018-08-15 DIAGNOSIS — R600 Localized edema: Secondary | ICD-10-CM | POA: Insufficient documentation

## 2018-08-15 DIAGNOSIS — Z7901 Long term (current) use of anticoagulants: Secondary | ICD-10-CM | POA: Diagnosis not present

## 2018-08-15 DIAGNOSIS — G4733 Obstructive sleep apnea (adult) (pediatric): Secondary | ICD-10-CM | POA: Insufficient documentation

## 2018-08-15 DIAGNOSIS — I429 Cardiomyopathy, unspecified: Secondary | ICD-10-CM | POA: Insufficient documentation

## 2018-08-15 DIAGNOSIS — I10 Essential (primary) hypertension: Secondary | ICD-10-CM | POA: Insufficient documentation

## 2018-08-15 DIAGNOSIS — F17290 Nicotine dependence, other tobacco product, uncomplicated: Secondary | ICD-10-CM | POA: Insufficient documentation

## 2018-08-15 DIAGNOSIS — Z6841 Body Mass Index (BMI) 40.0 and over, adult: Secondary | ICD-10-CM | POA: Insufficient documentation

## 2018-08-15 DIAGNOSIS — Z8249 Family history of ischemic heart disease and other diseases of the circulatory system: Secondary | ICD-10-CM | POA: Diagnosis not present

## 2018-08-15 DIAGNOSIS — I2510 Atherosclerotic heart disease of native coronary artery without angina pectoris: Secondary | ICD-10-CM | POA: Diagnosis not present

## 2018-08-15 DIAGNOSIS — Z79899 Other long term (current) drug therapy: Secondary | ICD-10-CM | POA: Diagnosis not present

## 2018-08-15 DIAGNOSIS — K219 Gastro-esophageal reflux disease without esophagitis: Secondary | ICD-10-CM | POA: Insufficient documentation

## 2018-08-15 DIAGNOSIS — I4819 Other persistent atrial fibrillation: Secondary | ICD-10-CM | POA: Diagnosis not present

## 2018-08-15 DIAGNOSIS — I4891 Unspecified atrial fibrillation: Secondary | ICD-10-CM | POA: Insufficient documentation

## 2018-08-15 DIAGNOSIS — R0789 Other chest pain: Secondary | ICD-10-CM | POA: Insufficient documentation

## 2018-08-15 HISTORY — PX: CARDIOVERSION: SHX1299

## 2018-08-15 SURGERY — CARDIOVERSION
Anesthesia: General

## 2018-08-15 MED ORDER — SODIUM CHLORIDE 0.9 % IV SOLN
INTRAVENOUS | Status: DC | PRN
  Administered 2018-08-15: 13:00:00 via INTRAVENOUS

## 2018-08-15 MED ORDER — PROPOFOL 10 MG/ML IV BOLUS
INTRAVENOUS | Status: DC | PRN
  Administered 2018-08-15: 25 mg via INTRAVENOUS
  Administered 2018-08-15: 15 mg via INTRAVENOUS
  Administered 2018-08-15: 60 mg via INTRAVENOUS

## 2018-08-15 MED ORDER — LIDOCAINE 2% (20 MG/ML) 5 ML SYRINGE
INTRAMUSCULAR | Status: DC | PRN
  Administered 2018-08-15: 40 mg via INTRAVENOUS

## 2018-08-15 NOTE — Transfer of Care (Signed)
Immediate Anesthesia Transfer of Care Note  Patient: Jesus Barnes  Procedure(s) Performed: CARDIOVERSION (N/A )  Patient Location: PACU and Endoscopy Unit  Anesthesia Type:General  Level of Consciousness: awake, alert  and patient cooperative  Airway & Oxygen Therapy: Patient Spontanous Breathing and Patient connected to nasal cannula oxygen  Post-op Assessment: Report given to RN and Post -op Vital signs reviewed and stable  Post vital signs: Reviewed and stable  Last Vitals:  Vitals Value Taken Time  BP    Temp    Pulse    Resp    SpO2      Last Pain:  Vitals:   08/15/18 1300  TempSrc: Oral  PainSc: 0-No pain         Complications: No apparent anesthesia complications

## 2018-08-15 NOTE — Anesthesia Postprocedure Evaluation (Signed)
Anesthesia Post Note  Patient: Jesus Barnes  Procedure(s) Performed: CARDIOVERSION (N/A )     Patient location during evaluation: PACU Anesthesia Type: General Level of consciousness: awake and alert Pain management: pain level controlled Vital Signs Assessment: post-procedure vital signs reviewed and stable Respiratory status: spontaneous breathing, nonlabored ventilation, respiratory function stable and patient connected to nasal cannula oxygen Cardiovascular status: blood pressure returned to baseline and stable Postop Assessment: no apparent nausea or vomiting Anesthetic complications: no    Last Vitals:  Vitals:   08/15/18 1344 08/15/18 1400  BP: 91/65 103/71  Pulse: 65 67  Resp: (!) 22 20  Temp:    SpO2: 95% 96%    Last Pain:  Vitals:   08/15/18 1400  TempSrc:   PainSc: 0-No pain                 [name]

## 2018-08-15 NOTE — Interval H&P Note (Signed)
History and Physical Interval Note:  08/15/2018 1:21 PM  Jesus Barnes  has presented today for surgery, with the diagnosis of AFIB.  The various methods of treatment have been discussed with the patient and family. After consideration of risks, benefits and other options for treatment, the patient has consented to  Procedure(s): CARDIOVERSION (N/A) as a surgical intervention.  The patient's history has been reviewed, patient examined, no change in status, stable for surgery.  I have reviewed the patient's chart and labs.  Questions were answered to the patient's satisfaction.     Aldina Porta

## 2018-08-15 NOTE — Anesthesia Preprocedure Evaluation (Addendum)
Anesthesia Evaluation  Patient identified by MRN, date of birth, ID band Patient awake    Reviewed: Allergy & Precautions, NPO status , Patient's Chart, lab work & pertinent test results, reviewed documented beta blocker date and time   Airway Mallampati: I  TM Distance: >3 FB Neck ROM: Full    Dental  (+) Teeth Intact, Dental Advisory Given   Pulmonary sleep apnea , former smoker,    breath sounds clear to auscultation       Cardiovascular hypertension, Pt. on medications and Pt. on home beta blockers + CAD  + dysrhythmias Atrial Fibrillation  Rhythm:Irregular Rate:Abnormal     Neuro/Psych Anxiety    GI/Hepatic GERD  Medicated,  Endo/Other    Renal/GU      Musculoskeletal   Abdominal (+) + obese,   Peds  Hematology   Anesthesia Other Findings   Reproductive/Obstetrics                             Echo: 1. The left ventricle has normal systolic function, with an ejection fraction of 55-60%. The cavity size was mildly dilated. There is moderately increased left ventricular wall thickness. Left ventricular diastolic Doppler parameters are consistent with  pseudonormalization.  2. The right ventricle has normal systolic function. The cavity was normal. There is no increase in right ventricular wall thickness.  3. Left atrial size was severely dilated.  4. Right atrial size was moderately dilated.  5. There is mild mitral annular calcification present.  6. The ascending aorta is normal in size and structure.  7. There is mild dilatation of the aortic root.  8. The interatrial septum was not assessed.  Anesthesia Physical Anesthesia Plan  ASA: III  Anesthesia Plan: General   Post-op Pain Management:    Induction: Intravenous  PONV Risk Score and Plan: Propofol infusion and Treatment may vary due to age or medical condition  Airway Management Planned: Mask and Natural  Airway  Additional Equipment: None  Intra-op Plan:   Post-operative Plan:   Informed Consent: I have reviewed the patients History and Physical, chart, labs and discussed the procedure including the risks, benefits and alternatives for the proposed anesthesia with the patient or authorized representative who has indicated his/her understanding and acceptance.       Plan Discussed with: CRNA  Anesthesia Plan Comments:         Anesthesia Quick Evaluation

## 2018-08-15 NOTE — Op Note (Signed)
Procedure: Electrical Cardioversion Indications:  Atrial Fibrillation  Procedure Details:  Consent: Risks of procedure as well as the alternatives and risks of each were explained to the (patient/caregiver).  Consent for procedure obtained.  Time Out: Verified patient identification, verified procedure, site/side was marked, verified correct patient position, special equipment/implants available, medications/allergies/relevent history reviewed, required imaging and test results available.  Performed  Patient placed on cardiac monitor, pulse oximetry, supplemental oxygen as necessary.  Sedation given: IV propofol, Dr. Hart Rochester Pacer pads placed anterior and posterior chest.  Cardioverted 1 time(s).  Cardioversion with synchronized biphasic 200J shock.  Evaluation: Findings: Post procedure EKG shows: NSR Complications: None Patient did tolerate procedure well.  Time Spent Directly with the Patient:  30 minutes   Jesus Barnes 08/15/2018, 1:39 PM

## 2018-08-15 NOTE — Telephone Encounter (Signed)
Pt called to request a note to go back to work after cardioversion in hospital. Please advise.

## 2018-08-16 ENCOUNTER — Encounter: Payer: 59 | Admitting: Gastroenterology

## 2018-08-16 NOTE — Telephone Encounter (Signed)
Please advise. Thanks.  

## 2018-08-16 NOTE — Telephone Encounter (Signed)
May return to work 24 hrs after cardioversion

## 2018-08-19 ENCOUNTER — Other Ambulatory Visit: Payer: Self-pay | Admitting: Cardiology

## 2018-08-19 ENCOUNTER — Encounter: Payer: Self-pay | Admitting: *Deleted

## 2018-08-19 NOTE — Telephone Encounter (Signed)
Patient called back and advised per Dr. Bettina Gavia that patient can return to work with no restrictions at this time. Patient verbalized understanding. A return to work note has been faxed to his work to attention Melissa at [phone] along with his COVID-19 test results per patient's request. No further questions.

## 2018-08-19 NOTE — Telephone Encounter (Signed)
Left message to return call 

## 2018-08-22 NOTE — Progress Notes (Signed)
Cardiology Office Note:    Date:  08/23/2018   ID:  Jesus LahBill E Sedlack, DOB 09/21/1970, MRN [id number]  PCP:  Dulce SellarHudnell, Stephanie, NP  Cardiologist:  Norman HerrlichBrian Estes Lehner, MD   GI: Dr. Chales AbrahamsGupta   Pt may have EGD on or after 09/11/18. Skip 4 doses of Xarelto prior to procedure. Skip 4 doses of Xarelto post procedure.   Referring MD: Dulce SellarHudnell, Stephanie, NP    ASSESSMENT:    1. Paroxysmal atrial fibrillation (HCC)   2. Chronic anticoagulation   3. High risk medication use   4. Dilated cardiomyopathy (HCC)   5. Hypertensive heart disease with chronic diastolic congestive heart failure (HCC)    PLAN:    In order of problems listed above:  1. PAF - Remains in SR after recent cardioversion. Reports feeling well. Fatigue, chest pain, panic episodes, SOB associated with atrial fibrillation have all resolved.  It is important that he address his obesity is a major risk factor with his atrial fibrillation and risk of recurrence.  QT interval is normal today and will keep him on the dosage of 500 mg of Tikosyn twice daily provided his renal function remains normal 2. Chronic anticoagulation - Reports compliance with doses of Xarelto. No bleeding complications.   He asks regarding recommendations for upcoming EGD. He may be scheduled for EGD on or after 09/11/18. He will need to skip 4 doses of Xarelto prior to procedure and skip 4 doses of Xarelto after the procedure. I will route my note in Epic to Dr. Chales AbrahamsGupta. 3. High risk medication usage - Self increased dose of dofetilide to 500mg  BID as he ran out of 250mg  tabs. Previously decreased dose due to prolonged QTc. QTc 434 today, plan to continue 500mg  BID dosage with plan to check renal function today.  4. Hypertensive heart disease with heart failure - BP well controlled today. No LE edema on exam. Denies SOB, DOE. Will repeat proBNP today. Continue current anti-hypertensive regimen. 5. Dilated cardiomyopathy - Stable. Continue good BP control. See above.    Next  appointment: 3 months in office   Medication Adjustments/Labs and Tests Ordered: Current medicines are reviewed at length with the patient today.  Concerns regarding medicines are outlined above.  Orders Placed This Encounter  Procedures   Basic Metabolic Panel (BMET)   Pro b natriuretic peptide (BNP)[id number])   EKG 12-Lead   Meds ordered this encounter  Medications   dofetilide (TIKOSYN) 500 MCG capsule    Sig: Take 1 capsule (500 mcg total) by mouth 2 (two) times daily.    Dispense:  180 capsule    Refill:  2    No chief complaint on file.   History of Present Illness:    Jesus Barnes is a 48 y.o. 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 On 08/15/2018 he underwent successful cardioversion by Dr. Royann Shiversroitoru due to recurrence on atrial fibrillation in the setting of missed doses of dofetilide.  Echo 07/16/00 with EF 55-60%, pseudonormalization of diastolic parameters, moderate LVH. He was initiated on Lasix daily with 08/06/18 ProBNP 753. Remains on reduced-dose Tikosyn due to prolonged QTc, Metoprolol, Xarelto. He is awaiting endoscopy by GI, but not able to safely stop anticoagulation prior to DCCV. Echocardiogram 07/17/18 EF 55-60%, pseudonormal diastolic parameters, moderate LVH.   He is feeling remarkably well after cardioversion. EKG shows  NSR HR 60. Denies chest pain, SOB, DOE, palpitations, fluttering, LE edema. Reports improved energy. Has set two alarms on his phone to remind him to take his medications - in his words "I'm not doing that again" referring to previously missed doses of dofetilide. He ran out of dofetilide 250mg  and has been taking 500mg  - QTc 434 today will have him continue increased dose an recheck kidney function today. Denies esophageal burning  or panic-like episodes he was having previously.   Does still have difficulty with swallowing and would like to undergo EGD with Dr. Chales AbrahamsGupta next month. Safe for EGD on or after July 1st, 2020 and will need to miss 4 doses of Xarelto before the procedure and 4 doses of Xarelto after the procedure.   Compliance with diet, lifestyle and medications: Yes Past Medical History:  Diagnosis Date   Anxiety 09/02/2014   Burn    Dysphagia    Esophageal stricture 09/02/2014   Essential hypertension 09/01/2014   GERD (gastroesophageal reflux disease)    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HCC)    Hypertension    Morbid obesity with body mass index (BMI) of 45.0 to 49.9 in adult (HCC) 04/05/2016   Obstructive sleep apnea    awaiting treatment   On amiodarone therapy 10/16/2016   Palpitation    Persistent atrial fibrillation 09/01/2014   Overview:  CHADS2=1    Past Surgical History:  Procedure Laterality Date   APPENDECTOMY     CARDIOVERSION     CARDIOVERSION N/A 12/14/2016   Procedure: CARDIOVERSION;  Surgeon: Marinus Mawaylor, Gregg W, MD;  Location: [facility];  Service: Cardiovascular;  Laterality: N/A;   CARDIOVERSION N/A 08/15/2018   Procedure: CARDIOVERSION;  Surgeon: Thurmon Fairroitoru, Mihai, MD;  Location: [facility] ENDOSCOPY;  Service: Cardiovascular;  Laterality: N/A;   COLONOSCOPY  06/18/2001    Bx: Tubulovillous adenoma. Done by Dr. Charm BargesButler. He was told to come back 8220yr later, but pt didnot.    ESOPHAGOGASTRODUODENOSCOPY  12/17/2014   mild gastritis. Small antral polyps (likely inflammatory). Status post empiric esophageal dilatation   HERNIA REPAIR     umblical   KNEE SURGERY     SKIN GRAFT      Current Medications: Current Meds  Medication Sig   acetaminophen (TYLENOL 8 HOUR ARTHRITIS PAIN) 650 MG CR tablet Take 1,300 mg by mouth every 8 (eight) hours as needed for pain.    atorvastatin (LIPITOR) 10 MG tablet Take 10 mg by mouth daily.   buPROPion (WELLBUTRIN XL) 300 MG 24 hr tablet Take 300  mg by mouth daily.    Cholecalciferol (VITAMIN D3) 125 MCG (5000 UT) CAPS Take 5,000 Units by mouth daily.   clonazePAM (KLONOPIN) 0.5 MG tablet Take 0.5 mg by mouth every evening.   diclofenac sodium (VOLTAREN) 1 % GEL Apply 1 application topically 4 (four) times daily as needed (pain).   dofetilide (TIKOSYN) 500 MCG capsule Take 1 capsule (500 mcg total) by mouth 2 (two) times daily.   famotidine (PEPCID) 20 MG tablet Take 1 tablet (20 mg total) by mouth at bedtime.   furosemide (LASIX) 40 MG tablet Take 1 tablet (40 mg total) by mouth daily.   lisinopril (ZESTRIL) 10 MG tablet Take 10 mg by mouth daily.    metoprolol tartrate (LOPRESSOR) 50 MG tablet Take 75 mg by mouth 2 (two) times daily.   nitroGLYCERIN (NITROSTAT) 0.4 MG SL tablet Place 1 tablet (0.4 mg total) under the tongue every 5 (five) minutes as needed for chest pain.   pantoprazole (PROTONIX) 40  MG tablet Take 1 tablet (40 mg total) by mouth 2 (two) times daily.   testosterone cypionate (DEPOTESTOTERONE CYPIONATE) 100 MG/ML injection Inject 100 mg into the muscle See admin instructions. For IM use only administer every 14 or 28 days   XARELTO 20 MG TABS tablet TAKE ONE TABLET BY MOUTH EVERY EVENING WITH FOOD   [DISCONTINUED] dofetilide (TIKOSYN) 250 MCG capsule TAKE ONE CAPSULE BY MOUTH 2 TIMES A DAY   [DISCONTINUED] dofetilide (TIKOSYN) 500 MCG capsule Take 500 mcg by mouth 2 (two) times daily.     Allergies:   Patient has no known allergies.   Social History   Socioeconomic History   Marital status: Married    Spouse name: Not on file   Number of children: 1   Years of education: Not on file   Highest education level: Not on file  Occupational History   Occupation: Truck Solicitor strain: Not on file   Food insecurity    Worry: Not on file    Inability: Not on file   Transportation needs    Medical: Not on file    Non-medical: Not on file  Tobacco Use    Smoking status: Former Smoker    Types: Cigars   Smokeless tobacco: Never Used   Tobacco comment: quit 7 years ago  Substance and Sexual Activity   Alcohol use: Yes    Comment: ocassionally   Drug use: No   Sexual activity: Not on file  Lifestyle   Physical activity    Days per week: Not on file    Minutes per session: Not on file   Stress: Not on file  Relationships   Social connections    Talks on phone: Not on file    Gets together: Not on file    Attends religious service: Not on file    Active member of club or organization: Not on file    Attends meetings of clubs or organizations: Not on file    Relationship status: Not on file  Other Topics Concern   Not on file  Social History Narrative   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 There is no history of Colon cancer or Esophageal cancer. ROS:   Please see the history of present illness.    All other systems reviewed and are negative.  EKGs/Labs/Other Studies Reviewed:    The following studies were reviewed today:  07/17/18 Echocardiogram:  1. The left ventricle has normal systolic function, with an ejection fraction of 55-60%. The cavity size was mildly dilated. There is moderately increased left ventricular wall thickness. Left ventricular diastolic Doppler parameters are consistent with  pseudonormalization.  2. The right ventricle has normal systolic function. The cavity was normal. There is no increase in right ventricular wall thickness.  3. Left atrial size was severely dilated.  4. Right atrial size was moderately dilated.  5. There is mild mitral annular calcification present.  6. The ascending aorta is normal in size and structure.  7. There is mild dilatation of the aortic root.  8. The interatrial septum was not assessed.    EKG:  EKG ordered today and personally  reviewed.  The ekg ordered today demonstrates NSR HR 60 QTC 434  Recent Labs: 10/16/2017: ALT 26 07/11/2018: Magnesium 1.9 07/16/2018: TSH 2.050 08/06/2018: NT-Pro BNP 753 08/09/2018: BUN 20; Creatinine,  Ser 0.88; Hemoglobin 15.9; Platelets 308; Potassium 4.5; Sodium 138  Recent Lipid Panel No results found for: CHOL, TRIG, HDL, CHOLHDL, VLDL, LDLCALC, LDLDIRECT  Physical Exam:    VS:  BP 108/72 (BP Location: Right Arm, Patient Position: Sitting, Cuff Size: Large)    Pulse 60    Temp (!) 97.3 F (36.3 C)    Wt (!) 371 lb (168.3 kg)    SpO2 97%    BMI 46.37 kg/m     Wt Readings from Last 3 Encounters:  08/23/18 (!) 371 lb (168.3 kg)  08/15/18 (!) 363 lb (164.7 kg)  08/09/18 (!) 371 lb 12.8 oz (168.6 kg)     GEN:  Well nourished, well developed in no acute distress HEENT: Normal NECK: No JVD; No carotid bruits LYMPHATICS: No lymphadenopathy CARDIAC: RRR, no murmurs, rubs, gallops RESPIRATORY:  Clear to auscultation without rales, wheezing or rhonchi  ABDOMEN: Soft, non-tender, non-distended MUSCULOSKELETAL:  No edema; No deformity  SKIN: Warm and dry NEUROLOGIC:  Alert and oriented x 3 PSYCHIATRIC:  Normal affect    Signed, Norman HerrlichBrian Liboria Putnam, MD  08/23/2018 3:45 PM    [facility]

## 2018-08-23 ENCOUNTER — Ambulatory Visit (INDEPENDENT_AMBULATORY_CARE_PROVIDER_SITE_OTHER): Payer: 59 | Admitting: Cardiology

## 2018-08-23 ENCOUNTER — Encounter: Payer: Self-pay | Admitting: Cardiology

## 2018-08-23 ENCOUNTER — Other Ambulatory Visit: Payer: Self-pay

## 2018-08-23 VITALS — BP 108/72 | HR 60 | Temp 97.3°F | Wt 371.0 lb

## 2018-08-23 DIAGNOSIS — I420 Dilated cardiomyopathy: Secondary | ICD-10-CM

## 2018-08-23 DIAGNOSIS — Z79899 Other long term (current) drug therapy: Secondary | ICD-10-CM | POA: Diagnosis not present

## 2018-08-23 DIAGNOSIS — I110 Hypertensive heart disease with heart failure: Secondary | ICD-10-CM

## 2018-08-23 DIAGNOSIS — I480 Paroxysmal atrial fibrillation: Secondary | ICD-10-CM

## 2018-08-23 DIAGNOSIS — I5032 Chronic diastolic (congestive) heart failure: Secondary | ICD-10-CM

## 2018-08-23 DIAGNOSIS — Z7901 Long term (current) use of anticoagulants: Secondary | ICD-10-CM

## 2018-08-23 MED ORDER — DOFETILIDE 500 MCG PO CAPS: 500.0000 ug | ORAL_CAPSULE | Freq: Two times a day (BID) | ORAL | 2 refills | Status: DC

## 2018-08-23 NOTE — Patient Instructions (Signed)
Medication Instructions:  Your physician has recommended you make the following change in your medication:  TAKE: Tikosyn 500mg  (1 tab) twice daily  If you need a refill on your cardiac medications before your next appointment, please call your pharmacy.   Lab work: Your physician recommends that you return for lab work in: TODAY BMP,Pro BNP  If you have labs (blood work) drawn today and your tests are completely normal, you will receive your results only by: Marland Kitchen MyChart Message (if you have MyChart) OR . A paper copy in the mail If you have any lab test that is abnormal or we need to change your treatment, we will call you to review the results.  Testing/Procedures: NOne  Follow-Up: At [facility], you and your health needs are our priority.  As part of our continuing mission to provide you with exceptional heart care, we have created designated Provider Care Teams.  These Care Teams include your primary Cardiologist (physician) and Advanced Practice Providers (APPs -  Physician Assistants and Nurse Practitioners) who all work together to provide you with the care you need, when you need it. You will need a follow up appointment in 3 months.  Any Other Special Instructions Will Be Listed Below (If Applicable).

## 2018-08-24 LAB — PRO B NATRIURETIC PEPTIDE: NT-Pro BNP: 18 pg/mL (ref 0–121)

## 2018-08-24 LAB — BASIC METABOLIC PANEL
BUN/Creatinine Ratio: 22 — ABNORMAL HIGH (ref 9–20)
BUN: 24 mg/dL (ref 6–24)
CO2: 23 mmol/L (ref 20–29)
Calcium: 9 mg/dL (ref 8.7–10.2)
Chloride: 103 mmol/L (ref 96–106)
Creatinine, Ser: 1.09 mg/dL (ref 0.76–1.27)
GFR calc Af Amer: 91 mL/min/{1.73_m2} (ref >59)
GFR calc non Af Amer: 79 mL/min/{1.73_m2} (ref >59)
Glucose: 90 mg/dL (ref 65–99)
Potassium: 4.3 mmol/L (ref 3.5–5.2)
Sodium: 140 mmol/L (ref 134–144)

## 2018-08-28 ENCOUNTER — Ambulatory Visit (HOSPITAL_COMMUNITY): Payer: 59

## 2018-11-08 ENCOUNTER — Other Ambulatory Visit: Payer: Self-pay | Admitting: Cardiology

## 2018-11-13 NOTE — Progress Notes (Signed)
Cardiology Office Note:    Date:  11/14/2018   ID:  Jesus Barnes, DOB [birth year], MRN [id number]  PCP:  Dulce Sellar, NP  Cardiologist:  Norman Herrlich, MD    Referring MD: Dulce Sellar, NP    ASSESSMENT:    1. Paroxysmal atrial fibrillation (HCC)   2. High risk medication use   3. Chronic anticoagulation   4. Hypertensive heart disease with chronic diastolic congestive heart failure (HCC)   5. Mild CAD   6. Mixed hyperlipidemia    PLAN:    In order of problems listed above:  1. Recurrent paroxysmal atrial fibrillation despite antiarrhythmic drug.  Continue beta-blocker for rate control and on Cardizem 30 mg twice daily and if he continues in atrial fibrillation will plan emergency room cardioversion Tuesday. 2. Continue Tikosyn 3. Continue anticoagulant without interruption 4. Stable hypertension BP at target no evidence of decompensated heart failure continue his current diuretic   Medication Adjustments/Labs and Tests Ordered: Current medicines are reviewed at length with the patient today.  Concerns regarding medicines are outlined above.  No orders of the defined types were placed in this encounter.  No orders of the defined types were placed in this encounter.   Chief Complaint  Patient presents with  . Atrial Fibrillation    History of Present Illness:     Jesus Barnes is a 48 y.o. 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 On 08/15/2018 he underwent successful cardioversion by Dr. Royann Shivers due to recurrence on atrial fibrillation in the setting of missed doses of dofetilide.   Echo 07/16/00 with EF 55-60%, pseudonormalization of diastolic parameters, moderate LVH. He was initiated on Lasix daily with 08/06/18 ProBNP 753. Remains on reduced-dose Tikosyn due to prolonged QTc, Metoprolol,  Xarelto. He is awaiting endoscopy by GI, but not able to safely stop anticoagulation prior to DCCV. Echocardiogram 07/17/18 EF 55-60%, pseudonormal diastolic parameters, moderate LVH.  He was last seen 08/23/2018. Compliance with diet, lifestyle and medications: Yes   Atrial fibrillation since yesterday morning he does not feel well but is not having severe symptoms with chest pain shortness of breath or syncope and has intermittent palpitation.  He has a blood pressure device at home to measure heart rate we discussed options he prefers rate control and observation through the weekend if he is still in atrial fibrillation Tuesday morning he will call me and I will send him to the emergency room for cardioversion.  He is compliant with his anticoagulant and his antiarrhythmic drug is missed no doses.  He has had no bleeding complications of his anticoagulant Past Medical History:  Diagnosis Date  . Anxiety 09/02/2014  . Burn   . Dysphagia   . Esophageal stricture 09/02/2014  . Essential hypertension 09/01/2014  . GERD (gastroesophageal reflux disease)   . 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HCC)   . Hypertension   . Morbid obesity with body mass index (BMI) of 45.0 to 49.9 in adult [facility]) 04/05/2016  . Obstructive sleep apnea    awaiting treatment  . On amiodarone therapy 10/16/2016  . Palpitation   . Persistent atrial fibrillation 09/01/2014   Overview:  CHADS2=1    Past Surgical History:  Procedure Laterality Date  . APPENDECTOMY    . CARDIOVERSION    . CARDIOVERSION N/A 12/14/2016   Procedure: CARDIOVERSION;  Surgeon: Marinus Maw, MD;  Location[facility] INVASIVE CV LAB;  Service: Cardiovascular;  Laterality: N/A;  . CARDIOVERSION N/A 08/15/2018   Procedure: CARDIOVERSION;  Surgeon: Thurmon Fairroitoru, Mihai, MD;  Location: [facility] ENDOSCOPY;  Service: Cardiovascular;  Laterality: N/A;  . COLONOSCOPY  06/18/2001    Bx: Tubulovillous adenoma. Done by Dr. Charm BargesButler. He was told to come back 4982yr later, but pt didnot.   .  ESOPHAGOGASTRODUODENOSCOPY  12/17/2014   mild gastritis. Small antral polyps (likely inflammatory). Status post empiric esophageal dilatation  . HERNIA REPAIR     umblical  . KNEE SURGERY    . SKIN GRAFT      Current Medications: Current Meds  Medication Sig  . atorvastatin (LIPITOR) 10 MG tablet Take 10 mg by mouth daily.  Marland Kitchen. buPROPion (WELLBUTRIN XL) 300 MG 24 hr tablet Take 300 mg by mouth daily.   . Cholecalciferol (VITAMIN D3) 125 MCG (5000 UT) CAPS Take 5,000 Units by mouth daily.  . clonazePAM (KLONOPIN) 0.5 MG tablet Take 0.5 mg by mouth every evening.  . Collagen Hydrolysate, Bovine, POWD 1 Scoop by Does not apply route daily.  Marland Kitchen. dofetilide (TIKOSYN) 500 MCG capsule Take 1 capsule (500 mcg total) by mouth 2 (two) times daily.  . furosemide (LASIX) 40 MG tablet Take 40 mg by mouth daily as needed.  Marland Kitchen. lisinopril (ZESTRIL) 10 MG tablet Take 10 mg by mouth daily.   . metoprolol tartrate (LOPRESSOR) 50 MG tablet Take 75 mg by mouth 2 (two) times daily.  . Multiple Vitamin (MULTIVITAMIN) tablet Take 1 tablet by mouth daily.  . pantoprazole (PROTONIX) 40 MG tablet Take 1 tablet (40 mg total) by mouth 2 (two) times daily. (Patient taking differently: Take 40 mg by mouth daily. )  . XARELTO 20 MG TABS tablet TAKE ONE TABLET BY MOUTH EVERY EVENING WITH FOOD  . Zinc 15 MG CAPS Take 2 capsules by mouth daily.     Allergies:   Patient has no known allergies.   Social History   Socioeconomic History  . Marital status: Married    Spouse name: Not on file  . Number of children: 1  . Years of education: Not on file  . Highest education level: Not on file  Occupational History  . Occupation: Truck Runner, broadcasting/film/videodriver  Social Needs  . Financial resource strain: Not on file  . Food insecurity    Worry: Not on file    Inability: Not on file  . Transportation needs    Medical: Not on file    Non-medical: Not on file  Tobacco Use  . Smoking status: Former Smoker    Types: Cigars  . Smokeless  tobacco: Never Used  . Tobacco comment: quit 7 years ago  Substance and Sexual Activity  . Alcohol use: Yes    Comment: ocassionally  . Drug use: No  . Sexual activity: Not on file  Lifestyle  . Physical activity    Days per week: Not on file    Minutes per session: Not on file  . Stress: Not on file  Relationships  . Social Musicianconnections    Talks on phone: Not on file    Gets together: Not on file    Attends religious service: Not on file    Active member of club or organization: Not on file    Attends meetings of clubs or organizations: Not on file    Relationship status: Not on file  Other Topics Concern  . Not on file  Social History Narrative   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  Family History: The patient's family history includes Diabetes in his paternal grandfather; Heart disease in his paternal grandfather. There is no history of Colon cancer or Esophageal cancer. ROS:   Please see the history of present illness.    All other systems reviewed and are negative.  EKGs/Labs/Other Studies Reviewed:    The following studies were reviewed today:  EKG:  EKG ordered today and personally reviewed.  The ekg ordered today demonstrates rapid atrial fibrillation QT interval normal  Recent Labs: 07/11/2018: Magnesium 1.9 07/16/2018: TSH 2.050 08/09/2018: Hemoglobin 15.9; Platelets 308 08/23/2018: BUN 24; Creatinine, Ser 1.09; NT-Pro BNP 18; Potassium 4.3; Sodium 140  Recent Lipid Panel No results found for: CHOL, TRIG, HDL, CHOLHDL, VLDL, LDLCALC, LDLDIRECT  Physical Exam:    VS:  BP 116/68 (BP Location: Right Arm, Patient Position: Sitting, Cuff Size: Large)   Pulse (!) 118   Wt (!) 379 lb (171.9 kg)   SpO2 96%   BMI 47.37 kg/m     Wt Readings from Last 3 Encounters:  11/14/18 (!) 379 lb (171.9 kg)  08/23/18 (!) 371 lb (168.3 kg)  08/15/18 (!) 363 lb (164.7 kg)     GEN: Marked obesity BMI greater than 45 well nourished, well developed in no acute distress HEENT:  Normal NECK: No JVD; No carotid bruits LYMPHATICS: No lymphadenopathy CARDIAC: Irregular variable first heart sound  no murmurs, rubs, gallops RESPIRATORY:  Clear to auscultation without rales, wheezing or rhonchi  ABDOMEN: Soft, non-tender, non-distended MUSCULOSKELETAL:  No edema; No deformity  SKIN: Warm and dry NEUROLOGIC:  Alert and oriented x 3 PSYCHIATRIC:  Normal affect    Signed, Shirlee More, MD  11/14/2018 4:59 PM    [facility]

## 2018-11-14 ENCOUNTER — Other Ambulatory Visit: Payer: Self-pay

## 2018-11-14 ENCOUNTER — Ambulatory Visit (INDEPENDENT_AMBULATORY_CARE_PROVIDER_SITE_OTHER): Payer: 59 | Admitting: Cardiology

## 2018-11-14 VITALS — BP 116/68 | HR 118 | Wt 379.0 lb

## 2018-11-14 DIAGNOSIS — I2510 Atherosclerotic heart disease of native coronary artery without angina pectoris: Secondary | ICD-10-CM

## 2018-11-14 DIAGNOSIS — I110 Hypertensive heart disease with heart failure: Secondary | ICD-10-CM

## 2018-11-14 DIAGNOSIS — Z79899 Other long term (current) drug therapy: Secondary | ICD-10-CM | POA: Diagnosis not present

## 2018-11-14 DIAGNOSIS — E782 Mixed hyperlipidemia: Secondary | ICD-10-CM

## 2018-11-14 DIAGNOSIS — I5032 Chronic diastolic (congestive) heart failure: Secondary | ICD-10-CM

## 2018-11-14 DIAGNOSIS — I480 Paroxysmal atrial fibrillation: Secondary | ICD-10-CM

## 2018-11-14 DIAGNOSIS — Z7901 Long term (current) use of anticoagulants: Secondary | ICD-10-CM | POA: Diagnosis not present

## 2018-11-14 MED ORDER — DILTIAZEM HCL 30 MG PO TABS: 30.0000 mg | ORAL_TABLET | Freq: Two times a day (BID) | ORAL | 1 refills | Status: DC

## 2018-11-14 MED ORDER — DILTIAZEM HCL 30 MG PO TABS: ORAL_TABLET | ORAL | 1 refills | Status: DC

## 2018-11-14 NOTE — Patient Instructions (Addendum)
Medication Instructions:  Your physician has recommended you make the following change in your medication:  START: Cardizem 30mg  one tablet twice daily heart rate greater than 100.  If in Afib over the weekend, please call our office on Tuesday, September 8,2020 for instructions to be cardioverted. If cardioversion is needed please do not eat or drink on Tuesday morning.   If you need a refill on your cardiac medications before your next appointment, please call your pharmacy.   Lab work: NONE If you have labs (blood work) drawn today and your tests are completely normal, you will receive your results only by: Marland Kitchen MyChart Message (if you have MyChart) OR . A paper copy in the mail If you have any lab test that is abnormal or we need to change your treatment, we will call you to review the results.  Testing/Procedures: You had an EKG today.  Follow-Up: At [facility], you and your health needs are our priority.  As part of our continuing mission to provide you with exceptional heart care, we have created designated Provider Care Teams.  These Care Teams include your primary Cardiologist (physician) and Advanced Practice Providers (APPs -  Physician Assistants and Nurse Practitioners) who all work together to provide you with the care you need, when you need it. You will need a follow up appointment in 6 weeks.

## 2018-12-31 ENCOUNTER — Ambulatory Visit (INDEPENDENT_AMBULATORY_CARE_PROVIDER_SITE_OTHER): Payer: 59 | Admitting: Cardiology

## 2018-12-31 ENCOUNTER — Other Ambulatory Visit: Payer: Self-pay

## 2018-12-31 ENCOUNTER — Encounter: Payer: Self-pay | Admitting: Cardiology

## 2018-12-31 VITALS — BP 100/76 | HR 128 | Ht 75.0 in | Wt 385.1 lb

## 2018-12-31 DIAGNOSIS — Z7901 Long term (current) use of anticoagulants: Secondary | ICD-10-CM | POA: Diagnosis not present

## 2018-12-31 DIAGNOSIS — I2510 Atherosclerotic heart disease of native coronary artery without angina pectoris: Secondary | ICD-10-CM

## 2018-12-31 DIAGNOSIS — Z79899 Other long term (current) drug therapy: Secondary | ICD-10-CM | POA: Diagnosis not present

## 2018-12-31 DIAGNOSIS — I480 Paroxysmal atrial fibrillation: Secondary | ICD-10-CM | POA: Diagnosis not present

## 2018-12-31 DIAGNOSIS — I110 Hypertensive heart disease with heart failure: Secondary | ICD-10-CM

## 2018-12-31 DIAGNOSIS — Z01812 Encounter for preprocedural laboratory examination: Secondary | ICD-10-CM

## 2018-12-31 DIAGNOSIS — I5032 Chronic diastolic (congestive) heart failure: Secondary | ICD-10-CM

## 2018-12-31 NOTE — Patient Instructions (Addendum)
Medication Instructions:  Your physician recommends that you continue on your current medications as directed. Please refer to the Current Medication list given to you today.  *If you need a refill on your cardiac medications before your next appointment, please call your pharmacy*  Lab Work: You will have lab work today:  BMP and CBC If you have labs (blood work) drawn today and your tests are completely normal, you will receive your results only by:  MyChart Message (if you have MyChart) OR  A paper copy in the mail If you have any lab test that is abnormal or we need to change your treatment, we will call you to review the results.  Testing/Procedures: You had and EKG today.  You have been referred to see an electrophysiologist, Dr. Curt Bears. This appointment has been scheduled for Monday, 01/06/2019, at 1:45 pm in the [facility] office.   You are scheduled for a Cardioversion at [facility].  We will call you with a date and time.     DIET: Nothing to eat or drink after midnight except a sip of water with medications (see medication instructions below)  Medication Instructions: Hold furosemide on the morning of the test.  Continue your anticoagulant: Xarelto You will need to continue your anticoagulant after your procedure until you are told by your provider that it is safe to stop.   You must have a responsible person to drive you home and stay in the waiting area during your procedure. Failure to do so could result in cancellation.  Bring your insurance cards.  *Special Note: Every effort is made to have your procedure done on time. Occasionally there are emergencies that occur at the hospital that may cause delays. Please be patient if a delay does occur.    Follow-Up: At [facility], you and your health needs are our priority.  As part of our continuing mission to provide you with exceptional heart care, we have created designated Provider Care Teams.  These  Care Teams include your primary Cardiologist (physician) and Advanced Practice Providers (APPs -  Physician Assistants and Nurse Practitioners) who all work together to provide you with the care you need, when you need it.  Your next appointment:   1 week   The format for your next appointment:   In Person  Provider:   Laurann Montana, FNP ([place] Office)   Curator cardioversion is the delivery of a jolt of electricity to restore a normal rhythm to the heart. A rhythm that is too fast or is not regular keeps the heart from pumping well. In this procedure, sticky patches or metal paddles are placed on the chest to deliver electricity to the heart from a device. This procedure may be done in an emergency if:  There is low or no blood pressure as a result of the heart rhythm.  Normal rhythm must be restored as fast as possible to protect the brain and heart from further damage.  It may save a life. This procedure may also be done for irregular or fast heart rhythms that are not immediately life-threatening. Tell a health care provider about:  Any allergies you have.  All medicines you are taking, including vitamins, herbs, eye drops, creams, and over-the-counter medicines.  Any problems you or family members have had with anesthetic medicines.  Any blood disorders you have.  Any surgeries you have had.  Any medical conditions you have.  Whether you are pregnant or may be pregnant. What  are the risks? Generally, this is a safe procedure. However, problems may occur, including:  Allergic reactions to medicines.  A blood clot that breaks free and travels to other parts of your body.  The possible return of an abnormal heart rhythm within hours or days after the procedure.  Your heart stopping (cardiac arrest). This is rare. What happens before the procedure? Medicines  Your health care provider may have you start taking: ? Blood-thinning  medicines (anticoagulants) so your blood does not clot as easily. ? Medicines may be given to help stabilize your heart rate and rhythm.  Ask your health care provider about changing or stopping your regular medicines. This is especially important if you are taking diabetes medicines or blood thinners. General instructions  Plan to have someone take you home from the hospital or clinic.  If you will be going home right after the procedure, plan to have someone with you for 24 hours.  Follow instructions from your health care provider about eating or drinking restrictions. What happens during the procedure?  To lower your risk of infection: ? Your health care team will wash or sanitize their hands. ? Your skin will be washed with soap.  An IV tube will be inserted into one of your veins.  You will be given a medicine to help you relax (sedative).  Sticky patches (electrodes) or metal paddles may be placed on your chest.  An electrical shock will be delivered. The procedure may vary among health care providers and hospitals. What happens after the procedure?   Your blood pressure, heart rate, breathing rate, and blood oxygen level will be monitored until the medicines you were given have worn off.  Do not drive for 24 hours if you were given a sedative.  Your heart rhythm will be watched to make sure it does not change. This information is not intended to replace advice given to you by your health care provider. Make sure you discuss any questions you have with your health care provider. Document Released: 02/17/2002 Document Revised: 02/09/2017 Document Reviewed: 09/03/2015 Elsevier Patient Education  2020 [employer].

## 2018-12-31 NOTE — Progress Notes (Signed)
Cardiology Office Note:    Date:  12/31/2018   ID:  Jesus Barnes, DOB [birth year], MRN [id number]  PCP:  Jeanie Sewer, NP  Cardiologist:  Shirlee More, MD    Referring MD: Jeanie Sewer, NP    ASSESSMENT:    1. Paroxysmal atrial fibrillation (HCC)   2. High risk medication use   3. Chronic anticoagulation   4. Hypertensive heart disease with chronic diastolic congestive heart failure ([place])   5. Mild CAD   6. Pre-procedure lab exam    PLAN:    In order of problems listed above:  1. Recurrent atrial fibrillation despite EP ablation and antiarrhythmic drug therapy, plan cardioversion Thursday rapid referral to electrophysiology and likely require further intervention.  If he is refractory to therapy with his rapid rates and heart failure may benefit from AV nodal ablation and permanent pacemaker as an alternative.  For now continue beta-blocker anticoagulant antiarrhythmic drug and as needed Cardizem for rate control.  Labs will be drawn today including CBC and renal function potassium 2. Clinically is failing antiarrhythmic drug therapy continue dofetilide for now 3. Continue anticoagulation without interruption prior to and after cardioversion 4. Stable he has no evidence of decompensated heart failure at this time 5. Stable not having anginal discomfort   Next appointment: 2 weeks with Laurann Montana, NP   Medication Adjustments/Labs and Tests Ordered: Current medicines are reviewed at length with the patient today.  Concerns regarding medicines are outlined above.  Orders Placed This Encounter  Procedures  . CBC  . Basic metabolic panel  . Ambulatory referral to Cardiac Electrophysiology  . EKG 12-Lead   No orders of the defined types were placed in this encounter.   Chief Complaint  Patient presents with  . Atrial Fibrillation    History of Present Illness:    Jesus Barnes is a 48 y.o. 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 On 08/15/2018 he underwent successful cardioversion by Dr. Sallyanne Kuster due to recurrence  last seen 11/14/2018. Compliance with diet, lifestyle and medications: Yes, several times I asked him and he reaffirms each time that he has not missed a dose of his antiarrhythmic drug or his anticoagulant.  He has felt poorly the last few days heart rate has been above 100 and has taken as needed Cardizem.  He is aware he is back in atrial fibrillation with weakness exercise intolerance but not symptoms of heart failure at this time seen in the office with rapid atrial fibrillation.  The plan will be cardioversion Thursday at [facility] in rapid referral to EP as I suspect he will require further interventional therapy failing antiarrhythmic drug dofetilide.  No chest pain syncope bleeding complications anticoagulant orthopnea but does have exertional dyspnea while in atrial fibrillation and previously decompensated heart failure. Past Medical History:  Diagnosis Date  . Anxiety 09/02/2014  . Burn   . Dysphagia   . Esophageal stricture 09/02/2014  . Essential hypertension 09/01/2014  . GERD (gastroesophageal reflux disease)   . 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[place])   . Hypertension   . Morbid obesity with body mass index (BMI) of 45.0 to 49.9 in adult [facility]) 04/05/2016  . Obstructive sleep apnea    awaiting treatment  . On amiodarone therapy 10/16/2016  . Palpitation   . Persistent atrial fibrillation ([place]) 09/01/2014   Overview:  CHADS2=1    Past Surgical  History:  Procedure Laterality Date  . APPENDECTOMY    . CARDIOVERSION    . CARDIOVERSION N/A 12/14/2016   Procedure: CARDIOVERSION;  Surgeon: Marinus Mawaylor, Gregg W, MD;  [facility];  Service: Cardiovascular;  Laterality: N/A;  . CARDIOVERSION N/A 08/15/2018   Procedure: CARDIOVERSION;  Surgeon: Thurmon Fairroitoru,  Mihai, MD;  Location: [facility] ENDOSCOPY;  Service: Cardiovascular;  Laterality: N/A;  . COLONOSCOPY  06/18/2001    Bx: Tubulovillous adenoma. Done by Dr. Charm BargesButler. He was told to come back 6865yr later, but pt didnot.   . ESOPHAGOGASTRODUODENOSCOPY  12/17/2014   mild gastritis. Small antral polyps (likely inflammatory). Status post empiric esophageal dilatation  . HERNIA REPAIR     umblical  . KNEE SURGERY    . SKIN GRAFT      Current Medications: Current Meds  Medication Sig  . atorvastatin (LIPITOR) 10 MG tablet Take 10 mg by mouth daily.  Marland Kitchen. buPROPion (WELLBUTRIN XL) 300 MG 24 hr tablet Take 300 mg by mouth daily.   . Cholecalciferol (VITAMIN D3) 125 MCG (5000 UT) CAPS Take 5,000 Units by mouth daily.  . clonazePAM (KLONOPIN) 0.5 MG tablet Take 0.5 mg by mouth every evening.  . Collagen Hydrolysate, Bovine, POWD 1 Scoop by Does not apply route daily.  Marland Kitchen. diltiazem (CARDIZEM) 30 MG tablet Take 1 tablet twice daily for heart rate greater than 100 beats per minute.  . dofetilide (TIKOSYN) 500 MCG capsule Take 1 capsule (500 mcg total) by mouth 2 (two) times daily.  . furosemide (LASIX) 40 MG tablet Take 40 mg by mouth daily as needed.  Marland Kitchen. lisinopril (ZESTRIL) 10 MG tablet Take 10 mg by mouth daily.   . metoprolol tartrate (LOPRESSOR) 50 MG tablet Take 75 mg by mouth 2 (two) times daily.  . Multiple Vitamin (MULTIVITAMIN) tablet Take 1 tablet by mouth daily.  . nitroGLYCERIN (NITROSTAT) 0.4 MG SL tablet Place 0.4 mg under the tongue every 5 (five) minutes as needed.   . pantoprazole (PROTONIX) 40 MG tablet Take 1 tablet (40 mg total) by mouth 2 (two) times daily.  Marland Kitchen. testosterone (ANDROGEL) 50 MG/5GM (1%) GEL Place 5 g onto the skin 2 (two) times daily.  Carlena Hurl. XARELTO 20 MG TABS tablet TAKE ONE TABLET BY MOUTH EVERY EVENING WITH FOOD  . Zinc 15 MG CAPS Take 2 capsules by mouth daily.     Allergies:   Patient has no known allergies.   Social History   Socioeconomic History  . Marital status: Married     Spouse name: Not on file  . Number of children: 1  . Years of education: Not on file  . Highest education level: Not on file  Occupational History  . Occupation: Truck Runner, broadcasting/film/videodriver  Social Needs  . Financial resource strain: Not on file  . Food insecurity    Worry: Not on file    Inability: Not on file  . Transportation needs    Medical: Not on file    Non-medical: Not on file  Tobacco Use  . Smoking status: Former Smoker    Types: Cigars  . Smokeless tobacco: Never Used  . Tobacco comment: quit 7 years ago  Substance and Sexual Activity  . Alcohol use: Yes    Comment: ocassionally  . Drug use: No  . Sexual activity: Not on file  Lifestyle  . Physical activity    Days per week: Not on file    Minutes per session: Not on file  . Stress: Not on file  Relationships  .  Social Musician on phone: Not on file    Gets together: Not on file    Attends religious service: Not on file    Active member of club or organization: Not on file    Attends meetings of clubs or organizations: Not on file    Relationship status: Not on file  Other Topics Concern  . 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 There is no history of Colon cancer or Esophageal cancer. ROS:   Please see the history of present illness.    All other systems reviewed and are negative.  EKGs/Labs/Other Studies Reviewed:    The following studies were reviewed today:  EKG:  EKG ordered today and personally reviewed.  The ekg ordered today demonstrates atrial fibrillation rapid response normal QT interval  Recent Labs: 07/11/2018: Magnesium 1.9 07/16/2018: TSH 2.050 08/09/2018: Hemoglobin 15.9; Platelets 308 08/23/2018: BUN 24; Creatinine, Ser 1.09; NT-Pro BNP 18; Potassium 4.3; Sodium 140  Recent Lipid Panel No results found for:  CHOL, TRIG, HDL, CHOLHDL, VLDL, LDLCALC, LDLDIRECT  Physical Exam:    VS:  BP 100/76 (BP Location: Right Arm, Patient Position: Sitting, Cuff Size: Large)   Pulse (!) 128   Ht 6\' 3"  (1.905 m)   Wt (!) 385 lb 1.9 oz (174.7 kg)   SpO2 98%   BMI 48.14 kg/m     Wt Readings from Last 3 Encounters:  12/31/18 (!) 385 lb 1.9 oz (174.7 kg)  11/14/18 (!) 379 lb (171.9 kg)  08/23/18 (!) 371 lb (168.3 kg)     GEN:  Well nourished, well developed in no acute distress HEENT: Normal NECK: No JVD; No carotid bruits LYMPHATICS: No lymphadenopathy CARDIAC: Irregular variable first heart sound rapid heart rhythm no murmurs, rubs, gallops RESPIRATORY:  Clear to auscultation without rales, wheezing or rhonchi  ABDOMEN: Soft, non-tender, non-distended MUSCULOSKELETAL:  No edema; No deformity  SKIN: Warm and dry NEUROLOGIC:  Alert and oriented x 3 PSYCHIATRIC:  Normal affect    Signed, 10/23/18, MD  12/31/2018 6:10 PM    [facility]

## 2019-01-01 LAB — BASIC METABOLIC PANEL
BUN/Creatinine Ratio: 22 — ABNORMAL HIGH (ref 9–20)
BUN: 22 mg/dL (ref 6–24)
CO2: 23 mmol/L (ref 20–29)
Calcium: 9.3 mg/dL (ref 8.7–10.2)
Chloride: 104 mmol/L (ref 96–106)
Creatinine, Ser: 1.02 mg/dL (ref 0.76–1.27)
GFR calc Af Amer: 99 mL/min/{1.73_m2} (ref >59)
GFR calc non Af Amer: 85 mL/min/{1.73_m2} (ref >59)
Glucose: 109 mg/dL — ABNORMAL HIGH (ref 65–99)
Potassium: 4.6 mmol/L (ref 3.5–5.2)
Sodium: 141 mmol/L (ref 134–144)

## 2019-01-01 LAB — CBC
Hematocrit: 47.1 % (ref 37.5–51.0)
Hemoglobin: 16.3 g/dL (ref 13.0–17.7)
MCH: 31.3 pg (ref 26.6–33.0)
MCHC: 34.6 g/dL (ref 31.5–35.7)
MCV: 90 fL (ref 79–97)
Platelets: 282 10*3/uL (ref 150–450)
RBC: 5.21 x10E6/uL (ref 4.14–5.80)
RDW: 13.1 % (ref 11.6–15.4)
WBC: 8.6 10*3/uL (ref 3.4–10.8)

## 2019-01-02 ENCOUNTER — Telehealth: Payer: Self-pay

## 2019-01-02 NOTE — Telephone Encounter (Signed)
Left message on patient's voicemail to return call to discuss normal lab results and cardioversion scheduled for 01-03-2019 at [facility].  Patient should arrive at 07:00 for 09:00 procedure.  Will continue efforts.

## 2019-01-03 ENCOUNTER — Encounter: Payer: Self-pay | Admitting: Cardiology

## 2019-01-03 DIAGNOSIS — I480 Paroxysmal atrial fibrillation: Secondary | ICD-10-CM | POA: Diagnosis not present

## 2019-01-06 ENCOUNTER — Other Ambulatory Visit: Payer: Self-pay

## 2019-01-06 ENCOUNTER — Ambulatory Visit (INDEPENDENT_AMBULATORY_CARE_PROVIDER_SITE_OTHER): Payer: 59 | Admitting: Family

## 2019-01-06 ENCOUNTER — Encounter: Payer: Self-pay | Admitting: Cardiology

## 2019-01-06 ENCOUNTER — Encounter: Payer: Self-pay | Admitting: Family

## 2019-01-06 ENCOUNTER — Ambulatory Visit (INDEPENDENT_AMBULATORY_CARE_PROVIDER_SITE_OTHER): Payer: 59 | Admitting: Cardiology

## 2019-01-06 VITALS — BP 116/72 | HR 63 | Ht 75.0 in | Wt 392.4 lb

## 2019-01-06 VITALS — BP 116/72 | HR 63 | Resp 18

## 2019-01-06 DIAGNOSIS — I2510 Atherosclerotic heart disease of native coronary artery without angina pectoris: Secondary | ICD-10-CM

## 2019-01-06 DIAGNOSIS — R0683 Snoring: Secondary | ICD-10-CM | POA: Diagnosis not present

## 2019-01-06 DIAGNOSIS — I110 Hypertensive heart disease with heart failure: Secondary | ICD-10-CM

## 2019-01-06 DIAGNOSIS — I4811 Longstanding persistent atrial fibrillation: Secondary | ICD-10-CM

## 2019-01-06 DIAGNOSIS — Z7901 Long term (current) use of anticoagulants: Secondary | ICD-10-CM

## 2019-01-06 DIAGNOSIS — R400 Somnolence: Secondary | ICD-10-CM | POA: Diagnosis not present

## 2019-01-06 DIAGNOSIS — I5032 Chronic diastolic (congestive) heart failure: Secondary | ICD-10-CM

## 2019-01-06 DIAGNOSIS — Z79899 Other long term (current) drug therapy: Secondary | ICD-10-CM | POA: Diagnosis not present

## 2019-01-06 MED ORDER — RIVAROXABAN 20 MG PO TABS: 20.0000 mg | ORAL_TABLET | Freq: Every day | ORAL | 1 refills | Status: DC

## 2019-01-06 NOTE — Patient Instructions (Addendum)
Medication Instructions:  Your physician recommends that you continue on your current medications as directed. Please refer to the Current Medication list given to you today.  * If you need a refill on your cardiac medications before your next appointment, please call your pharmacy.   Labwork: None ordered  Testing/Procedures: Your physician has recommended that you have a sleep study. This test records several body functions during sleep, including: brain activity, eye movement, oxygen and carbon dioxide blood levels, heart rate and rhythm, breathing rate and rhythm, the flow of air through your mouth and nose, snoring, body muscle movements, and chest and belly movement.  The office will contact you to arrange this testing.  Follow-Up: Your physician wants you to follow-up in: 6 months with Dr. Curt Bears in [facility].  You will receive a reminder letter in the mail two months in advance. If you don't receive a letter, please call our office to schedule the follow-up appointment.   Thank you for choosing [facility]!!   Trinidad Curet, RN [phone]  Any Other Special Instructions Will Be Listed Below (If Applicable).   Bariatric Surgery You have so much to gain by losing weight.  You may have already tried every diet and exercise plan imaginable.  And, you may have sought advice from your family physician, too.   Sometimes, in spite of such diligent efforts, you may not be able to achieve long-term results by yourself.  In cases of severe obesity, bariatric or weight loss surgery is a proven method of achieving long-term weight control.  Our Services Our bariatric surgery programs offer our patients new hope and long-term weight-loss solution.  Since introducing our services in 2003, we have conducted more than 2,400 successful procedures.  Our program is designated as a Programmer, multimedia by the Metabolic and Bariatric Surgery Accreditation and Quality Improvement Program  (MBSAQIP), a IT trainer that sets rigorous patient safety and outcome standards.  Our program is also designated as a Ecologist by [employer].   Our exceptional weight-loss surgery team specializes in diagnosis, treatment, follow-up care, and ongoing support for our patients with severe weight loss challenges.  We currently offer laparoscopic sleeve gastrectomy, gastric bypass, and adjustable gastric band (LAP-BAND).    Attend our [place] Choosing to undergo a bariatric procedure is a big decision, and one that should not be taken lightly.  You now have two options in how you learn about weight-loss surgery - in person or online.  Our objective is to ensure you have all of the information that you need to evaluate the advantages and obligations of this life changing procedure.  Please note that you are not alone in this process, and our experienced team is ready to assist and answer all of your questions.  There are several ways to register for a seminar (either on-line or in person): 1)  Call [phone] 2) Go on-line to [facility] and register for either type of seminar.  [email or web address]

## 2019-01-06 NOTE — Progress Notes (Signed)
Office Visit    Patient Name: Jesus Barnes Date of Encounter: 01/06/2019  Primary Care Provider:  Jeanie Sewer, NP Primary Cardiologist:  Shirlee More, MD Electrophysiologist:  None   Chief Complaint    Jesus Barnes is a 48 y.o. 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   Past Medical History    Past Medical History:  Diagnosis Date  . Anxiety 09/02/2014  . Burn   . Dysphagia   . Esophageal stricture 09/02/2014  . Essential hypertension 09/01/2014  . GERD (gastroesophageal reflux disease)   . 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[place])   . Hypertension   . Morbid obesity with body mass index (BMI) of 45.0 to 49.9 in adult [facility]) 04/05/2016  . Obstructive sleep apnea    awaiting treatment  . On amiodarone therapy 10/16/2016  . Palpitation   . Persistent atrial fibrillation ([place]) 09/01/2014   Overview:  CHADS2=1   Past Surgical History:  Procedure Laterality Date  . APPENDECTOMY    . CARDIOVERSION    . CARDIOVERSION N/A 12/14/2016   Procedure: CARDIOVERSION;  Surgeon: Evans Lance, MD;  Location: [place] CV LAB;  Service: Cardiovascular;  Laterality: N/A;  . CARDIOVERSION N/A 08/15/2018   Procedure: CARDIOVERSION;  Surgeon: Sanda Klein, MD;  Location: [facility] ENDOSCOPY;  Service: Cardiovascular;  Laterality: N/A;  . COLONOSCOPY  06/18/2001    Bx: Tubulovillous adenoma. Done by Dr. Melina Copa. He was told to come back 78yr later, but pt didnot.   . ESOPHAGOGASTRODUODENOSCOPY  12/17/2014   mild gastritis. Small antral polyps (likely inflammatory). Status post empiric esophageal dilatation  . HERNIA REPAIR     umblical  . KNEE SURGERY    . SKIN GRAFT      Allergies  No Known Allergies  History of Present Illness    Jesus Barnes is a 48  y.o. 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 Bettina Gavia and today by Dr. Curt Bears.  DCCV 08/15/18 for recurrent atrial fibrillation due to running out of dofetilide. On 12/31/18 noted with recurrent atrial fibrillation. Planned for DCCV but self-converted prior. He was referred to EP.  Seen by Dr. Curt Bears today. Ordered for sleep study. Referred for bariatric surgery.   Previously on CPAP but did not like his mask. Agreeable to try with new mask and go for sleep study.   Tells me he has lost significant amount of weight (up to 100lbs) in the past by eating right and exercising.   Notes weight gain of 7 lb since last office visit. Has not taken his PRN Lasix.  EKGs/Labs/Other Studies Reviewed:   The following studies were reviewed today: Echo 07/17/18  1. The left ventricle has normal systolic function, with an ejection fraction of 55-60%. The cavity size was mildly dilated. There is moderately increased left ventricular wall thickness. Left ventricular diastolic Doppler parameters are consistent with  pseudonormalization.  2. The right ventricle has normal systolic function. The cavity was normal. There is no increase in right ventricular wall thickness.  3. Left atrial size was severely dilated.  4. Right atrial size was moderately dilated.  5. There is mild mitral annular calcification present.  6. The ascending aorta is normal in size and structure.  7. There is mild dilatation of the aortic root.  8. The interatrial septum was not assessed.   EKG:  No EKG today. SR per EKG done during Dr. Elberta Fortisamnitz' office visit today.  Recent Labs: 07/11/2018: Magnesium 1.9 07/16/2018: TSH 2.050 08/23/2018: NT-Pro BNP 18 12/31/2018: BUN 22;  Creatinine, Ser 1.02; Hemoglobin 16.3; Platelets 282; Potassium 4.6; Sodium 141  Recent Lipid Panel No results found for: CHOL, TRIG, HDL, CHOLHDL, VLDL, LDLCALC, LDLDIRECT  Home Medications   Current Meds  Medication Sig  . atorvastatin (LIPITOR) 10 MG tablet Take 10 mg by mouth daily.  Marland Kitchen. buPROPion (WELLBUTRIN XL) 300 MG 24 hr tablet Take 300 mg by mouth daily.   . Cholecalciferol (VITAMIN D3) 125 MCG (5000 UT) CAPS Take 5,000 Units by mouth daily.  . clonazePAM (KLONOPIN) 0.5 MG tablet Take 0.5 mg by mouth every evening.  . Collagen Hydrolysate, Bovine, POWD 1 Scoop by Does not apply route daily.  Marland Kitchen. diltiazem (CARDIZEM) 30 MG tablet Take 1 tablet twice daily for heart rate greater than 100 beats per minute.  . dofetilide (TIKOSYN) 500 MCG capsule Take 1 capsule (500 mcg total) by mouth 2 (two) times daily.  Marland Kitchen. lisinopril (ZESTRIL) 10 MG tablet Take 10 mg by mouth daily.   . metoprolol tartrate (LOPRESSOR) 50 MG tablet Take 75 mg by mouth 2 (two) times daily.  . Multiple Vitamin (MULTIVITAMIN) tablet Take 1 tablet by mouth daily.  . nitroGLYCERIN (NITROSTAT) 0.4 MG SL tablet Place 0.4 mg under the tongue every 5 (five) minutes as needed.   . pantoprazole (PROTONIX) 40 MG tablet Take 1 tablet (40 mg total) by mouth 2 (two) times daily.  . rivaroxaban (XARELTO) 20 MG TABS tablet Take 1 tablet (20 mg total) by mouth daily with supper.  . testosterone (ANDROGEL) 50 MG/5GM (1%) GEL Place 5 g onto the skin 2 (two) times daily.  . Zinc 15 MG CAPS Take 2 capsules by mouth daily.    Review of Systems       Review of Systems  Constitution: Negative for chills, fever and malaise/fatigue.  Cardiovascular: Negative for chest pain, dyspnea on exertion, irregular heartbeat, leg swelling, near-syncope, orthopnea and palpitations.  Respiratory: Negative for cough, shortness of breath and wheezing.   Gastrointestinal: Negative for nausea and vomiting.  Neurological: Positive for excessive daytime  sleepiness. Negative for dizziness, light-headedness and weakness.   All other systems reviewed and are otherwise negative except as noted above.  Physical Exam    VS:  BP 116/72   Pulse 63   Resp 18   SpO2 97%  , BMI There is no height or weight on file to calculate BMI. GEN: Well nourished, well developed, in no acute distress. HEENT: normal. Neck: Supple, no JVD, carotid bruits, or masses. Cardiac: RRR, no murmurs, rubs, or gallops. No clubbing, cyanosis, edema.  Radials/DP/PT 2+ and equal bilaterally.  Respiratory:  Respirations regular and unlabored, clear to auscultation bilaterally. GI: Soft, nontender, nondistended, BS + x 4. MS: No deformity or atrophy. Skin: Warm and dry, no rash. Neuro:  Strength and sensation are intact. Psych: Normal affect.   Assessment & Plan    1. PAF - Cardioverion 08/2018 in setting of missed doses of dofetilide. Recent recurrence afib - likely trigger etoh. Continue management with beta blocker, dofetilide, PRN cardizem, Xarelto. Recently re-established with EP Dr. Elberta Fortisamnitz.   Referred for sleep study today  by Dr. Elberta Fortis. Previously intolerant of CPAP mask, agreeable to try again.   Referred to bariatric surgery today by Dr. Elberta Fortis as weight likely contributor to recurrent PAF.   2. High risk medication use - Dofetilide secondary to PAF. Denies missed doses. Continue to follow with EP.  3. Chronic anticoagulation - Secondary to PAF. No bleeding complications. Strongly emphasized need to abstain from etoh while on anticoagulation.  4. Hypertensive heart disease with chronic diastolic congestive heart failure - BP well controlled today. Weight up 7lbs. Likely due to recent PAF recurrence. Take Lasix 40mg  daily until weight returns to 385lb then return to PRN dosing.   5. Mild CAD - By cath 03/2016. GDMT beta blocker, statin. No aspirin secondary to chronic anticoagulation.  Disposition: Follow up in 3 month(s) with Dr. 04/2016, NP 01/06/2019, 3:53 PM

## 2019-01-06 NOTE — Progress Notes (Signed)
Electrophysiology Office Note   Date:  01/06/2019   ID:  NOHLAN BURDIN, DOB [birth year], MRN [id number]  PCP:  Jeanie Sewer, NP  Cardiologist:  Bettina Gavia Primary Electrophysiologist:  Will Meredith Leeds, MD    Chief Complaint: AF   History of Present Illness: CHARLE CLEAR is a 48 y.o. male who is being seen today for the evaluation of AF at the request of Jeanie Sewer, NP. Presenting today for electrophysiology evaluation.  He has a history significant for paroxysmal atrial fibrillation, hypertension, mild coronary artery disease.  He is currently on dofetilide.  He has had an ablation for atrial fibrillation in the past.  He continues to have episodes of atrial fibrillation despite dofetilide.  He had a cardioversion 08/15/2018, but unfortunately has gone back into atrial fibrillation.    He also has a tachycardia mediated cardiomyopathy.  He has morbid obesity weighing as much is 400 pounds at one point, but is currently down to 350 pounds.  He does have sleep apnea.  He did previously have his weight below 300 pounds.  He is since gained quite a bit of weight.  He says that he has trouble with exercising due to knee pain.  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  He does understand that alcohol is a trigger for his atrial fibrillation.  He says that most times that he goes out of rhythm it is a few days after having quite a bit of alcohol and sleeping poorly.  Today, he denies symptoms of palpitations, chest pain, shortness of breath, orthopnea, PND, lower extremity edema, claudication, dizziness, presyncope, syncope, bleeding, or neurologic sequela. The patient is tolerating medications without difficulties.    Past Medical History:  Diagnosis Date  . Anxiety 09/02/2014  . Burn   . Dysphagia   . Esophageal stricture 09/02/2014  . Essential hypertension 09/01/2014  . GERD (gastroesophageal  reflux disease)   . 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[place])   . Hypertension   . Morbid obesity with body mass index (BMI) of 45.0 to 49.9 in adult [facility]) 04/05/2016  . Obstructive sleep apnea    awaiting treatment  . On amiodarone therapy 10/16/2016  . Palpitation   . Persistent atrial fibrillation ([place]) 09/01/2014   Overview:  CHADS2=1   Past Surgical History:  Procedure Laterality Date  . APPENDECTOMY    . CARDIOVERSION    . CARDIOVERSION N/A 12/14/2016   Procedure: CARDIOVERSION;  Surgeon: Evans Lance, MD;  Location: [facility];  Service: Cardiovascular;  Laterality: N/A;  . CARDIOVERSION N/A 08/15/2018   Procedure: CARDIOVERSION;  Surgeon: Sanda Klein, MD;  Location[facility];  Service: Cardiovascular;  Laterality: N/A;  . COLONOSCOPY  06/18/2001    Bx: Tubulovillous adenoma. Done by Dr. Melina Copa. He was told to come back 19yr later, but pt didnot.   . ESOPHAGOGASTRODUODENOSCOPY  12/17/2014   mild gastritis. Small antral polyps (likely inflammatory). Status post empiric esophageal dilatation  . HERNIA REPAIR     umblical  . KNEE SURGERY    . SKIN GRAFT       Current Outpatient Medications  Medication Sig Dispense Refill  . atorvastatin (LIPITOR) 10 MG tablet Take 10 mg by mouth daily.    Marland Kitchen buPROPion (WELLBUTRIN XL) 300 MG 24 hr tablet Take 300 mg by mouth daily.     . Cholecalciferol (VITAMIN D3) 125 MCG (5000 UT) CAPS Take 5,000 Units  by mouth daily.    . clonazePAM (KLONOPIN) 0.5 MG tablet Take 0.5 mg by mouth every evening.    . Collagen Hydrolysate, Bovine, POWD 1 Scoop by Does not apply route daily.    Marland Kitchen diltiazem (CARDIZEM) 30 MG tablet Take 1 tablet twice daily for heart rate greater than 100 beats per minute. 60 tablet 1  . dofetilide (TIKOSYN) 500 MCG capsule Take 1 capsule (500 mcg total) by mouth 2 (two) times daily. 180 capsule 2  . furosemide (LASIX) 40 MG tablet Take 40 mg by mouth daily as needed.    Marland Kitchen lisinopril (ZESTRIL) 10 MG tablet Take 10 mg by mouth daily.      . metoprolol tartrate (LOPRESSOR) 50 MG tablet Take 75 mg by mouth 2 (two) times daily.    . Multiple Vitamin (MULTIVITAMIN) tablet Take 1 tablet by mouth daily.    . nitroGLYCERIN (NITROSTAT) 0.4 MG SL tablet Place 0.4 mg under the tongue every 5 (five) minutes as needed.     . pantoprazole (PROTONIX) 40 MG tablet Take 1 tablet (40 mg total) by mouth 2 (two) times daily. 60 tablet 3  . testosterone (ANDROGEL) 50 MG/5GM (1%) GEL Place 5 g onto the skin 2 (two) times daily.    Carlena Hurl 20 MG TABS tablet TAKE ONE TABLET BY MOUTH EVERY EVENING WITH FOOD 90 tablet 3  . Zinc 15 MG CAPS Take 2 capsules by mouth daily.     No current facility-administered medications for this visit.     Allergies:   Patient has no known allergies.   Social History:  The patient  reports that he has quit smoking. His smoking use included cigars. He has never used smokeless tobacco. He reports current alcohol use. He reports that he does not use drugs.   Family History:  The patient's family history includes Diabetes in his paternal grandfather; Heart disease in his paternal grandfather.    ROS:  Please see the history of present illness.   Otherwise, review of systems is positive for none.   All other systems are reviewed and negative.    PHYSICAL EXAM: VS:  BP 116/72   Pulse 63   Ht 6\' 3"  (1.905 m)   Wt (!) 392 lb 6.4 oz (178 kg)   SpO2 97%   BMI 49.05 kg/m  , BMI Body mass index is 49.05 kg/m. GEN: Well nourished, well developed, in no acute distress  HEENT: normal  Neck: no JVD, carotid bruits, or masses Cardiac: RRR; no murmurs, rubs, or gallops,no edema  Respiratory:  clear to auscultation bilaterally, normal work of breathing GI: soft, nontender, nondistended, + BS MS: no deformity or atrophy  Skin: warm and dry Neuro:  Strength and sensation are intact Psych: euthymic mood, full affect  EKG:  EKG is ordered today. Personal review of the ekg ordered shows sinus rhythm, inferior infarct,  rate 63, QTc 472 ms  Recent Labs: 07/11/2018: Magnesium 1.9 07/16/2018: TSH 2.050 08/23/2018: NT-Pro BNP 18 12/31/2018: BUN 22; Creatinine, Ser 1.02; Hemoglobin 16.3; Platelets 282; Potassium 4.6; Sodium 141    Lipid Panel  No results found for: CHOL, TRIG, HDL, CHOLHDL, VLDL, LDLCALC, LDLDIRECT   Wt Readings from Last 3 Encounters:  01/06/19 (!) 392 lb 6.4 oz (178 kg)  12/31/18 (!) 385 lb 1.9 oz (174.7 kg)  11/14/18 (!) 379 lb (171.9 kg)      Other studies Reviewed: Additional studies/ records that were reviewed today include: TTE 07/17/18  Review of the above records today  demonstrates:   1. The left ventricle has normal systolic function, with an ejection fraction of 55-60%. The cavity size was mildly dilated. There is moderately increased left ventricular wall thickness. Left ventricular diastolic Doppler parameters are consistent with  pseudonormalization.  2. The right ventricle has normal systolic function. The cavity was normal. There is no increase in right ventricular wall thickness.  3. Left atrial size was severely dilated.  4. Right atrial size was moderately dilated.  5. There is mild mitral annular calcification present.  6. The ascending aorta is normal in size and structure.  7. There is mild dilatation of the aortic root.  8. The interatrial septum was not assessed.   ASSESSMENT AND PLAN:  1.  Longstanding persistent atrial fibrillation: Currently quite symptomatic.  Is apparently had an ablation in the past, and is now on dofetilide.  Fortunately he is in sinus rhythm.  Unfortunately his left atrium is severely dilated.  It is certainly possible that rate control will be difficult.  I have told him to try and abstain from alcohol and.  I will also reorder a sleep study.  He does have sleep apnea, but it is poorly controlled.  We will also give him the numbers for bariatric surgery.  I do think that weight loss will greatly improve her chances of keeping him in sinus  rhythm.  2.  Morbid obesity: We will refer for bariatric surgery  3.  Hypertension: Currently well controlled  4.  Obstructive sleep apnea: We will reorder a sleep study.  I have encouraged him to be compliant with his CPAP as it is unlikely that we will be able to keep him in sinus rhythm if his sleep apnea is uncontrolled.  Current medicines are reviewed at length with the patient today.   The patient does not have concerns regarding his medicines.  The following changes were made today:  none  Labs/ tests ordered today include:  Orders Placed This Encounter  Procedures  . EKG 12-Lead  . Split night study   Case discussed with referring physician  Disposition:   FU with Will Camnitz 6 months  Signed, Will Jorja LoaMartin Camnitz, MD  01/06/2019 2:39 PM     [facility] [address] [place] [address] [phone] (office) [phone] (fax)

## 2019-01-06 NOTE — Patient Instructions (Addendum)
Medication Instructions:  No medication changes today.   *If you need a refill on your cardiac medications before your next appointment, please call your pharmacy*  Lab Work: No lab work today.  If you have labs (blood work) drawn today and your tests are completely normal, you will receive your results only by: Marland Kitchen MyChart Message (if you have MyChart) OR . A paper copy in the mail If you have any lab test that is abnormal or we need to change your treatment, we will call you to review the results.  Testing/Procedures: None today.  Follow-Up: At [facility], you and your health needs are our priority.  As part of our continuing mission to provide you with exceptional heart care, we have created designated Provider Care Teams.  These Care Teams include your primary Cardiologist (physician) and Advanced Practice Providers (APPs -  Physician Assistants and Nurse Practitioners) who all work together to provide you with the care you need, when you need it.  Your next appointment:   3 months  The format for your next appointment:   In Person  Provider:   Shirlee More, MD  Other Instructions  Would take Lasix daily until your weight returns to 285 lbs. Once your weight is 285 lbs you can return to taking the Lasix on an as-needed basis.   Would recommend avoiding alcohol as it can increase the blood thinning effect of your Xarelto. Please be mindful of signs of bleeding such as blood in urine, blood in stool.

## 2019-01-13 ENCOUNTER — Other Ambulatory Visit: Payer: Self-pay | Admitting: Cardiology

## 2019-01-13 ENCOUNTER — Other Ambulatory Visit: Payer: Self-pay | Admitting: Internal Medicine

## 2019-01-27 ENCOUNTER — Telehealth: Payer: Self-pay | Admitting: *Deleted

## 2019-01-27 NOTE — Telephone Encounter (Signed)
Staff message sent to Gae Bon ok to schedule sleep study. No PA is required. Call reference [phone].

## 2019-02-10 ENCOUNTER — Telehealth: Payer: Self-pay | Admitting: *Deleted

## 2019-02-10 NOTE — Telephone Encounter (Signed)
-----   Message from Lauralee Evener, [place] sent at 01/27/2019 10:08 AM EST ----- Regarding: RE: Sleep study Per [employer] no PA is required. Ok to schedule sleep study. Call reference [phone]. ----- Message ----- From: Stanton Kidney, RN Sent: 01/06/2019   3:11 PM EST To: Stanton Kidney, RN, [facility] Sleep Studies Subject: Sleep study                                    Please arrange sleep study  (pt has had one previously, but it has been awhile and he has gained weight.  He also had CPAP at home but doesn't use it but it going to try and start using again. Not sure if he needs another sleep study or simply titration.  thx

## 2019-02-10 NOTE — Telephone Encounter (Signed)
Patient is scheduled for lab study on 02/18/19. Jesus Barnes is scheduled for COVID screening on 02/15/19 prior to his sleep study.  Patient understands his sleep study will be done at [facility] sleep lab. Patient understands he will receive a sleep packet in a week or so. Patient understands to call if he does not receive the sleep packet in a timely manner.  Patient has declined to have his sleep study until after January 20221.

## 2019-02-12 ENCOUNTER — Other Ambulatory Visit: Payer: Self-pay | Admitting: Cardiology

## 2019-02-13 NOTE — Telephone Encounter (Signed)
Patient schedule changed to sleep study 03/23/18. Covid test 03/20/18 2 pm.

## 2019-02-15 ENCOUNTER — Other Ambulatory Visit (HOSPITAL_COMMUNITY): Payer: 59

## 2019-02-18 ENCOUNTER — Encounter (HOSPITAL_BASED_OUTPATIENT_CLINIC_OR_DEPARTMENT_OTHER): Payer: 59 | Admitting: Cardiology

## 2019-03-21 ENCOUNTER — Other Ambulatory Visit (HOSPITAL_COMMUNITY): Payer: 59

## 2019-03-24 ENCOUNTER — Encounter (HOSPITAL_BASED_OUTPATIENT_CLINIC_OR_DEPARTMENT_OTHER): Payer: 59 | Admitting: Cardiology

## 2019-05-31 ENCOUNTER — Other Ambulatory Visit: Payer: Self-pay | Admitting: Internal Medicine

## 2019-07-01 ENCOUNTER — Other Ambulatory Visit: Payer: Self-pay | Admitting: Cardiology

## 2019-07-01 DIAGNOSIS — I4811 Longstanding persistent atrial fibrillation: Secondary | ICD-10-CM

## 2019-07-01 NOTE — Telephone Encounter (Signed)
LOV with Dr. Dulce Sellar 12/31/2018 and LOV with Dr. Elberta Fortis 01/06/2019

## 2019-08-26 ENCOUNTER — Telehealth: Payer: Self-pay

## 2019-08-26 MED ORDER — METOPROLOL TARTRATE 50 MG PO TABS: 75.0000 mg | ORAL_TABLET | Freq: Two times a day (BID) | ORAL | 3 refills | Status: DC

## 2019-08-26 NOTE — Telephone Encounter (Signed)
Refill sent in per faxed request 

## 2019-08-27 ENCOUNTER — Telehealth: Payer: Self-pay

## 2019-08-27 MED ORDER — METOPROLOL TARTRATE 50 MG PO TABS: 75.0000 mg | ORAL_TABLET | Freq: Two times a day (BID) | ORAL | 3 refills | Status: DC

## 2019-08-27 NOTE — Telephone Encounter (Signed)
Refill sent in per faxed request 

## 2019-08-28 ENCOUNTER — Telehealth: Payer: Self-pay

## 2019-08-28 MED ORDER — METOPROLOL TARTRATE 50 MG PO TABS: 75.0000 mg | ORAL_TABLET | Freq: Two times a day (BID) | ORAL | 0 refills | Status: DC

## 2019-08-28 NOTE — Telephone Encounter (Signed)
Refill sent in for Lopressor to [facility]

## 2019-10-23 ENCOUNTER — Telehealth (HOSPITAL_COMMUNITY): Payer: Self-pay

## 2019-10-23 NOTE — Telephone Encounter (Signed)
[facility] [employer] plan approve Dofetilide 10/22/2019-10/21/2020.

## 2019-10-24 ENCOUNTER — Other Ambulatory Visit: Payer: Self-pay | Admitting: Cardiology

## 2019-10-28 MED ORDER — METOPROLOL TARTRATE 50 MG PO TABS: 75.0000 mg | ORAL_TABLET | Freq: Two times a day (BID) | ORAL | 0 refills | Status: DC

## 2019-10-28 NOTE — Addendum Note (Signed)
Addended by: Delorse Limber I on: 10/28/2019 04:06 PM   Modules accepted: Orders

## 2019-10-28 NOTE — Telephone Encounter (Signed)
°*  STAT* If patient is at the pharmacy, call can be transferred to refill team.   1. Which medications need to be refilled? (please list name of each medication and dose if known) metoprolol tartrate (LOPRESSOR) 50 MG tablet  2. Which pharmacy/location (including street and city if local pharmacy) is medication to be sent to? [facility] - [place], [place] - [address]  3. Do they need a 30 day or 90 day supply? 90 day

## 2019-10-28 NOTE — Telephone Encounter (Signed)
Refill sent in per request.  

## 2019-11-05 ENCOUNTER — Other Ambulatory Visit: Payer: Self-pay | Admitting: Cardiology

## 2019-11-25 ENCOUNTER — Other Ambulatory Visit: Payer: Self-pay | Admitting: Cardiology

## 2019-12-11 ENCOUNTER — Other Ambulatory Visit: Payer: Self-pay | Admitting: Cardiology

## 2019-12-23 ENCOUNTER — Telehealth: Payer: Self-pay | Admitting: Cardiology

## 2019-12-23 NOTE — Telephone Encounter (Signed)
Spoke to the patient just now and let him know that we do recommend that he gets the COVID vaccine. He then stated "Well I guess that I will just have to quit my job then" and he hung up at this time.

## 2019-12-23 NOTE — Telephone Encounter (Signed)
We are recommending the COVID-19 vaccine to all of our patients. Cardiac medications (including blood thinners) should not deter anyone from being vaccinated and there is no need to hold any of those medications prior to vaccine administration.     Currently, there is a hotline to call (active 03/21/19) to schedule vaccination appointments as no walk-ins will be accepted.   Number: [phone].    If an appointment is not available please go to [email or web address] to sign up for notification when additional vaccine appointments are available.   If you have further questions or concerns about the vaccine process, please visit [email or web address] or contact your primary care physician.   Patient would still like to speak with a nurse

## 2019-12-23 NOTE — Telephone Encounter (Signed)
Left message for patient to return call.

## 2020-01-02 ENCOUNTER — Other Ambulatory Visit: Payer: Self-pay | Admitting: Family

## 2020-01-02 ENCOUNTER — Other Ambulatory Visit: Payer: Self-pay | Admitting: Cardiology

## 2020-01-02 DIAGNOSIS — I4811 Longstanding persistent atrial fibrillation: Secondary | ICD-10-CM

## 2020-01-02 NOTE — Telephone Encounter (Signed)
Prescription refill request for Xarelto received.  Indication: A fib Last office visit: 01/06/19 Weight: 178 kg Age: 49 Scr: 1.21 CrCl: 173mL/min  Will send 30 day refill.  Patient needs appointment

## 2020-01-06 ENCOUNTER — Other Ambulatory Visit: Payer: Self-pay | Admitting: Cardiology

## 2020-02-02 ENCOUNTER — Other Ambulatory Visit: Payer: Self-pay | Admitting: Cardiology

## 2020-02-02 DIAGNOSIS — I4811 Longstanding persistent atrial fibrillation: Secondary | ICD-10-CM

## 2020-02-08 ENCOUNTER — Other Ambulatory Visit: Payer: Self-pay | Admitting: Cardiology

## 2020-02-17 ENCOUNTER — Other Ambulatory Visit: Payer: Self-pay | Admitting: Cardiology

## 2020-03-01 ENCOUNTER — Other Ambulatory Visit: Payer: Self-pay | Admitting: Cardiology

## 2020-03-01 NOTE — Telephone Encounter (Signed)
Rx refill sent to pharmacy. 

## 2020-03-26 ENCOUNTER — Other Ambulatory Visit: Payer: Self-pay | Admitting: Cardiology

## 2020-04-09 DIAGNOSIS — T300 Burn of unspecified body region, unspecified degree: Secondary | ICD-10-CM | POA: Insufficient documentation

## 2020-04-09 DIAGNOSIS — R1310 Dysphagia, unspecified: Secondary | ICD-10-CM | POA: Insufficient documentation

## 2020-04-09 DIAGNOSIS — I10 Essential (primary) hypertension: Secondary | ICD-10-CM | POA: Insufficient documentation

## 2020-04-09 DIAGNOSIS — G935 Compression of brain: Secondary | ICD-10-CM | POA: Insufficient documentation

## 2020-04-09 DIAGNOSIS — R002 Palpitations: Secondary | ICD-10-CM | POA: Insufficient documentation

## 2020-04-09 DIAGNOSIS — K219 Gastro-esophageal reflux disease without esophagitis: Secondary | ICD-10-CM | POA: Insufficient documentation

## 2020-04-09 DIAGNOSIS — G4733 Obstructive sleep apnea (adult) (pediatric): Secondary | ICD-10-CM | POA: Insufficient documentation

## 2020-04-17 ENCOUNTER — Other Ambulatory Visit: Payer: Self-pay | Admitting: Cardiology

## 2020-04-18 ENCOUNTER — Other Ambulatory Visit: Payer: Self-pay | Admitting: Cardiology

## 2020-04-25 NOTE — H&P (View-Only) (Signed)
Cardiology Office Note:    Date:  04/26/2020   ID:  Jesus Barnes, DOB 05/24/1970, MRN [id number]  PCP:  Hudnell, Stephanie, NP  Cardiologist:  Yoandri Congrove, MD    Referring MD: Hudnell, Stephanie, NP    ASSESSMENT:    1. Paroxysmal atrial fibrillation (HCC)   2. Chronic anticoagulation   3. High risk medication use   4. Hypertensive heart disease with chronic diastolic congestive heart failure (HCC)   5. Mild CAD    PLAN:    In order of problems listed above:  1. Recurrent atrial fibrillation associated with noncompliance with dofetilide he is quite symptomatic he has been anticoagulated I will set him up for outpatient cardioversion and also discuss with Dr. Camnitz.  I have stopped his dofetilide and will continue his beta-blocker and as needed diltiazem and anticoagulant uninterrupted. 2. Continue anticoagulant uninterrupted 3. His corrected QT interval is prolonged greater than 500 ms I Minna stop his dofetilide for the time being pending cardioversion EP consultation   Next appointment: 2 weeks   Medication Adjustments/Labs and Tests Ordered: Current medicines are reviewed at length with the patient today.  Concerns regarding medicines are outlined above.  Orders Placed This Encounter  Procedures  . Basic metabolic panel  . Pro b natriuretic peptide (BNP)  . CBC  . Protime-INR  . EKG 12-Lead   No orders of the defined types were placed in this encounter.   Chief Complaint  Patient presents with  . Follow-up  . Atrial Fibrillation    Recently started on dofetilide    History of Present Illness:    Jesus Barnes is a 49 y.o. male with a hx of atrial fibrillation despite antiarrhythmic drug therapy and previous EP catheter ablation, hypertension with chronic diastolic heart failure chronic anticoagulation and mild CAD last seen 12/31/2018.  He was initiated on dofetilide and converted spontaneously to sinus rhythm.  His echocardiogram performed May 2020  showed EF of 55 to 60% mild left ventricular dilation and moderate concentric LVH, the left atrium is severely dilated and the right atrium is moderately dilated.  Last creatinine a year ago 1.02 GFR 85 cc she showed sinus rhythm at that time QRS duration 98 ms. Compliance with diet, lifestyle and medications: No  He has been off dofetilide and has been in atrial fibrillation now for about 2 weeks.  Typically he feels very badly he gets nonanginal chest pain shortness of breath weakness he is trying to wait since he restarted his dofetilide on 7 February and continues in atrial fibrillation.  When seen by me today his heart rate is 118 bpm.  He has not missed his anticoagulant.  I am going to set him up for cardioversion.  His QRS duration is 102 ms not significantly different than his baseline EKG and will recheck labs on him today.  No edema orthopnea or syncope.  Records reviewed for this visit left heart cath [facility] 04/06/2016 mild nonobstructive CAD EF 35 to 40%.  I questioned him a second time with his wife in the room and both of them are adamant that he has missed no doses of his anticoagulant for greater than 1 month. His corrected QT interval is 546 ms will go ahead and stop flecainide pending cardioversion Past Medical History:  Diagnosis Date  . Anxiety 09/02/2014  . Burn   . Chronic anticoagulation 10/16/2016  . Dilated cardiomyopathy (HCC) 10/16/2016   EF 30-35%, 05/15/16 40%, 07/17/18 55-60%  . Dysphagia   .   Esophageal stricture 09/02/2014  . Essential hypertension 09/01/2014  . GERD (gastroesophageal reflux disease)   . 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HCC)   . Hypertension   . Hypertension, essential 08/08/2018  . Hypertensive heart disease 09/01/2014  . Mild CAD 10/16/2016  . Morbid obesity with body mass index (BMI) of 45.0 to 49.9 in adult [facility]) 04/05/2016  . Obstructive sleep apnea    awaiting treatment  . On amiodarone therapy 10/16/2016  . Palpitation   . Paroxysmal atrial  fibrillation (HCC) 12/12/2016  . Persistent atrial fibrillation (HCC) 09/01/2014   Overview:  CHADS2=1    Past Surgical History:  Procedure Laterality Date  . APPENDECTOMY    . CARDIOVERSION    . CARDIOVERSION N/A 12/14/2016   Procedure: CARDIOVERSION;  Surgeon: Marinus Maw, MD;  Location: [facility] IN[facility];  Service: Cardiovascular;  Laterality: N/A;  . CARDIOVERSION N/A 08/15/2018   Procedure: CARDIOVERSION;  Surgeon: Thurmon Fair, MD;  [facility];  Service: Cardiovascular;  Laterality: N/A;  . COLONOSCOPY  06/18/2001    Bx: Tubulovillous adenoma. Done by Dr. Charm Barges. He was told to come back 19yr later, but pt didnot.   . ESOPHAGOGASTRODUODENOSCOPY  12/17/2014   mild gastritis. Small antral polyps (likely inflammatory). Status post empiric esophageal dilatation  . HERNIA REPAIR     umblical  . KNEE SURGERY    . SKIN GRAFT      Current Medications: Current Meds  Medication Sig  . atorvastatin (LIPITOR) 10 MG tablet Take 10 mg by mouth daily.  Marland Kitchen buPROPion (WELLBUTRIN XL) 300 MG 24 hr tablet Take 300 mg by mouth daily.   Marland Kitchen dofetilide (TIKOSYN) 500 MCG capsule TAKE 1 CAPSULE BY MOUTH TWICE A DAY  . furosemide (LASIX) 40 MG tablet TAKE ONE TABLET BY MOUTH EVERY DAY (Patient taking differently: Take 40 mg by mouth daily as needed.)  . lisinopril (ZESTRIL) 10 MG tablet Take 10 mg by mouth daily.   . metoprolol tartrate (LOPRESSOR) 50 MG tablet TAKE 1&1/2 TABLETS BY MOUTH 2 TIMES DAILY. PLEASE SCHEDULE FOLLOW UP APPOINTMENT FOR FURTHER REFILLS  . pantoprazole (PROTONIX) 40 MG tablet Take 1 tablet (40 mg total) by mouth 2 (two) times daily.  Carlena Hurl 20 MG TABS tablet TAKE 1 TABLET BY MOUTH ONCE DAILY WITH SUPPER     Allergies:   Patient has no known allergies.   Social History   Socioeconomic History  . Marital status: Married    Spouse name: Not on file  . Number of children: 1  . Years of education: Not on file  . Highest education level: Not on file   Occupational History  . Occupation: Truck [employer] doctor  Tobacco Use  . Smoking status: Former Smoker    Types: Cigars  . Smokeless tobacco: Never Used  . Tobacco comment: quit 7 years ago  Vaping Use  . Vaping Use: Never used  Substance and Sexual Activity  . Alcohol use: Yes    Comment: ocassionally  . Drug use: No  . Sexual activity: Not on file  Other Topics Concern  . 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 There is no history of Colon cancer or Esophageal cancer. ROS:   Please see the history of present illness.    All other systems reviewed and are negative.  EKGs/Labs/Other Studies Reviewed:    The following studies were reviewed today:  EKG:  EKG ordered today and personally reviewed.  The ekg ordered today demonstrates atrial fibrillation rate 118 bpm with inferior MI  Recent Labs: No results found for requested labs within last 8760 hours.  Recent Lipid Panel No results found for: CHOL, TRIG, HDL, CHOLHDL, VLDL, LDLCALC, LDLDIRECT  Physical Exam:    VS:  BP 114/88   Pulse (!) 118   Ht 6' 2.5" (1.892 m)   Wt (!) 381 lb 3.2 oz (172.9 kg)   SpO2 96%   BMI 48.29 kg/m     Wt Readings from Last 3 Encounters:  04/26/20 (!) 381 lb 3.2 oz (172.9 kg)  01/06/19 (!) 392 lb 6.4 oz (178 kg)  12/31/18 (!) 385 lb 1.9 oz (174.7 kg)     GEN: Marked obesity BMI 48% well nourished, well developed in no acute distress HEENT: Normal NECK: No JVD; No carotid bruits LYMPHATICS: No lymphadenopathy CARDIAC: Rapid irregular heart rhythm  no murmurs, rubs, gallops RESPIRATORY:   Clear to auscultation without rales, wheezing or rhonchi  ABDOMEN: Soft, non-tender, non-distended MUSCULOSKELETAL:  No edema; No deformity  SKIN: Warm and dry NEUROLOGIC:  Alert and oriented x 3 PSYCHIATRIC:  Normal affect    Signed, Norman Herrlich, MD  04/26/2020 4:51 PM    [facility]

## 2020-04-25 NOTE — Progress Notes (Unsigned)
Cardiology Office Note:    Date:  04/26/2020   ID:  Jesus Barnes, DOB 09/14/1970, MRN [id number]  PCP:  Dulce Sellar, NP  Cardiologist:  Norman Herrlich, MD    Referring MD: Dulce Sellar, NP    ASSESSMENT:    1. Paroxysmal atrial fibrillation (HCC)   2. Chronic anticoagulation   3. High risk medication use   4. Hypertensive heart disease with chronic diastolic congestive heart failure (HCC)   5. Mild CAD    PLAN:    In order of problems listed above:  1. Recurrent atrial fibrillation associated with noncompliance with dofetilide he is quite symptomatic he has been anticoagulated I will set him up for outpatient cardioversion and also discuss with Dr. Elberta Fortis.  I have stopped his dofetilide and will continue his beta-blocker and as needed diltiazem and anticoagulant uninterrupted. 2. Continue anticoagulant uninterrupted 3. His corrected QT interval is prolonged greater than 500 ms I Minna stop his dofetilide for the time being pending cardioversion EP consultation   Next appointment: 2 weeks   Medication Adjustments/Labs and Tests Ordered: Current medicines are reviewed at length with the patient today.  Concerns regarding medicines are outlined above.  Orders Placed This Encounter  Procedures  . Basic metabolic panel  . Pro b natriuretic peptide (BNP)  . CBC  . Protime-INR  . EKG 12-Lead   No orders of the defined types were placed in this encounter.   Chief Complaint  Patient presents with  . Follow-up  . Atrial Fibrillation    Recently started on dofetilide    History of Present Illness:    Jesus Barnes is a 50 y.o. male with a hx of atrial fibrillation despite antiarrhythmic drug therapy and previous EP catheter ablation, hypertension with chronic diastolic heart failure chronic anticoagulation and mild CAD last seen 12/31/2018.  He was initiated on dofetilide and converted spontaneously to sinus rhythm.  His echocardiogram performed May 2020  showed EF of 55 to 60% mild left ventricular dilation and moderate concentric LVH, the left atrium is severely dilated and the right atrium is moderately dilated.  Last creatinine a year ago 1.02 GFR 85 cc she showed sinus rhythm at that time QRS duration 98 ms. Compliance with diet, lifestyle and medications: No  He has been off dofetilide and has been in atrial fibrillation now for about 2 weeks.  Typically he feels very badly he gets nonanginal chest pain shortness of breath weakness he is trying to wait since he restarted his dofetilide on 7 February and continues in atrial fibrillation.  When seen by me today his heart rate is 118 bpm.  He has not missed his anticoagulant.  I am going to set him up for cardioversion.  His QRS duration is 102 ms not significantly different than his baseline EKG and will recheck labs on him today.  No edema orthopnea or syncope.  Records reviewed for this visit left heart cath [facility] 04/06/2016 mild nonobstructive CAD EF 35 to 40%.  I questioned him a second time with his wife in the room and both of them are adamant that he has missed no doses of his anticoagulant for greater than 1 month. His corrected QT interval is 546 ms will go ahead and stop flecainide pending cardioversion Past Medical History:  Diagnosis Date  . Anxiety 09/02/2014  . Burn   . Chronic anticoagulation 10/16/2016  . Dilated cardiomyopathy (HCC) 10/16/2016   EF 30-35%, 05/15/16 40%, 07/17/18 55-60%  . Dysphagia   .  Esophageal stricture 09/02/2014  . Essential hypertension 09/01/2014  . GERD (gastroesophageal reflux disease)   . 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HCC)   . Hypertension   . Hypertension, essential 08/08/2018  . Hypertensive heart disease 09/01/2014  . Mild CAD 10/16/2016  . Morbid obesity with body mass index (BMI) of 45.0 to 49.9 in adult [facility]) 04/05/2016  . Obstructive sleep apnea    awaiting treatment  . On amiodarone therapy 10/16/2016  . Palpitation   . Paroxysmal atrial  fibrillation (HCC) 12/12/2016  . Persistent atrial fibrillation (HCC) 09/01/2014   Overview:  CHADS2=1    Past Surgical History:  Procedure Laterality Date  . APPENDECTOMY    . CARDIOVERSION    . CARDIOVERSION N/A 12/14/2016   Procedure: CARDIOVERSION;  Surgeon: Marinus Maw, MD;  Location: [facility] INVASIVE CV LAB;  Service: Cardiovascular;  Laterality: N/A;  . CARDIOVERSION N/A 08/15/2018   Procedure: CARDIOVERSION;  Surgeon: Thurmon Fair, MD;  Location: [facility] ENDOSCOPY;  Service: Cardiovascular;  Laterality: N/A;  . COLONOSCOPY  06/18/2001    Bx: Tubulovillous adenoma. Done by Dr. Charm Barges. He was told to come back 19yr later, but pt didnot.   . ESOPHAGOGASTRODUODENOSCOPY  12/17/2014   mild gastritis. Small antral polyps (likely inflammatory). Status post empiric esophageal dilatation  . HERNIA REPAIR     umblical  . KNEE SURGERY    . SKIN GRAFT      Current Medications: Current Meds  Medication Sig  . atorvastatin (LIPITOR) 10 MG tablet Take 10 mg by mouth daily.  Marland Kitchen buPROPion (WELLBUTRIN XL) 300 MG 24 hr tablet Take 300 mg by mouth daily.   Marland Kitchen dofetilide (TIKOSYN) 500 MCG capsule TAKE 1 CAPSULE BY MOUTH TWICE A DAY  . furosemide (LASIX) 40 MG tablet TAKE ONE TABLET BY MOUTH EVERY DAY (Patient taking differently: Take 40 mg by mouth daily as needed.)  . lisinopril (ZESTRIL) 10 MG tablet Take 10 mg by mouth daily.   . metoprolol tartrate (LOPRESSOR) 50 MG tablet TAKE 1&1/2 TABLETS BY MOUTH 2 TIMES DAILY. PLEASE SCHEDULE FOLLOW UP APPOINTMENT FOR FURTHER REFILLS  . pantoprazole (PROTONIX) 40 MG tablet Take 1 tablet (40 mg total) by mouth 2 (two) times daily.  Carlena Hurl 20 MG TABS tablet TAKE 1 TABLET BY MOUTH ONCE DAILY WITH SUPPER     Allergies:   Patient has no known allergies.   Social History   Socioeconomic History  . Marital status: Married    Spouse name: Not on file  . Number of children: 1  . Years of education: Not on file  . Highest education level: Not on file   Occupational History  . Occupation: Truck [employer] doctor  Tobacco Use  . Smoking status: Former Smoker    Types: Cigars  . Smokeless tobacco: Never Used  . Tobacco comment: quit 7 years ago  Vaping Use  . Vaping Use: Never used  Substance and Sexual Activity  . Alcohol use: Yes    Comment: ocassionally  . Drug use: No  . Sexual activity: Not on file  Other Topics Concern  . 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 There is no history of Colon cancer or Esophageal cancer. ROS:   Please see the history of present illness.    All other systems reviewed and are negative.  EKGs/Labs/Other Studies Reviewed:    The following studies were reviewed today:  EKG:  EKG ordered today and personally reviewed.  The ekg ordered today demonstrates atrial fibrillation rate 118 bpm with inferior MI  Recent Labs: No results found for requested labs within last 8760 hours.  Recent Lipid Panel No results found for: CHOL, TRIG, HDL, CHOLHDL, VLDL, LDLCALC, LDLDIRECT  Physical Exam:    VS:  BP 114/88   Pulse (!) 118   Ht 6' 2.5" (1.892 m)   Wt (!) 381 lb 3.2 oz (172.9 kg)   SpO2 96%   BMI 48.29 kg/m     Wt Readings from Last 3 Encounters:  04/26/20 (!) 381 lb 3.2 oz (172.9 kg)  01/06/19 (!) 392 lb 6.4 oz (178 kg)  12/31/18 (!) 385 lb 1.9 oz (174.7 kg)     GEN: Marked obesity BMI 48% well nourished, well developed in no acute distress HEENT: Normal NECK: No JVD; No carotid bruits LYMPHATICS: No lymphadenopathy CARDIAC: Rapid irregular heart rhythm  no murmurs, rubs, gallops RESPIRATORY:   Clear to auscultation without rales, wheezing or rhonchi  ABDOMEN: Soft, non-tender, non-distended MUSCULOSKELETAL:  No edema; No deformity  SKIN: Warm and dry NEUROLOGIC:  Alert and oriented x 3 PSYCHIATRIC:  Normal affect    Signed, Norman Herrlich, MD  04/26/2020 4:51 PM    [facility]

## 2020-04-26 ENCOUNTER — Encounter: Payer: Self-pay | Admitting: Cardiology

## 2020-04-26 ENCOUNTER — Other Ambulatory Visit: Payer: Self-pay

## 2020-04-26 ENCOUNTER — Ambulatory Visit (INDEPENDENT_AMBULATORY_CARE_PROVIDER_SITE_OTHER): Payer: 59 | Admitting: Cardiology

## 2020-04-26 VITALS — BP 114/88 | HR 118 | Ht 74.5 in | Wt 381.2 lb

## 2020-04-26 DIAGNOSIS — I480 Paroxysmal atrial fibrillation: Secondary | ICD-10-CM | POA: Diagnosis not present

## 2020-04-26 DIAGNOSIS — Z7901 Long term (current) use of anticoagulants: Secondary | ICD-10-CM

## 2020-04-26 DIAGNOSIS — Z79899 Other long term (current) drug therapy: Secondary | ICD-10-CM

## 2020-04-26 DIAGNOSIS — I110 Hypertensive heart disease with heart failure: Secondary | ICD-10-CM | POA: Diagnosis not present

## 2020-04-26 DIAGNOSIS — I5032 Chronic diastolic (congestive) heart failure: Secondary | ICD-10-CM

## 2020-04-26 DIAGNOSIS — I2510 Atherosclerotic heart disease of native coronary artery without angina pectoris: Secondary | ICD-10-CM

## 2020-04-26 MED ORDER — DILTIAZEM HCL 30 MG PO TABS: ORAL_TABLET | ORAL | 1 refills | Status: DC

## 2020-04-26 NOTE — Patient Instructions (Addendum)
Medication Instructions:  Your physician has recommended you make the following change in your medication:  STOP: Tikosyn RESTART: Diltiazem as needed *If you need a refill on your cardiac medications before your next appointment, please call your pharmacy*   Lab Work: Your physician recommends that you return for lab work in: TODAY CBC, BMP, ProBNP, PT/INR If you have labs (blood work) drawn today and your tests are completely normal, you will receive your results only by: Marland Kitchen MyChart Message (if you have MyChart) OR . A paper copy in the mail If you have any lab test that is abnormal or we need to change your treatment, we will call you to review the results.   Testing/Procedures: You are scheduled for a Cardioversion on Friday the 18th with Dr. Royann Shivers.  Please arrive at the [facility] (Main Entrance A) at [facility] [address] [place], [place] [address] at 7:30 am. (1 hour prior to procedure unless lab work is needed; if lab work is needed arrive 1.5 hours ahead)  DIET: Nothing to eat or drink after midnight except a sip of water with medications (see medication instructions below)  Medication Instructions: Hold Furosemide the morning of the procedure  Continue your anticoagulant: Xarelto You will need to continue your anticoagulant after your procedure until you are told by your provider that it is safe to stop.   Labs: YOU HAD YOUR LABS DRAWN TODAY 04/26/2020  You must have a responsible person to drive you home and stay in the waiting area during your procedure. Failure to do so could result in cancellation.  Bring your insurance cards.  *Special Note: Every effort is made to have your procedure done on time. Occasionally there are emergencies that occur at the hospital that may cause delays. Please be patient if a delay does occur.     Follow-Up: At [facility], you and your health needs are our priority.  As part of our continuing mission to provide  you with exceptional heart care, we have created designated Provider Care Teams.  These Care Teams include your primary Cardiologist (physician) and Advanced Practice Providers (APPs -  Physician Assistants and Nurse Practitioners) who all work together to provide you with the care you need, when you need it.  We recommend signing up for the patient portal called "MyChart".  Sign up information is provided on this After Visit Summary.  MyChart is used to connect with patients for Virtual Visits (Telemedicine).  Patients are able to view lab/test results, encounter notes, upcoming appointments, etc.  Non-urgent messages can be sent to your provider as well.   To learn more about what you can do with MyChart, go to [email or web address].    Your next appointment:   2 week(s)  The format for your next appointment:   In Person  Provider:   Norman Herrlich, MD   Other Instructions

## 2020-04-27 ENCOUNTER — Telehealth: Payer: Self-pay

## 2020-04-27 LAB — CBC
Hematocrit: 43.6 % (ref 37.5–51.0)
Hemoglobin: 14.7 g/dL (ref 13.0–17.7)
MCH: 29.2 pg (ref 26.6–33.0)
MCHC: 33.7 g/dL (ref 31.5–35.7)
MCV: 87 fL (ref 79–97)
Platelets: 272 10*3/uL (ref 150–450)
RBC: 5.04 x10E6/uL (ref 4.14–5.80)
RDW: 13.3 % (ref 11.6–15.4)
WBC: 6 10*3/uL (ref 3.4–10.8)

## 2020-04-27 LAB — BASIC METABOLIC PANEL
BUN/Creatinine Ratio: 20 (ref 9–20)
BUN: 19 mg/dL (ref 6–24)
CO2: 20 mmol/L (ref 20–29)
Calcium: 8.9 mg/dL (ref 8.7–10.2)
Chloride: 105 mmol/L (ref 96–106)
Creatinine, Ser: 0.93 mg/dL (ref 0.76–1.27)
GFR calc Af Amer: 109 mL/min/{1.73_m2} (ref >59)
GFR calc non Af Amer: 95 mL/min/{1.73_m2} (ref >59)
Glucose: 95 mg/dL (ref 65–99)
Potassium: 4.1 mmol/L (ref 3.5–5.2)
Sodium: 141 mmol/L (ref 134–144)

## 2020-04-27 LAB — PRO B NATRIURETIC PEPTIDE: NT-Pro BNP: 1421 pg/mL — ABNORMAL HIGH (ref 0–121)

## 2020-04-27 NOTE — Telephone Encounter (Signed)
Spoke with patient regarding results and recommendation.  Patient verbalizes understanding and is agreeable to plan of care. Advised patient to call back with any issues or concerns.  

## 2020-04-27 NOTE — Telephone Encounter (Signed)
-----   Message from Baldo Daub, MD sent at 04/27/2020 10:13 AM EST ----- Good results for cardioversion Friday  Dr. Elberta Fortis advises not to restart dofetilide and wants to see him regarding EP catheter ablation.

## 2020-04-28 ENCOUNTER — Other Ambulatory Visit (HOSPITAL_COMMUNITY)
Admission: RE | Admit: 2020-04-28 | Discharge: 2020-04-28 | Disposition: A | Discharge status: Home / Self Care | Payer: 59 | Source: Ambulatory Visit | Attending: Cardiovascular Disease | Admitting: Cardiovascular Disease

## 2020-04-28 DIAGNOSIS — Z20822 Contact with and (suspected) exposure to covid-19: Secondary | ICD-10-CM | POA: Insufficient documentation

## 2020-04-28 DIAGNOSIS — Z01812 Encounter for preprocedural laboratory examination: Secondary | ICD-10-CM | POA: Diagnosis present

## 2020-04-28 LAB — SARS CORONAVIRUS 2 (TAT 6-24 HRS): SARS Coronavirus 2: NEGATIVE

## 2020-04-30 ENCOUNTER — Encounter (HOSPITAL_COMMUNITY)
Admission: RE | Disposition: A | Discharge status: Home / Self Care | Payer: Self-pay | Source: Home / Self Care | Attending: Cardiovascular Disease

## 2020-04-30 ENCOUNTER — Ambulatory Visit (HOSPITAL_COMMUNITY): Payer: 59 | Admitting: Certified Registered"

## 2020-04-30 ENCOUNTER — Encounter (HOSPITAL_COMMUNITY): Payer: Self-pay | Admitting: Cardiovascular Disease

## 2020-04-30 ENCOUNTER — Other Ambulatory Visit: Payer: Self-pay

## 2020-04-30 ENCOUNTER — Ambulatory Visit (HOSPITAL_COMMUNITY)
Admission: RE | Admit: 2020-04-30 | Discharge: 2020-04-30 | Disposition: A | Discharge status: Home / Self Care | Payer: 59 | Attending: Cardiovascular Disease | Admitting: Cardiovascular Disease

## 2020-04-30 DIAGNOSIS — I5032 Chronic diastolic (congestive) heart failure: Secondary | ICD-10-CM | POA: Insufficient documentation

## 2020-04-30 DIAGNOSIS — I480 Paroxysmal atrial fibrillation: Secondary | ICD-10-CM | POA: Insufficient documentation

## 2020-04-30 DIAGNOSIS — Z87891 Personal history of nicotine dependence: Secondary | ICD-10-CM | POA: Insufficient documentation

## 2020-04-30 DIAGNOSIS — Z7901 Long term (current) use of anticoagulants: Secondary | ICD-10-CM | POA: Diagnosis not present

## 2020-04-30 DIAGNOSIS — I4819 Other persistent atrial fibrillation: Secondary | ICD-10-CM

## 2020-04-30 DIAGNOSIS — Z79899 Other long term (current) drug therapy: Secondary | ICD-10-CM | POA: Diagnosis not present

## 2020-04-30 DIAGNOSIS — I2510 Atherosclerotic heart disease of native coronary artery without angina pectoris: Secondary | ICD-10-CM | POA: Insufficient documentation

## 2020-04-30 DIAGNOSIS — I110 Hypertensive heart disease with heart failure: Secondary | ICD-10-CM | POA: Insufficient documentation

## 2020-04-30 HISTORY — PX: CARDIOVERSION: SHX1299

## 2020-04-30 SURGERY — CARDIOVERSION
Anesthesia: General

## 2020-04-30 MED ORDER — PROPOFOL 10 MG/ML IV BOLUS
INTRAVENOUS | Status: DC | PRN
  Administered 2020-04-30: 30 mg via INTRAVENOUS
  Administered 2020-04-30: 80 mg via INTRAVENOUS

## 2020-04-30 MED ORDER — SODIUM CHLORIDE 0.9 % IV SOLN: INTRAVENOUS | Status: DC

## 2020-04-30 MED ORDER — LIDOCAINE 2% (20 MG/ML) 5 ML SYRINGE
INTRAMUSCULAR | Status: DC | PRN
  Administered 2020-04-30: 40 mg via INTRAVENOUS

## 2020-04-30 NOTE — Anesthesia Preprocedure Evaluation (Addendum)
Anesthesia Evaluation  Patient identified by MRN, date of birth, ID band Patient awake    Reviewed: Allergy & Precautions, NPO status , Patient's Chart, lab work & pertinent test results, reviewed documented beta blocker date and time   Airway Mallampati: I  TM Distance: >3 FB Neck ROM: Full    Dental  (+) Teeth Intact, Dental Advisory Given   Pulmonary sleep apnea , former smoker,    breath sounds clear to auscultation       Cardiovascular hypertension, Pt. on medications and Pt. on home beta blockers + CAD  + dysrhythmias Atrial Fibrillation  Rhythm:Irregular Rate:Abnormal  Echo 07/2018 1. The left ventricle has normal systolic function, with an ejection fraction of 55-60%. The cavity size was mildly dilated. There is moderately increased left ventricular wall thickness. Left ventricular diastolic Doppler parameters are consistent with pseudonormalization.  2. The right ventricle has normal systolic function. The cavity was normal. There is no increase in right ventricular wall thickness.  3. Left atrial size was severely dilated.  4. Right atrial size was moderately dilated.  5. There is mild mitral annular calcification present.  6. The ascending aorta is normal in size and structure.  7. There is mild dilatation of the aortic root.  8. The interatrial septum was not assessed.    Neuro/Psych Anxiety    GI/Hepatic GERD  Medicated,  Endo/Other    Renal/GU      Musculoskeletal   Abdominal (+) + obese,   Peds  Hematology   Anesthesia Other Findings   Reproductive/Obstetrics                             Echo: 1. The left ventricle has normal systolic function, with an ejection fraction of 55-60%. The cavity size was mildly dilated. There is moderately increased left ventricular wall thickness. Left ventricular diastolic Doppler parameters are consistent with  pseudonormalization.  2.  The right ventricle has normal systolic function. The cavity was normal. There is no increase in right ventricular wall thickness.  3. Left atrial size was severely dilated.  4. Right atrial size was moderately dilated.  5. There is mild mitral annular calcification present.  6. The ascending aorta is normal in size and structure.  7. There is mild dilatation of the aortic root.  8. The interatrial septum was not assessed.  Anesthesia Physical  Anesthesia Plan  ASA: III  Anesthesia Plan: General   Post-op Pain Management:    Induction: Intravenous  PONV Risk Score and Plan: Propofol infusion and Treatment may vary due to age or medical condition  Airway Management Planned: Mask and Natural Airway  Additional Equipment: None  Intra-op Plan:   Post-operative Plan:   Informed Consent: I have reviewed the patients History and Physical, chart, labs and discussed the procedure including the risks, benefits and alternatives for the proposed anesthesia with the patient or authorized representative who has indicated his/her understanding and acceptance.       Plan Discussed with: CRNA  Anesthesia Plan Comments:         Anesthesia Quick Evaluation

## 2020-04-30 NOTE — Discharge Instructions (Signed)
Electrical Cardioversion Electrical cardioversion is the delivery of a jolt of electricity to restore a normal rhythm to the heart. A rhythm that is too fast or is not regular keeps the heart from pumping well. In this procedure, sticky patches or metal paddles are placed on the chest to deliver electricity to the heart from a device. This procedure may be done in an emergency if:  There is low or no blood pressure as a result of the heart rhythm.  Normal rhythm must be restored as fast as possible to protect the brain and heart from further damage.  It may save a life. This may also be a scheduled procedure for irregular or fast heart rhythms that are not immediately life-threatening. Tell a health care provider about:  Any allergies you have.  All medicines you are taking, including vitamins, herbs, eye drops, creams, and over-the-counter medicines.  Any problems you or family members have had with anesthetic medicines.  Any blood disorders you have.  Any surgeries you have had.  Any medical conditions you have.  Whether you are pregnant or may be pregnant. What are the risks? Generally, this is a safe procedure. However, problems may occur, including:  Allergic reactions to medicines.  A blood clot that breaks free and travels to other parts of your body.  The possible return of an abnormal heart rhythm within hours or days after the procedure.  Your heart stopping (cardiac arrest). This is rare. What happens before the procedure? Medicines  Your health care provider may have you start taking: ? Blood-thinning medicines (anticoagulants) so your blood does not clot as easily. ? Medicines to help stabilize your heart rate and rhythm.  Ask your health care provider about: ? Changing or stopping your regular medicines. This is especially important if you are taking diabetes medicines or blood thinners. ? Taking medicines such as aspirin and ibuprofen. These medicines can  thin your blood. Do not take these medicines unless your health care provider tells you to take them. ? Taking over-the-counter medicines, vitamins, herbs, and supplements. General instructions  Follow instructions from your health care provider about eating or drinking restrictions.  Plan to have someone take you home from the hospital or clinic.  If you will be going home right after the procedure, plan to have someone with you for 24 hours.  Ask your health care provider what steps will be taken to help prevent infection. These may include washing your skin with a germ-killing soap. What happens during the procedure?  An IV will be inserted into one of your veins.  Sticky patches (electrodes) or metal paddles may be placed on your chest.  You will be given a medicine to help you relax (sedative).  An electrical shock will be delivered. The procedure may vary among health care providers and hospitals.   What can I expect after the procedure?  Your blood pressure, heart rate, breathing rate, and blood oxygen level will be monitored until you leave the hospital or clinic.  Your heart rhythm will be watched to make sure it does not change.  You may have some redness on the skin where the shocks were given. Follow these instructions at home:  Do not drive for 24 hours if you were given a sedative during your procedure.  Take over-the-counter and prescription medicines only as told by your health care provider.  Ask your health care provider how to check your pulse. Check it often.  Rest for 48 hours after the procedure   or as told by your health care provider.  Avoid or limit your caffeine use as told by your health care provider.  Keep all follow-up visits as told by your health care provider. This is important. Contact a health care provider if:  You feel like your heart is beating too quickly or your pulse is not regular.  You have a serious muscle cramp that does not go  away. Get help right away if:  You have discomfort in your chest.  You are dizzy or you feel faint.  You have trouble breathing or you are short of breath.  Your speech is slurred.  You have trouble moving an arm or leg on one side of your body.  Your fingers or toes turn cold or blue. Summary  Electrical cardioversion is the delivery of a jolt of electricity to restore a normal rhythm to the heart.  This procedure may be done right away in an emergency or may be a scheduled procedure if the condition is not an emergency.  Generally, this is a safe procedure.  After the procedure, check your pulse often as told by your health care provider. This information is not intended to replace advice given to you by your health care provider. Make sure you discuss any questions you have with your health care provider. Document Revised: 09/30/2018 Document Reviewed: 09/30/2018 Elsevier Patient Education  2021 Elsevier Inc.  

## 2020-04-30 NOTE — Op Note (Signed)
Procedure: Electrical Cardioversion Indications:  Atrial Fibrillation  Procedure Details:  Consent: Risks of procedure as well as the alternatives and risks of each were explained to the (patient/caregiver).  Consent for procedure obtained.  Time Out: Verified patient identification, verified procedure, site/side was marked, verified correct patient position, special equipment/implants available, medications/allergies/relevent history reviewed, required imaging and test results available.  Performed  Patient placed on cardiac monitor, pulse oximetry, supplemental oxygen as necessary.  Sedation given: propofol 110 mg IV, Dr. Hart [place] Pacer pads placed anterior and posterior chest.  Cardioverted 3 time(s).  Cardioversion with synchronized biphasic 200J shock and pressure on chest. Never converted to normal rhythm.  Evaluation: Findings: Post procedure EKG shows: Atrial Fibrillation Complications: None Patient did tolerate procedure well.  Time Spent Directly with the Patient:  30 minutes   Jesus Barnes 04/30/2020, 8:43 AM

## 2020-04-30 NOTE — Interval H&P Note (Signed)
History and Physical Interval Note:  04/30/2020 8:17 AM  Jesus Barnes  has presented today for surgery, with the diagnosis of A-FIB.  The various methods of treatment have been discussed with the patient and family. After consideration of risks, benefits and other options for treatment, the patient has consented to  Procedure(s): CARDIOVERSION (N/A) as a surgical intervention.  The patient's history has been reviewed, patient examined, no change in status, stable for surgery.  I have reviewed the patient's chart and labs.  Questions were answered to the patient's satisfaction.     Mattis Featherly

## 2020-04-30 NOTE — Transfer of Care (Signed)
Immediate Anesthesia Transfer of Care Note  Patient: Jesus Barnes  Procedure(s) Performed: CARDIOVERSION (N/A )  Patient Location: Endoscopy Unit  Anesthesia Type:General  Level of Consciousness: drowsy and patient cooperative  Airway & Oxygen Therapy: Patient Spontanous Breathing  Post-op Assessment: Report given to RN  Post vital signs: Reviewed and stable  Last Vitals:  Vitals Value Taken Time  BP    Temp    Pulse    Resp    SpO2      Last Pain:  Vitals:   04/30/20 0752  TempSrc: Oral  PainSc: 0-No pain         Complications: No complications documented.

## 2020-04-30 NOTE — Anesthesia Postprocedure Evaluation (Signed)
Anesthesia Post Note  Patient: DURANT SCIBILIA  Procedure(s) Performed: CARDIOVERSION (N/A )     Patient location during evaluation: PACU Anesthesia Type: General Level of consciousness: awake and alert Pain management: pain level controlled Vital Signs Assessment: post-procedure vital signs reviewed and stable Respiratory status: spontaneous breathing, nonlabored ventilation, respiratory function stable and patient connected to nasal cannula oxygen Cardiovascular status: blood pressure returned to baseline and stable Postop Assessment: no apparent nausea or vomiting Anesthetic complications: no   No complications documented.  Last Vitals:  Vitals:   04/30/20 0850 04/30/20 0900  BP:  105/67  Pulse: 85 98  Resp: 17 (!) 21  Temp:    SpO2: 97% 96%    Last Pain:  Vitals:   04/30/20 0845  TempSrc: Oral  PainSc: 0-No pain                 [name]

## 2020-05-02 ENCOUNTER — Encounter (HOSPITAL_COMMUNITY): Payer: Self-pay | Admitting: Cardiovascular Disease

## 2020-05-03 ENCOUNTER — Telehealth: Payer: Self-pay | Admitting: Cardiology

## 2020-05-04 NOTE — Telephone Encounter (Signed)
Had a question about going back to work post cardioversion that didn't regulate heart rhythm

## 2020-05-06 ENCOUNTER — Other Ambulatory Visit: Payer: Self-pay | Admitting: Cardiology

## 2020-05-06 NOTE — Telephone Encounter (Signed)
Refill sent to pharmacy.   

## 2020-05-10 ENCOUNTER — Encounter: Payer: Self-pay | Admitting: *Deleted

## 2020-05-10 ENCOUNTER — Ambulatory Visit (INDEPENDENT_AMBULATORY_CARE_PROVIDER_SITE_OTHER): Payer: 59 | Admitting: Cardiology

## 2020-05-10 ENCOUNTER — Other Ambulatory Visit: Payer: Self-pay

## 2020-05-10 ENCOUNTER — Encounter: Payer: Self-pay | Admitting: Cardiology

## 2020-05-10 VITALS — BP 124/96 | HR 113 | Ht 75.0 in | Wt 381.0 lb

## 2020-05-10 DIAGNOSIS — R0683 Snoring: Secondary | ICD-10-CM

## 2020-05-10 DIAGNOSIS — Z01812 Encounter for preprocedural laboratory examination: Secondary | ICD-10-CM | POA: Diagnosis not present

## 2020-05-10 DIAGNOSIS — R400 Somnolence: Secondary | ICD-10-CM | POA: Diagnosis not present

## 2020-05-10 DIAGNOSIS — Z79899 Other long term (current) drug therapy: Secondary | ICD-10-CM

## 2020-05-10 DIAGNOSIS — I4819 Other persistent atrial fibrillation: Secondary | ICD-10-CM | POA: Diagnosis not present

## 2020-05-10 MED ORDER — DILTIAZEM HCL 30 MG PO TABS: 30.0000 mg | ORAL_TABLET | Freq: Four times a day (QID) | ORAL | 1 refills | Status: DC | PRN

## 2020-05-10 MED ORDER — METOPROLOL SUCCINATE ER 100 MG PO TB24: 100.0000 mg | ORAL_TABLET | Freq: Every day | ORAL | 3 refills | Status: DC

## 2020-05-10 MED ORDER — AMIODARONE HCL 200 MG PO TABS: ORAL_TABLET | ORAL | 0 refills | Status: DC

## 2020-05-10 MED ORDER — AMIODARONE HCL 200 MG PO TABS: 200.0000 mg | ORAL_TABLET | Freq: Every day | ORAL | 1 refills | Status: DC

## 2020-05-10 NOTE — Progress Notes (Signed)
Electrophysiology Office Note   Date:  05/10/2020   ID:  CHAUN Barnes, DOB 1971/03/13, MRN [id number]  PCP:  Jeanie Sewer, NP  Cardiologist:  Bettina Gavia Primary Electrophysiologist:  Glenys Snader Meredith Leeds, MD    Chief Complaint: AF   History of Present Illness: Jesus Barnes is a 50 y.o. male who is being seen today for the evaluation of AF at the request of Jeanie Sewer, NP. Presenting today for electrophysiology evaluation.  He has a history significant for atrial fibrillation, hypertension, coronary artery disease, morbid obesity, and sleep apnea.  He has had multiple cardioversions in the past, he has been loaded on dofetilide.  Unfortunately has gone back into atrial fibrillation.  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  He currently has a BMI of 48.  He was taking his dofetilide and was in sinus rhythm, but ran out of the medication.  He was told that he needed a cardiology appointment, but 1 cannot be scheduled he ran out of dofetilide.  He reverted back to atrial fibrillation.  He saw cardiology and dofetilide was stopped.  He had an attempted cardioversion, but unfortunately did not get back into normal rhythm.  His main symptoms are weakness, fatigue, shortness of breath.  He is interested in ablation.  He has sleep apnea, though he cannot tolerate his CPAP.  Today, denies symptoms of palpitations, chest pain, orthopnea, PND, lower extremity edema, claudication, dizziness, presyncope, syncope, bleeding, or neurologic sequela. The patient is tolerating medications without difficulties.     Past Medical History:  Diagnosis Date  . Anxiety 09/02/2014  . Burn   . Chronic anticoagulation 10/16/2016  . Dilated cardiomyopathy ([place]) 10/16/2016   EF 30-35%, 05/15/16 40%, 07/17/18 55-60%  . Dysphagia   . Esophageal stricture 09/02/2014  . Essential hypertension 09/01/2014  . GERD (gastroesophageal reflux disease)   . 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[place])   .  Hypertension   . Hypertension, essential 08/08/2018  . Hypertensive heart disease 09/01/2014  . Mild CAD 10/16/2016  . Morbid obesity with body mass index (BMI) of 45.0 to 49.9 in adult [facility]) 04/05/2016  . Obstructive sleep apnea    awaiting treatment  . On amiodarone therapy 10/16/2016  . Palpitation   . Paroxysmal atrial fibrillation ([place]) 12/12/2016  . Persistent atrial fibrillation ([place]) 09/01/2014   Overview:  CHADS2=1   Past Surgical History:  Procedure Laterality Date  . APPENDECTOMY    . CARDIOVERSION    . CARDIOVERSION N/A 12/14/2016   Procedure: CARDIOVERSION;  Surgeon: Evans Lance, MD;  Location: [facility];  Service: Cardiovascular;  Laterality: N/A;  . CARDIOVERSION N/A 08/15/2018   Procedure: CARDIOVERSION;  Surgeon: Sanda Klein, MD;  [facility];  Service: Cardiovascular;  Laterality: N/A;  . CARDIOVERSION N/A 04/30/2020   Procedure: CARDIOVERSION;  Surgeon: Sanda Klein, MD;  Location: [facility];  Service: Cardiovascular;  Laterality: N/A;  . COLONOSCOPY  06/18/2001    Bx: Tubulovillous adenoma. Done by Dr. Melina Copa. He was told to come back 86yrlater, but pt didnot.   . ESOPHAGOGASTRODUODENOSCOPY  12/17/2014   mild gastritis. Small antral polyps (likely inflammatory). Status post empiric esophageal dilatation  . HERNIA REPAIR     umblical  . KNEE SURGERY    . SKIN GRAFT       Current Outpatient Medications  Medication Sig Dispense Refill  . amiodarone (PACERONE) 200 MG tablet Take 2 tablets (400 mg total) TWICE a day for 2 weeks, then reduce and take 1  tablet (200 mg total) TWICE a day for 2 weeks, then take 1 tablet ONCE daily 84 tablet 0  . amiodarone (PACERONE) 200 MG tablet Take 1 tablet (200 mg total) by mouth daily. 90 tablet 1  . atorvastatin (LIPITOR) 10 MG tablet Take 10 mg by mouth daily.    Marland Kitchen buPROPion (WELLBUTRIN XL) 150 MG 24 hr tablet Take 450 mg by mouth daily.    . clonazePAM (KLONOPIN) 1 MG tablet Take 1 mg by mouth daily.     Marland Kitchen diltiazem (CARDIZEM) 30 MG tablet Take 1 tablet (30 mg total) by mouth 4 (four) times daily as needed (for elevated heart rates greater than 110). 30 tablet 1  . furosemide (LASIX) 40 MG tablet TAKE ONE TABLET BY MOUTH EVERY DAY (Patient taking differently: Take 40 mg by mouth daily.) 30 tablet 3  . lisinopril (ZESTRIL) 10 MG tablet Take 10 mg by mouth daily.     . metoprolol succinate (TOPROL-XL) 100 MG 24 hr tablet Take 1 tablet (100 mg total) by mouth daily. Take with or immediately following a meal. 60 tablet 3  . nitroGLYCERIN (NITROSTAT) 0.4 MG SL tablet Place 0.4 mg under the tongue every 5 (five) minutes as needed for chest pain.    . pantoprazole (PROTONIX) 40 MG tablet Take 1 tablet (40 mg total) by mouth 2 (two) times daily. (Patient taking differently: Take 40 mg by mouth every morning.) 60 tablet 3  . XARELTO 20 MG TABS tablet TAKE 1 TABLET BY MOUTH ONCE DAILY WITH SUPPER (Patient taking differently: Take 20 mg by mouth daily with supper.) 90 tablet 1   No current facility-administered medications for this visit.    Allergies:   Patient has no known allergies.   Social History:  The patient  reports that he has quit smoking. His smoking use included cigars. He has never used smokeless tobacco. He reports current alcohol use. He reports that he does not use drugs.   Family History:  The patient's family history includes Diabetes in his paternal grandfather; Heart disease in his paternal grandfather.    ROS:  Please see the history of present illness.   Otherwise, review of systems is positive for none.   All other systems are reviewed and negative.    PHYSICAL EXAM: VS:  BP (!) 124/96   Pulse (!) 113   Ht _0  (1.905 m)   Wt (!) 381 lb (172.8 kg)   SpO2 95%   BMI 47.62 kg/m  , BMI Body mass index is 47.62 kg/m. GEN: Well nourished, well developed, in no acute distress  HEENT: normal  Neck: no JVD, carotid bruits, or masses Cardiac: RRR; no murmurs, rubs, or  gallops,no edema  Respiratory:  clear to auscultation bilaterally, normal work of breathing GI: soft, nontender, nondistended, + BS MS: no deformity or atrophy  Skin: warm and dry Neuro:  Strength and sensation are intact Psych: euthymic mood, full affect  EKG:  EKG is ordered today. Personal review of the ekg ordered shows sinus rhythm, inferior infarct, rate 63, QTc 472 ms  Recent Labs: 04/26/2020: BUN 19; Creatinine, Ser 0.93; Hemoglobin 14.7; NT-Pro BNP 1,421; Platelets 272; Potassium 4.1; Sodium 141    Lipid Panel  No results found for: CHOL, TRIG, HDL, CHOLHDL, VLDL, LDLCALC, LDLDIRECT   Wt Readings from Last 3 Encounters:  05/10/20 (!) 381 lb (172.8 kg)  04/26/20 (!) 381 lb 3.2 oz (172.9 kg)  01/06/19 (!) 392 lb 6.4 oz (178 kg)  Other studies Reviewed: Additional studies/ records that were reviewed today include: TTE 07/17/18  Review of the above records today demonstrates:   1. The left ventricle has normal systolic function, with an ejection fraction of 55-60%. The cavity size was mildly dilated. There is moderately increased left ventricular wall thickness. Left ventricular diastolic Doppler parameters are consistent with  pseudonormalization.  2. The right ventricle has normal systolic function. The cavity was normal. There is no increase in right ventricular wall thickness.  3. Left atrial size was severely dilated.  4. Right atrial size was moderately dilated.  5. There is mild mitral annular calcification present.  6. The ascending aorta is normal in size and structure.  7. There is mild dilatation of the aortic root.  8. The interatrial septum was not assessed.   ASSESSMENT AND PLAN:  1.  Longstanding persistent atrial fibrillation: Apparently has had an ablation in the past.  Had an attempt at cardioversion, though unfortunately he did not convert.  His left atrium is severely dilated.  Unfortunately he failed his previous cardioversion.  I am concerned that  he Kanylah Muench not convert back to sinus rhythm.  I Cassandra Mcmanaman start him on amiodarone and plan for cardioversion in 3 weeks.  If he does not get back into normal rhythm with a cardioversion and amiodarone, he may not be a good candidate for ablation.  His rate is quite elevated.  He may feel better with a more improved rate.  We Luevenia Mcavoy stop metoprolol and start Toprol-XL 100 mg twice daily.  2.  Morbid obesity: Diet and exercise encouraged.  We discussed the possibility of bariatric surgery.  He Yamil Dougher consider this.  Alayzha An refer to [facility] clinic at [facility].  3.  Hypertension: Currently well controlled  4.  Obstructive sleep apnea: Unable to tolerate CPAP.  I Lisseth Brazeau discuss with sleep medicine whether or not there are other options.  Case discussed with primary cardiology  Current medicines are reviewed at length with the patient today.   The patient does not have concerns regarding his medicines.  The following changes were made today: Start amiodarone, Toprol-XL, stop metoprolol  Labs/ tests ordered today include:  Orders Placed This Encounter  Procedures  . Comp Met (CMET)  . TSH  . CBC  . Ambulatory referral to Cardiology  . EKG 12-Lead   Case discussed with referring physician  Disposition:   FU with Finola Rosal 3 months  Signed, Sherod Cisse Meredith Leeds, MD  05/10/2020 4:17 PM     [address] [place] [address] [phone] (office) [phone] (fax)

## 2020-05-10 NOTE — H&P (View-Only) (Signed)
 Electrophysiology Office Note   Date:  05/10/2020   ID:  Jesus Barnes, DOB 09/07/1970, MRN [id number]  PCP:  Hudnell, Stephanie, NP  Cardiologist:  [place] Primary Electrophysiologist:  Amarie Viles Martin Breanne Olvera, MD    Chief Complaint: AF   History of Present Illness: Jesus Barnes is a 49 y.o. male who is being seen today for the evaluation of AF at the request of Hudnell, Stephanie, NP. Presenting today for electrophysiology evaluation.  He has a history significant for atrial fibrillation, hypertension, coronary artery disease, morbid obesity, and sleep apnea.  He has had multiple cardioversions in the past, he has been loaded on dofetilide.  Unfortunately has gone back into atrial fibrillation.  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  He currently has a BMI of 48.  He was taking his dofetilide and was in sinus rhythm, but ran out of the medication.  He was told that he needed a cardiology appointment, but 1 cannot be scheduled he ran out of dofetilide.  He reverted back to atrial fibrillation.  He saw cardiology and dofetilide was stopped.  He had an attempted cardioversion, but unfortunately did not get back into normal rhythm.  His main symptoms are weakness, fatigue, shortness of breath.  He is interested in ablation.  He has sleep apnea, though he cannot tolerate his CPAP.  Today, denies symptoms of palpitations, chest pain, orthopnea, PND, lower extremity edema, claudication, dizziness, presyncope, syncope, bleeding, or neurologic sequela. The patient is tolerating medications without difficulties.     Past Medical History:  Diagnosis Date  . Anxiety 09/02/2014  . Burn   . Chronic anticoagulation 10/16/2016  . Dilated cardiomyopathy ([facility]) 10/16/2016   EF 30-35%, 05/15/16 40%, 07/17/18 55-60%  . Dysphagia   . Esophageal stricture 09/02/2014  . Essential hypertension 09/01/2014  . GERD (gastroesophageal reflux disease)   . 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[facility])   .  Hypertension   . Hypertension, essential 08/08/2018  . Hypertensive heart disease 09/01/2014  . Mild CAD 10/16/2016  . Morbid obesity with body mass index (BMI) of 45.0 to 49.9 in adult ([facility]) 04/05/2016  . Obstructive sleep apnea    awaiting treatment  . On amiodarone therapy 10/16/2016  . Palpitation   . Paroxysmal atrial fibrillation ([facility]) 12/12/2016  . Persistent atrial fibrillation ([facility]) 09/01/2014   Overview:  CHADS2=1   Past Surgical History:  Procedure Laterality Date  . APPENDECTOMY    . CARDIOVERSION    . CARDIOVERSION N/A 12/14/2016   Procedure: CARDIOVERSION;  Surgeon: Taylor, Gregg W, MD;  [facility];  Service: Cardiovascular;  Laterality: N/A;  . CARDIOVERSION N/A 08/15/2018   Procedure: CARDIOVERSION;  Surgeon: Croitoru, Mihai, MD;  [facility];  Service: Cardiovascular;  Laterality: N/A;  . CARDIOVERSION N/A 04/30/2020   Procedure: CARDIOVERSION;  Surgeon: Croitoru, Mihai, MD;  [facility];  Service: Cardiovascular;  Laterality: N/A;  . COLONOSCOPY  06/18/2001    Bx: Tubulovillous adenoma. Done by Dr. Butler. He was told to come back 1yr later, but pt didnot.   . ESOPHAGOGASTRODUODENOSCOPY  12/17/2014   mild gastritis. Small antral polyps (likely inflammatory). Status post empiric esophageal dilatation  . HERNIA REPAIR     umblical  . KNEE SURGERY    . SKIN GRAFT       Current Outpatient Medications  Medication Sig Dispense Refill  . amiodarone (PACERONE) 200 MG tablet Take 2 tablets (400 mg total) TWICE a day for 2 weeks, then reduce and take 1   tablet (200 mg total) TWICE a day for 2 weeks, then take 1 tablet ONCE daily 84 tablet 0  . amiodarone (PACERONE) 200 MG tablet Take 1 tablet (200 mg total) by mouth daily. 90 tablet 1  . atorvastatin (LIPITOR) 10 MG tablet Take 10 mg by mouth daily.    . buPROPion (WELLBUTRIN XL) 150 MG 24 hr tablet Take 450 mg by mouth daily.    . clonazePAM (KLONOPIN) 1 MG tablet Take 1 mg by mouth daily.     . diltiazem (CARDIZEM) 30 MG tablet Take 1 tablet (30 mg total) by mouth 4 (four) times daily as needed (for elevated heart rates greater than 110). 30 tablet 1  . furosemide (LASIX) 40 MG tablet TAKE ONE TABLET BY MOUTH EVERY DAY (Patient taking differently: Take 40 mg by mouth daily.) 30 tablet 3  . lisinopril (ZESTRIL) 10 MG tablet Take 10 mg by mouth daily.     . metoprolol succinate (TOPROL-XL) 100 MG 24 hr tablet Take 1 tablet (100 mg total) by mouth daily. Take with or immediately following a meal. 60 tablet 3  . nitroGLYCERIN (NITROSTAT) 0.4 MG SL tablet Place 0.4 mg under the tongue every 5 (five) minutes as needed for chest pain.    . pantoprazole (PROTONIX) 40 MG tablet Take 1 tablet (40 mg total) by mouth 2 (two) times daily. (Patient taking differently: Take 40 mg by mouth every morning.) 60 tablet 3  . XARELTO 20 MG TABS tablet TAKE 1 TABLET BY MOUTH ONCE DAILY WITH SUPPER (Patient taking differently: Take 20 mg by mouth daily with supper.) 90 tablet 1   No current facility-administered medications for this visit.    Allergies:   Patient has no known allergies.   Social History:  The patient  reports that he has quit smoking. His smoking use included cigars. He has never used smokeless tobacco. He reports current alcohol use. He reports that he does not use drugs.   Family History:  The patient's family history includes Diabetes in his paternal grandfather; Heart disease in his paternal grandfather.    ROS:  Please see the history of present illness.   Otherwise, review of systems is positive for none.   All other systems are reviewed and negative.    PHYSICAL EXAM: VS:  BP (!) 124/96   Pulse (!) 113   Ht 6' 3" (1.905 m)   Wt (!) 381 lb (172.8 kg)   SpO2 95%   BMI 47.62 kg/m  , BMI Body mass index is 47.62 kg/m. GEN: Well nourished, well developed, in no acute distress  HEENT: normal  Neck: no JVD, carotid bruits, or masses Cardiac: RRR; no murmurs, rubs, or  gallops,no edema  Respiratory:  clear to auscultation bilaterally, normal work of breathing GI: soft, nontender, nondistended, + BS MS: no deformity or atrophy  Skin: warm and dry Neuro:  Strength and sensation are intact Psych: euthymic mood, full affect  EKG:  EKG is ordered today. Personal review of the ekg ordered shows sinus rhythm, inferior infarct, rate 63, QTc 472 ms  Recent Labs: 04/26/2020: BUN 19; Creatinine, Ser 0.93; Hemoglobin 14.7; NT-Pro BNP 1,421; Platelets 272; Potassium 4.1; Sodium 141    Lipid Panel  No results found for: CHOL, TRIG, HDL, CHOLHDL, VLDL, LDLCALC, LDLDIRECT   Wt Readings from Last 3 Encounters:  05/10/20 (!) 381 lb (172.8 kg)  04/26/20 (!) 381 lb 3.2 oz (172.9 kg)  01/06/19 (!) 392 lb 6.4 oz (178 kg)        Other studies Reviewed: Additional studies/ records that were reviewed today include: TTE 07/17/18  Review of the above records today demonstrates:   1. The left ventricle has normal systolic function, with an ejection fraction of 55-60%. The cavity size was mildly dilated. There is moderately increased left ventricular wall thickness. Left ventricular diastolic Doppler parameters are consistent with  pseudonormalization.  2. The right ventricle has normal systolic function. The cavity was normal. There is no increase in right ventricular wall thickness.  3. Left atrial size was severely dilated.  4. Right atrial size was moderately dilated.  5. There is mild mitral annular calcification present.  6. The ascending aorta is normal in size and structure.  7. There is mild dilatation of the aortic root.  8. The interatrial septum was not assessed.   ASSESSMENT AND PLAN:  1.  Longstanding persistent atrial fibrillation: Apparently has had an ablation in the past.  Had an attempt at cardioversion, though unfortunately he did not convert.  His left atrium is severely dilated.  Unfortunately he failed his previous cardioversion.  Jesus am concerned that  he Jesus Barnes not convert back to sinus rhythm.  Jesus Jesus Barnes start him on amiodarone and plan for cardioversion in 3 weeks.  If he does not get back into normal rhythm with a cardioversion and amiodarone, he may not be a good candidate for ablation.  His rate is quite elevated.  He may feel better with a more improved rate.  Jesus Barnes stop metoprolol and start Toprol-XL 100 mg twice daily.  2.  Morbid obesity: Diet and exercise encouraged.  Jesus discussed the possibility of bariatric surgery.  He Jesus Barnes consider this.  Jesus Barnes refer to weight loss clinic at [facility].  3.  Hypertension: Currently well controlled  4.  Obstructive sleep apnea: Unable to tolerate CPAP.  Jesus Barnes discuss with sleep medicine whether or not there are other options.  Case discussed with primary cardiology  Current medicines are reviewed at length with the patient today.   The patient does not have concerns regarding his medicines.  The following changes were made today: Start amiodarone, Toprol-XL, stop metoprolol  Labs/ tests ordered today include:  Orders Placed This Encounter  Procedures  . Comp Met (CMET)  . TSH  . CBC  . Ambulatory referral to Cardiology  . EKG 12-Lead   Case discussed with referring physician  Disposition:   FU with Jesus Barnes 3 months  Signed, Jesus Barnes Martin Kieran Arreguin, MD  05/10/2020 4:17 PM     [facility] [address] [place] [address] [phone] (office) [phone] (fax) 

## 2020-05-10 NOTE — Patient Instructions (Addendum)
Medication Instructions:  Your physician has recommended you make the following change in your medication:  1. TAKE Diltiazem 30 mg as needed every 6 hours for your afib 2. STOP Metoprolol Tartrate (Lopressor) 3. START Metoprolol Succinate 100 mg TWICE a day 4. START Amiodarone  - take 2 tablets (400 mg total) TWICE a day for 2 weeks, then  - take 1 tablet (200 mg total) TWICE a day for 2 weeks, then  - take 1 tablet (200 mg total) ONCE a day  -   *If you need a refill on your cardiac medications before your next appointment, please call your pharmacy*   Lab Work: Pre procedure labs & Amiodarone survelliance lab today: CMET, TSH & CBC If you have labs (blood work) drawn today and your tests are completely normal, you will receive your results only by: Marland Kitchen MyChart Message (if you have MyChart) OR . A paper copy in the mail If you have any lab test that is abnormal or we need to change your treatment, we will call you to review the results.   Testing/Procedures: Your physician has recommended that you have a Cardioversion (DCCV). Electrical Cardioversion uses a jolt of electricity to your heart either through paddles or wired patches attached to your chest. This is a controlled, usually prescheduled, procedure. Defibrillation is done under light anesthesia in the hospital, and you usually go home the day of the procedure. This is done to get your heart back into a normal rhythm. You are not awake for the procedure. Please see the instruction sheet below located under "other instructions".   Follow-Up: At [facility], you and your health needs are our priority.  As part of our continuing mission to provide you with exceptional heart care, we have created designated Provider Care Teams.  These Care Teams include your primary Cardiologist (physician) and Advanced Practice Providers (APPs -  Physician Assistants and Nurse Practitioners) who all work together to provide you with the care you  need, when you need it.  We recommend signing up for the patient portal called "MyChart".  Sign up information is provided on this After Visit Summary.  MyChart is used to connect with patients for Virtual Visits (Telemedicine).  Patients are able to view lab/test results, encounter notes, upcoming appointments, etc.  Non-urgent messages can be sent to your provider as well.   To learn more about what you can do with MyChart, go to [email or web address].    Your next appointment:   3 month(s)  The format for your next appointment:   In Person  Provider:   Loman Brooklyn, MD    Thank you for choosing [facility]!!   Dory Horn, RN [phone]   Other Instructions  COVID TEST-- On 05/31/2020 @ 1:00 pm - You will go to [address] [place], [place] for your Covid testing.   This is a drive thru test site, stay in your car and the nurse team will come to your car to test you.  After you are tested please go home and self quarantine until the day of your procedure.    You are scheduled for a Cardioversion on 06/02/2020 with Dr. Elberta Fortis.  Please arrive at the [facility] (Main Entrance A) at [facility]: [address] [place], [place] [address] at 1:30 pm.   DIET: Nothing to eat or drink after midnight except a sip of water with medications (see medication instructions below)  Medication Instructions: Hold your Lasix the morning of your procedure.  You may take your remaining medications with sips of water.  Continue your anticoagulant: Xarelto You will need to continue your anticoagulant after your procedure until you  are told by your provider that it is safe to stop   Labs: today 2/28  You must have a responsible person to drive you home and stay in the waiting area during your procedure. Failure to do so could result in cancellation.  Bring your insurance cards.  *Special Note: Every effort is made to have your procedure done on time. Occasionally there  are emergencies that occur at the hospital that may cause delays. Please be patient if a delay does occur.      Bariatric Surgery You have so much to gain by losing weight.  You may have already tried every diet and exercise plan imaginable.  And, you may have sought advice from your family physician, too.   Sometimes, in spite of such diligent efforts, you may not be able to achieve long-term results by yourself.  In cases of severe obesity, bariatric or weight loss surgery is a proven method of achieving long-term weight control.  Our Services Our bariatric surgery programs offer our patients new hope and long-term weight-loss solution.  Since introducing our services in 2003, we have conducted more than 2,400 successful procedures.  Our program is designated as a Investment banker, corporate by the Metabolic and Bariatric Surgery Accreditation and Quality Improvement Program (MBSAQIP), a Child psychotherapist that sets rigorous patient safety and outcome standards.  Our program is also designated as a Engineer, manufacturing systems by [employer].   Our exceptional weight-loss surgery team specializes in diagnosis, treatment, follow-up care, and ongoing support for our patients with severe weight loss challenges.  We currently offer laparoscopic sleeve gastrectomy, gastric bypass, and adjustable gastric band (LAP-BAND).    Attend our Bariatrics Seminar Choosing to undergo a bariatric procedure is a big decision, and one that should not be taken lightly.  You now have two options in how you learn about weight-loss surgery - in person or online.  Our objective is to ensure you have all of the information that you need to evaluate the advantages and obligations of this life changing procedure.  Please note that you are not alone in this process, and our experienced team is ready to assist and answer all of your questions.  There are several ways to register for a seminar (either on-line or in  person): 1)  Call [phone] 2) Go on-line to [facility] and register for either type of seminar.  [email or web address]

## 2020-05-11 ENCOUNTER — Telehealth: Payer: Self-pay | Admitting: Cardiology

## 2020-05-11 DIAGNOSIS — R400 Somnolence: Secondary | ICD-10-CM

## 2020-05-11 LAB — CBC
Hematocrit: 42.9 % (ref 37.5–51.0)
Hemoglobin: 14.7 g/dL (ref 13.0–17.7)
MCH: 30.3 pg (ref 26.6–33.0)
MCHC: 34.3 g/dL (ref 31.5–35.7)
MCV: 89 fL (ref 79–97)
Platelets: 294 10*3/uL (ref 150–450)
RBC: 4.85 x10E6/uL (ref 4.14–5.80)
RDW: 13.5 % (ref 11.6–15.4)
WBC: 8.1 10*3/uL (ref 3.4–10.8)

## 2020-05-11 LAB — COMPREHENSIVE METABOLIC PANEL
ALT: 22 IU/L (ref 0–44)
AST: 21 IU/L (ref 0–40)
Albumin/Globulin Ratio: 2 (ref 1.2–2.2)
Albumin: 4.4 g/dL (ref 3.8–4.9)
Alkaline Phosphatase: 95 IU/L (ref 44–121)
BUN/Creatinine Ratio: 19 (ref 9–20)
BUN: 19 mg/dL (ref 6–24)
Bilirubin Total: 0.3 mg/dL (ref 0.0–1.2)
CO2: 22 mmol/L (ref 20–29)
Calcium: 9.2 mg/dL (ref 8.7–10.2)
Chloride: 103 mmol/L (ref 96–106)
Creatinine, Ser: 0.98 mg/dL (ref 0.76–1.27)
Globulin, Total: 2.2 g/dL (ref 1.5–4.5)
Glucose: 100 mg/dL — ABNORMAL HIGH (ref 65–99)
Potassium: 3.9 mmol/L (ref 3.5–5.2)
Sodium: 140 mmol/L (ref 134–144)
Total Protein: 6.6 g/dL (ref 6.0–8.5)
eGFR: 93 mL/min/{1.73_m2} (ref >59)

## 2020-05-11 LAB — TSH: TSH: 3.79 u[IU]/mL (ref 0.450–4.500)

## 2020-05-11 NOTE — Addendum Note (Signed)
Addended by: Baird Lyons on: 05/11/2020 12:01 PM   Modules accepted: Orders

## 2020-05-11 NOTE — Telephone Encounter (Signed)
Order sent to sleep pool for precert.

## 2020-05-11 NOTE — Telephone Encounter (Signed)
Patient has referral to sleep studies from Dr. Elberta Fortis. Please place order for sleep study, thank you!

## 2020-05-15 ENCOUNTER — Telehealth: Payer: Self-pay | Admitting: *Deleted

## 2020-05-15 DIAGNOSIS — G4733 Obstructive sleep apnea (adult) (pediatric): Secondary | ICD-10-CM

## 2020-05-15 NOTE — Telephone Encounter (Signed)
Order sent to the sleep pool

## 2020-05-15 NOTE — Telephone Encounter (Signed)
-----   Message from Quintella Reichert, MD sent at 05/10/2020  3:44 PM EST ----- Please order a split night sleep study for OSA ASAP - needs to be done this week due to afib and needs repeat cardioversion once on PAP - he has OSA and stopped using PAP a while ago due to being intolerant to mask

## 2020-05-17 ENCOUNTER — Telehealth: Payer: Self-pay | Admitting: *Deleted

## 2020-05-17 NOTE — Telephone Encounter (Signed)
Staff message sent to Coralee North ok to schedule sleep study. [employer] and per automated system no PA is required. Call reference # [id number].

## 2020-05-18 ENCOUNTER — Ambulatory Visit: Payer: 59 | Admitting: Cardiology

## 2020-05-21 NOTE — Telephone Encounter (Signed)
Patient scheduled for 07/06/20 and has been made aware.

## 2020-05-24 ENCOUNTER — Other Ambulatory Visit: Payer: Self-pay | Admitting: Cardiology

## 2020-05-24 NOTE — Telephone Encounter (Signed)
Pharmacy notified Tikosyn d/c

## 2020-05-27 ENCOUNTER — Other Ambulatory Visit: Payer: Self-pay

## 2020-05-27 ENCOUNTER — Ambulatory Visit (INDEPENDENT_AMBULATORY_CARE_PROVIDER_SITE_OTHER): Payer: 59 | Admitting: Family Medicine

## 2020-05-27 ENCOUNTER — Encounter (INDEPENDENT_AMBULATORY_CARE_PROVIDER_SITE_OTHER): Payer: Self-pay | Admitting: Family Medicine

## 2020-05-27 VITALS — BP 98/63 | HR 79 | Temp 97.7°F | Ht 74.0 in | Wt 373.0 lb

## 2020-05-27 DIAGNOSIS — I10 Essential (primary) hypertension: Secondary | ICD-10-CM | POA: Diagnosis not present

## 2020-05-27 DIAGNOSIS — F32A Depression, unspecified: Secondary | ICD-10-CM

## 2020-05-27 DIAGNOSIS — G4733 Obstructive sleep apnea (adult) (pediatric): Secondary | ICD-10-CM

## 2020-05-27 DIAGNOSIS — R5383 Other fatigue: Secondary | ICD-10-CM | POA: Diagnosis not present

## 2020-05-27 DIAGNOSIS — R0602 Shortness of breath: Secondary | ICD-10-CM

## 2020-05-27 DIAGNOSIS — Z9189 Other specified personal risk factors, not elsewhere classified: Secondary | ICD-10-CM | POA: Diagnosis not present

## 2020-05-27 DIAGNOSIS — F419 Anxiety disorder, unspecified: Secondary | ICD-10-CM | POA: Diagnosis not present

## 2020-05-27 DIAGNOSIS — Z6841 Body Mass Index (BMI) 40.0 and over, adult: Secondary | ICD-10-CM

## 2020-05-27 DIAGNOSIS — Z0289 Encounter for other administrative examinations: Secondary | ICD-10-CM

## 2020-05-27 NOTE — Telephone Encounter (Signed)
Patient is scheduled for lab study on 07/06/20. Patient understands he sleep study will be done at [facility] sleep lab. Patient understands he will receive a sleep packet in a week or so. Patient understands to call if he does not receive the sleep packet in a timely manner. Patient agrees with treatment and thanked me for call.

## 2020-05-27 NOTE — Telephone Encounter (Signed)
Gaynelle Cage, CMA  Reesa Chew, CMA Ok to schedule sleep study. Per insurance automated system no PA is required.         From: Reesa Chew, CMA  Sent: 05/15/2020  1:51 PM EST  To: Gaynelle Cage, CMA  Subject: precert                      Split night , - needs to be done this week due to afib

## 2020-05-28 LAB — COMPREHENSIVE METABOLIC PANEL
ALT: 24 IU/L (ref 0–44)
AST: 32 IU/L (ref 0–40)
Albumin/Globulin Ratio: 1.6 (ref 1.2–2.2)
Albumin: 4.5 g/dL (ref 3.8–4.9)
Alkaline Phosphatase: 74 IU/L (ref 44–121)
BUN/Creatinine Ratio: 18 (ref 9–20)
BUN: 17 mg/dL (ref 6–24)
Bilirubin Total: 0.4 mg/dL (ref 0.0–1.2)
CO2: 21 mmol/L (ref 20–29)
Calcium: 9.2 mg/dL (ref 8.7–10.2)
Chloride: 100 mmol/L (ref 96–106)
Creatinine, Ser: 0.93 mg/dL (ref 0.76–1.27)
Globulin, Total: 2.8 g/dL (ref 1.5–4.5)
Glucose: 103 mg/dL — ABNORMAL HIGH (ref 65–99)
Potassium: 4.4 mmol/L (ref 3.5–5.2)
Sodium: 141 mmol/L (ref 134–144)
Total Protein: 7.3 g/dL (ref 6.0–8.5)
eGFR: 99 mL/min/{1.73_m2} (ref >59)

## 2020-05-28 LAB — HEMOGLOBIN A1C
Est. average glucose Bld gHb Est-mCnc: 108 mg/dL
Hgb A1c MFr Bld: 5.4 % (ref 4.8–5.6)

## 2020-05-28 LAB — LIPID PANEL WITH LDL/HDL RATIO
Cholesterol, Total: 165 mg/dL (ref 100–199)
HDL: 52 mg/dL (ref >39)
LDL Chol Calc (NIH): 94 mg/dL (ref 0–99)
LDL/HDL Ratio: 1.8 ratio (ref 0.0–3.6)
Triglycerides: 105 mg/dL (ref 0–149)
VLDL Cholesterol Cal: 19 mg/dL (ref 5–40)

## 2020-05-28 LAB — CBC WITH DIFFERENTIAL/PLATELET
Basophils Absolute: 0 10*3/uL (ref 0.0–0.2)
Basos: 0 %
EOS (ABSOLUTE): 0 10*3/uL (ref 0.0–0.4)
Eos: 1 %
Hematocrit: 47.3 % (ref 37.5–51.0)
Hemoglobin: 15.8 g/dL (ref 13.0–17.7)
Immature Grans (Abs): 0 10*3/uL (ref 0.0–0.1)
Immature Granulocytes: 0 %
Lymphocytes Absolute: 1.7 10*3/uL (ref 0.7–3.1)
Lymphs: 33 %
MCH: 29.9 pg (ref 26.6–33.0)
MCHC: 33.4 g/dL (ref 31.5–35.7)
MCV: 89 fL (ref 79–97)
Monocytes Absolute: 0.6 10*3/uL (ref 0.1–0.9)
Monocytes: 11 %
Neutrophils Absolute: 2.8 10*3/uL (ref 1.4–7.0)
Neutrophils: 55 %
Platelets: 306 10*3/uL (ref 150–450)
RBC: 5.29 x10E6/uL (ref 4.14–5.80)
RDW: 13.3 % (ref 11.6–15.4)
WBC: 5.2 10*3/uL (ref 3.4–10.8)

## 2020-05-28 LAB — TSH: TSH: 4.4 u[IU]/mL (ref 0.450–4.500)

## 2020-05-28 LAB — T3: T3, Total: 99 ng/dL (ref 71–180)

## 2020-05-28 LAB — FOLATE: Folate: 10.3 ng/mL (ref >3.0)

## 2020-05-28 LAB — T4: T4, Total: 6.5 ug/dL (ref 4.5–12.0)

## 2020-05-28 LAB — INSULIN, RANDOM: INSULIN: 15.3 u[IU]/mL (ref 2.6–24.9)

## 2020-05-28 LAB — VITAMIN D 25 HYDROXY (VIT D DEFICIENCY, FRACTURES): Vit D, 25-Hydroxy: 26.2 ng/mL — ABNORMAL LOW (ref 30.0–100.0)

## 2020-05-28 LAB — VITAMIN B12: Vitamin B-12: 695 pg/mL (ref 232–1245)

## 2020-05-31 ENCOUNTER — Other Ambulatory Visit (HOSPITAL_COMMUNITY)
Admission: RE | Admit: 2020-05-31 | Discharge: 2020-05-31 | Disposition: A | Discharge status: Home / Self Care | Payer: 59 | Source: Ambulatory Visit | Attending: Cardiology | Admitting: Cardiology

## 2020-05-31 DIAGNOSIS — Z20822 Contact with and (suspected) exposure to covid-19: Secondary | ICD-10-CM | POA: Insufficient documentation

## 2020-05-31 DIAGNOSIS — Z01812 Encounter for preprocedural laboratory examination: Secondary | ICD-10-CM | POA: Insufficient documentation

## 2020-06-01 LAB — SARS CORONAVIRUS 2 (TAT 6-24 HRS): SARS Coronavirus 2: NEGATIVE

## 2020-06-01 NOTE — Progress Notes (Signed)
Dear Dr. Elberta Fortis,   Thank you for referring Jesus Barnes to our clinic. The following note includes my evaluation and treatment recommendations.  Chief Complaint:   OBESITY KOEHN SALEHI (MR# [id number]) is a 50 y.o. male who presents for evaluation and treatment of obesity and related comorbidities. Current BMI is Body mass index is 47.89 kg/m. Andrik has been struggling with his weight for many years and has been unsuccessful in either losing weight, maintaining weight loss, or reaching his healthy weight goal.  Arsen is currently in the action stage of change and ready to dedicate time achieving and maintaining a healthier weight. Wacey is interested in becoming our patient and working on intensive lifestyle modifications including (but not limited to) diet and exercise for weight loss.  Franklin was referred by Dr. Elberta Fortis. He normally gets up, gets 1-2 cups of coffee and a pecan roll. Stopped by [employer] and got footlong with pepperoni, salami, ham, [place], roast beef, pepper jack cheese, jalapeno's, salt, and pepper; Went to [employer] and had smothered chicken over rice with pinto beans and chicken livers (split with wife). He previously lost over 100 lbs with Atkins and was unable to keep it off.  Tzvi's habits were reviewed today and are as follows: His family eats meals together, he thinks his family will eat healthier with him, his desired weight loss is 98 lbs, he has been heavy most of his life, he started gaining weight when he got married, his heaviest weight ever was 400 pounds, he has significant food cravings issues, he skips meals frequently, he is frequently drinking liquids with calories, he frequently makes poor food choices, he frequently eats larger portions than normal, he has binge eating behaviors and he struggles with emotional eating.  Depression Screen Deryk's Food and Mood (modified PHQ-9) score was 21.  Depression screen [facility] 2/9 05/27/2020  Decreased Interest 3   Down, Depressed, Hopeless 3  PHQ - 2 Score 6  Altered sleeping 3  Tired, decreased energy 3  Change in appetite 3  Feeling bad or failure about yourself  3  Trouble concentrating 0  Moving slowly or fidgety/restless 0  Suicidal thoughts 3  PHQ-9 Score 21  Difficult doing work/chores Extremely dIfficult   Subjective:   1. Other fatigue Denis admits to daytime somnolence and admits to waking up still tired. Patent has a history of symptoms of daytime fatigue. Jossue generally gets 6 hours or less of sleep per night, and states that he has poor sleep quality. Snoring is present. Apneic episodes are present. Epworth Sleepiness Score is 9. EKG- Atrial fibrillation on 05/10/2020.  2. SOB (shortness of breath) on exertion Rashon notes increasing shortness of breath with exercising and seems to be worsening over time with weight gain. He notes getting out of breath sooner with activity than he used to. This has gotten worse recently. Rector denies shortness of breath at rest or orthopnea. EKG- Atrial fibrillation on 05/10/2020.  3. Essential hypertension Ezariah's BP is controlled today. He denies chest pain, chest pressure and headache. He is on amiodarone, lisinopril, Lipitor, and Cardizem.  BP Readings from Last 3 Encounters:  05/27/20 98/63  05/10/20 (!) 124/96  04/30/20 105/67    4. Anxiety and depression Garhett denies suicidal or homicidal ideations. He is on Klonopin. He was previously on Wellbutrin, prescribed by Dulce Sellar, NP.  5. Obstructive sleep apnea Jeniel was told he had OSA and got a CPAP but couldn't tolerate it.  6. At risk  for impaired metabolic function Taevyn is at increased risk for impaired metabolic function due to untreated OSA.  Assessment/Plan:   1. Other fatigue Briar does feel that his weight is causing his energy to be lower than it should be. Fatigue may be related to obesity, depression or many other causes. Labs will be ordered, and in the meanwhile, Jonell will  focus on self care including making healthy food choices, increasing physical activity and focusing on stress reduction. Check labs today.  - Vitamin B12 - CBC with Differential/Platelet - Folate - Hemoglobin A1c - Insulin, random - T3 - T4 - VITAMIN D 25 Hydroxy (Vit-D Deficiency, Fractures) - TSH  2. SOB (shortness of breath) on exertion Jamarco does feel that he gets out of breath more easily that he used to when he exercises. Shawnn's shortness of breath appears to be obesity related and exercise induced. He has agreed to work on weight loss and gradually increase exercise to treat his exercise induced shortness of breath. Will continue to monitor closely.  3. Essential hypertension Tyhir is working on healthy weight loss and exercise to improve blood pressure control. We will watch for signs of hypotension as he continues his lifestyle modifications. Check labs today. Follow up with cardiology (pt sees them regularly).  - Comprehensive metabolic panel - Lipid Panel With LDL/HDL Ratio  4. Anxiety and depression Behavior modification techniques were discussed today to help Rodney deal with his anxiety.  Orders and follow up as documented in patient record. Follow up at next appointment.  5. Obstructive sleep apnea Intensive lifestyle modifications are the first line treatment for this issue. We discussed several lifestyle modifications today and he will continue to work on diet, exercise and weight loss efforts. We will continue to monitor. Orders and follow up as documented in patient record. Follow up at next appointments.  6. At risk for impaired metabolic function Sandor was given approximately 15 minutes of impaired  metabolic function prevention counseling today. We discussed intensive lifestyle modifications today with an emphasis on specific nutrition and exercise instructions and strategies.   Repetitive spaced learning was employed today to elicit superior memory formation and  behavioral change.  7. Class 3 severe obesity with serious comorbidity and body mass index (BMI) of 45.0 to 49.9 in adult, unspecified obesity type (HCC) Eathon is currently in the action stage of change and his goal is to continue with weight loss efforts. I recommend Husain begin the structured treatment plan as follows:  He has agreed to the Category 4 Plan + 200 calories.  Exercise goals: No exercise has been prescribed at this time.   Behavioral modification strategies: increasing lean protein intake, meal planning and cooking strategies, keeping healthy foods in the home and planning for success.  He was informed of the importance of frequent follow-up visits to maximize his success with intensive lifestyle modifications for his multiple health conditions. He was informed we would discuss his lab results at his next visit unless there is a critical issue that needs to be addressed sooner. Shiro agreed to keep his next visit at the agreed upon time to discuss these results.  Objective:   Blood pressure 98/63, pulse 79, temperature 97.7 F (36.5 C), temperature source Oral, height 6\' 2"  (1.88 m), weight (!) 373 lb (169.2 kg), SpO2 96 %. Body mass index is 47.89 kg/m.  EKG: Abnormal EKG- Atrial fibrillation, rate 113.  Indirect Calorimeter completed today shows a VO2 of 461 and a REE of 3212.  His calculated  basal metabolic rate is 8786 thus his basal metabolic rate is better than expected.  General: Cooperative, alert, well developed, in no acute distress. HEENT: Conjunctivae and lids unremarkable. Cardiovascular: Irregular irregular heartrate.  Lungs: Normal work of breathing. Neurologic: No focal deficits.   Lab Results  Component Value Date   CREATININE 0.93 05/27/2020   BUN 17 05/27/2020   NA 141 05/27/2020   K 4.4 05/27/2020   CL 100 05/27/2020   CO2 21 05/27/2020   Lab Results  Component Value Date   ALT 24 05/27/2020   AST 32 05/27/2020   ALKPHOS 74 05/27/2020    BILITOT 0.4 05/27/2020   Lab Results  Component Value Date   HGBA1C 5.4 05/27/2020   Lab Results  Component Value Date   INSULIN 15.3 05/27/2020   Lab Results  Component Value Date   TSH 4.400 05/27/2020   Lab Results  Component Value Date   CHOL 165 05/27/2020   HDL 52 05/27/2020   LDLCALC 94 05/27/2020   TRIG 105 05/27/2020   Lab Results  Component Value Date   WBC 5.2 05/27/2020   HGB 15.8 05/27/2020   HCT 47.3 05/27/2020   MCV 89 05/27/2020   PLT 306 05/27/2020    Attestation Statements:   Reviewed by clinician on day of visit: allergies, medications, problem list, medical history, surgical history, family history, social history, and previous encounter notes.  This is the patient's first visit at [facility]. The patient's NEW PATIENT PACKET was reviewed at length. Included in the packet: current and past health history, medications, allergies, ROS, gynecologic history (women only), surgical history, family history, social history, weight history, weight loss surgery history (for those that have had weight loss surgery), nutritional evaluation, mood and food questionnaire, PHQ9, Epworth questionnaire, sleep habits questionnaire, patient life and health improvement goals questionnaire. These will all be scanned into the patient's chart under media.   During the visit, I independently reviewed the patient's EKG, bioimpedance scale results, and indirect calorimeter results. I used this information to tailor a meal plan for the patient that will help him to lose weight and will improve his obesity-related conditions going forward. I performed a medically necessary appropriate examination and/or evaluation. I discussed the assessment and treatment plan with the patient. The patient was provided an opportunity to ask questions and all were answered. The patient agreed with the plan and demonstrated an understanding of the instructions. Labs were ordered at this  visit and will be reviewed at the next visit unless more critical results need to be addressed immediately. Clinical information was updated and documented in the EMR.   Time spent on visit including pre-visit chart review and post-visit care was 45 minutes.   A separate 15 minutes was spent on risk counseling (see above).    Edmund Hilda, am acting as transcriptionist for Reuben Likes, MD.  I have reviewed the above documentation for accuracy and completeness, and I agree with the above. - Katherina Mires, MD

## 2020-06-02 ENCOUNTER — Encounter (HOSPITAL_COMMUNITY)
Admission: RE | Disposition: A | Discharge status: Home / Self Care | Payer: Self-pay | Source: Ambulatory Visit | Attending: Cardiology

## 2020-06-02 ENCOUNTER — Ambulatory Visit (HOSPITAL_COMMUNITY): Admit: 2020-06-02 | Payer: 59 | Admitting: Cardiology

## 2020-06-02 ENCOUNTER — Other Ambulatory Visit: Payer: Self-pay

## 2020-06-02 ENCOUNTER — Ambulatory Visit (HOSPITAL_COMMUNITY): Payer: 59 | Admitting: Certified Registered"

## 2020-06-02 ENCOUNTER — Ambulatory Visit (HOSPITAL_COMMUNITY)
Admission: RE | Admit: 2020-06-02 | Discharge: 2020-06-02 | Disposition: A | Discharge status: Home / Self Care | Payer: 59 | Source: Ambulatory Visit | Attending: Cardiology | Admitting: Cardiology

## 2020-06-02 DIAGNOSIS — Z79899 Other long term (current) drug therapy: Secondary | ICD-10-CM | POA: Diagnosis not present

## 2020-06-02 DIAGNOSIS — I119 Hypertensive heart disease without heart failure: Secondary | ICD-10-CM | POA: Insufficient documentation

## 2020-06-02 DIAGNOSIS — Z6841 Body Mass Index (BMI) 40.0 and over, adult: Secondary | ICD-10-CM | POA: Diagnosis not present

## 2020-06-02 DIAGNOSIS — I4891 Unspecified atrial fibrillation: Secondary | ICD-10-CM | POA: Diagnosis not present

## 2020-06-02 DIAGNOSIS — G4733 Obstructive sleep apnea (adult) (pediatric): Secondary | ICD-10-CM | POA: Diagnosis not present

## 2020-06-02 DIAGNOSIS — Z87891 Personal history of nicotine dependence: Secondary | ICD-10-CM | POA: Diagnosis not present

## 2020-06-02 DIAGNOSIS — I4811 Longstanding persistent atrial fibrillation: Secondary | ICD-10-CM | POA: Insufficient documentation

## 2020-06-02 HISTORY — PX: CARDIOVERSION: EP1203

## 2020-06-02 LAB — PROTIME-INR
INR: 1.3 — ABNORMAL HIGH (ref 0.8–1.2)
Prothrombin Time: 15.3 seconds — ABNORMAL HIGH (ref 11.4–15.2)

## 2020-06-02 SURGERY — CARDIOVERSION (CATH LAB)
Anesthesia: General

## 2020-06-02 SURGERY — CARDIOVERSION (CATH LAB)
Anesthesia: LOCAL

## 2020-06-02 MED ORDER — LIDOCAINE HCL (CARDIAC) PF 100 MG/5ML IV SOSY
PREFILLED_SYRINGE | INTRAVENOUS | Status: DC | PRN
  Administered 2020-06-02: 100 mg via INTRAVENOUS

## 2020-06-02 MED ORDER — PROPOFOL 10 MG/ML IV BOLUS
INTRAVENOUS | Status: DC | PRN
  Administered 2020-06-02: 80 mg via INTRAVENOUS
  Administered 2020-06-02: 30 mg via INTRAVENOUS

## 2020-06-02 MED ORDER — LACTATED RINGERS IV SOLN: INTRAVENOUS | Status: DC | PRN

## 2020-06-02 SURGICAL SUPPLY — 1 items: PAD PRO RADIOLUCENT 2001M-C (PAD) ×2 IMPLANT

## 2020-06-02 NOTE — Anesthesia Postprocedure Evaluation (Signed)
Anesthesia Post Note  Patient: Jesus Barnes  Procedure(s) Performed: CARDIOVERSION (CATH LAB) (N/A )     Patient location during evaluation: Cath Lab Anesthesia Type: General Level of consciousness: awake and sedated Pain management: pain level controlled Vital Signs Assessment: post-procedure vital signs reviewed and stable Respiratory status: spontaneous breathing Cardiovascular status: stable Postop Assessment: no apparent nausea or vomiting Anesthetic complications: no   No complications documented.  Last Vitals:  Vitals:   06/02/20 1429  Pulse: 63  Resp: 15    Last Pain:  Vitals:   06/02/20 1429  PainSc: 0-No pain                 Caren Macadam

## 2020-06-02 NOTE — Interval H&P Note (Signed)
History and Physical Interval Note:  06/02/2020 1:46 PM  Jesus Barnes  has presented today for surgery, with the diagnosis of afib.  The various methods of treatment have been discussed with the patient and family. After consideration of risks, benefits and other options for treatment, the patient has consented to  Procedure(s): CARDIOVERSION (CATH LAB) (N/A) as a surgical intervention.  The patient's history has been reviewed, patient examined, no change in status, stable for surgery.  I have reviewed the patient's chart and labs.  Questions were answered to the patient's satisfaction.     Deny [name]

## 2020-06-02 NOTE — Transfer of Care (Signed)
Immediate Anesthesia Transfer of Care Note  Patient: Jesus Barnes  Procedure(s) Performed: CARDIOVERSION (CATH LAB) (N/A )  Patient Location: PACU and Cath Lab  Anesthesia Type:General  Level of Consciousness: awake, alert  and oriented  Airway & Oxygen Therapy: Patient Spontanous Breathing and Patient connected to face mask oxygen  Post-op Assessment: Report given to RN and Post -op Vital signs reviewed and stable  Post vital signs: Reviewed and stable  Last Vitals:  Vitals Value Taken Time  BP    Temp    Pulse 63 06/02/20 1429  Resp 15 06/02/20 1429  SpO2      Last Pain:  Vitals:   06/02/20 1429  PainSc: 0-No pain         Complications: No complications documented.

## 2020-06-02 NOTE — Anesthesia Preprocedure Evaluation (Signed)
Anesthesia Evaluation  Patient identified by MRN, date of birth, ID band Patient awake    Reviewed: Allergy & Precautions, NPO status , Patient's Chart, lab work & pertinent test results, reviewed documented beta blocker date and time   Airway Mallampati: I  TM Distance: >3 FB Neck ROM: Full    Dental  (+) Teeth Intact, Dental Advisory Given   Pulmonary sleep apnea , former smoker,    breath sounds clear to auscultation       Cardiovascular hypertension, Pt. on medications and Pt. on home beta blockers + CAD and +CHF  Normal cardiovascular exam+ dysrhythmias Atrial Fibrillation  Rhythm:Irregular Rate:Abnormal  Echo 07/2018 1. The left ventricle has normal systolic function, with an ejection fraction of 55-60%. The cavity size was mildly dilated. There is moderately increased left ventricular wall thickness. Left ventricular diastolic Doppler parameters are consistent with pseudonormalization.  2. The right ventricle has normal systolic function. The cavity was normal. There is no increase in right ventricular wall thickness.  3. Left atrial size was severely dilated.  4. Right atrial size was moderately dilated.  5. There is mild mitral annular calcification present.  6. The ascending aorta is normal in size and structure.  7. There is mild dilatation of the aortic root.  8. The interatrial septum was not assessed.    Neuro/Psych PSYCHIATRIC DISORDERS Anxiety Depression    GI/Hepatic Neg liver ROS, GERD  Medicated,  Endo/Other  negative endocrine ROSMorbid obesity  Renal/GU negative Renal ROS     Musculoskeletal   Abdominal (+) + obese,   Peds  Hematology   Anesthesia Other Findings   His echocardiogram shows normal left ventricular ejection fraction, however it implies that he is retaining fluid or heart failure and I would like to start him on furosemide 40 mg daily. In 1 week return to the office for BMP  proBNP level.  Reproductive/Obstetrics                             Echo: 1. The left ventricle has normal systolic function, with an ejection fraction of 55-60%. The cavity size was mildly dilated. There is moderately increased left ventricular wall thickness. Left ventricular diastolic Doppler parameters are consistent with  pseudonormalization.  2. The right ventricle has normal systolic function. The cavity was normal. There is no increase in right ventricular wall thickness.  3. Left atrial size was severely dilated.  4. Right atrial size was moderately dilated.  5. There is mild mitral annular calcification present.  6. The ascending aorta is normal in size and structure.  7. There is mild dilatation of the aortic root.  8. The interatrial septum was not assessed.  Anesthesia Physical  Anesthesia Plan  ASA: III  Anesthesia Plan: General   Post-op Pain Management:    Induction: Intravenous  PONV Risk Score and Plan: Propofol infusion and Treatment may vary due to age or medical condition  Airway Management Planned: Mask and Natural Airway  Additional Equipment: None  Intra-op Plan:   Post-operative Plan:   Informed Consent: I have reviewed the patients History and Physical, chart, labs and discussed the procedure including the risks, benefits and alternatives for the proposed anesthesia with the patient or authorized representative who has indicated his/her understanding and acceptance.     Dental advisory given  Plan Discussed with: CRNA  Anesthesia Plan Comments:         Anesthesia Quick Evaluation

## 2020-06-02 NOTE — Discharge Instructions (Signed)
Electrical Cardioversion Electrical cardioversion is the delivery of a jolt of electricity to restore a normal rhythm to the heart. A rhythm that is too fast or is not regular keeps the heart from pumping well. In this procedure, sticky patches or metal paddles are placed on the chest to deliver electricity to the heart from a device. This procedure may be done in an emergency if:  There is low or no blood pressure as a result of the heart rhythm.  Normal rhythm must be restored as fast as possible to protect the brain and heart from further damage.  It may save a life. This may also be a scheduled procedure for irregular or fast heart rhythms that are not immediately life-threatening. Tell a health care provider about:  Any allergies you have.  All medicines you are taking, including vitamins, herbs, eye drops, creams, and over-the-counter medicines.  Any problems you or family members have had with anesthetic medicines.  Any blood disorders you have.  Any surgeries you have had.  Any medical conditions you have.  Whether you are pregnant or may be pregnant. What are the risks? Generally, this is a safe procedure. However, problems may occur, including:  Allergic reactions to medicines.  A blood clot that breaks free and travels to other parts of your body.  The possible return of an abnormal heart rhythm within hours or days after the procedure.  Your heart stopping (cardiac arrest). This is rare. What happens before the procedure? Medicines  Your health care provider may have you start taking: ? Blood-thinning medicines (anticoagulants) so your blood does not clot as easily. ? Medicines to help stabilize your heart rate and rhythm.  Ask your health care provider about: ? Changing or stopping your regular medicines. This is especially important if you are taking diabetes medicines or blood thinners. ? Taking medicines such as aspirin and ibuprofen. These medicines can  thin your blood. Do not take these medicines unless your health care provider tells you to take them. ? Taking over-the-counter medicines, vitamins, herbs, and supplements. General instructions  Follow instructions from your health care provider about eating or drinking restrictions.  Plan to have someone take you home from the hospital or clinic.  If you will be going home right after the procedure, plan to have someone with you for 24 hours.  Ask your health care provider what steps will be taken to help prevent infection. These may include washing your skin with a germ-killing soap. What happens during the procedure?  An IV will be inserted into one of your veins.  Sticky patches (electrodes) or metal paddles may be placed on your chest.  You will be given a medicine to help you relax (sedative).  An electrical shock will be delivered. The procedure may vary among health care providers and hospitals.   What can I expect after the procedure?  Your blood pressure, heart rate, breathing rate, and blood oxygen level will be monitored until you leave the hospital or clinic.  Your heart rhythm will be watched to make sure it does not change.  You may have some redness on the skin where the shocks were given. Follow these instructions at home:  Do not drive for 24 hours if you were given a sedative during your procedure.  Take over-the-counter and prescription medicines only as told by your health care provider.  Ask your health care provider how to check your pulse. Check it often.  Rest for 48 hours after the procedure   or as told by your health care provider.  Avoid or limit your caffeine use as told by your health care provider.  Keep all follow-up visits as told by your health care provider. This is important. Contact a health care provider if:  You feel like your heart is beating too quickly or your pulse is not regular.  You have a serious muscle cramp that does not go  away. Get help right away if:  You have discomfort in your chest.  You are dizzy or you feel faint.  You have trouble breathing or you are short of breath.  Your speech is slurred.  You have trouble moving an arm or leg on one side of your body.  Your fingers or toes turn cold or blue. Summary  Electrical cardioversion is the delivery of a jolt of electricity to restore a normal rhythm to the heart.  This procedure may be done right away in an emergency or may be a scheduled procedure if the condition is not an emergency.  Generally, this is a safe procedure.  After the procedure, check your pulse often as told by your health care provider. This information is not intended to replace advice given to you by your health care provider. Make sure you discuss any questions you have with your health care provider. Document Revised: 09/30/2018 Document Reviewed: 09/30/2018 Elsevier Patient Education  2021 Elsevier Inc.  

## 2020-06-03 ENCOUNTER — Encounter (HOSPITAL_COMMUNITY): Payer: Self-pay | Admitting: Cardiology

## 2020-06-10 ENCOUNTER — Encounter (INDEPENDENT_AMBULATORY_CARE_PROVIDER_SITE_OTHER): Payer: Self-pay | Admitting: Family Medicine

## 2020-06-10 ENCOUNTER — Ambulatory Visit (INDEPENDENT_AMBULATORY_CARE_PROVIDER_SITE_OTHER): Payer: 59 | Admitting: Family Medicine

## 2020-06-10 ENCOUNTER — Other Ambulatory Visit: Payer: Self-pay

## 2020-06-10 VITALS — BP 103/72 | HR 71 | Temp 98.3°F | Ht 74.0 in | Wt 373.0 lb

## 2020-06-10 DIAGNOSIS — E559 Vitamin D deficiency, unspecified: Secondary | ICD-10-CM | POA: Diagnosis not present

## 2020-06-10 DIAGNOSIS — Z9189 Other specified personal risk factors, not elsewhere classified: Secondary | ICD-10-CM

## 2020-06-10 DIAGNOSIS — E8881 Metabolic syndrome: Secondary | ICD-10-CM

## 2020-06-10 DIAGNOSIS — Z6841 Body Mass Index (BMI) 40.0 and over, adult: Secondary | ICD-10-CM | POA: Diagnosis not present

## 2020-06-10 MED ORDER — VITAMIN D (ERGOCALCIFEROL) 1.25 MG (50000 UNIT) PO CAPS
50000.0000 [IU] | ORAL_CAPSULE | ORAL | 0 refills | Status: DC
Start: 2020-06-10 — End: 2022-03-01

## 2020-06-13 IMAGING — RF UPPER GI SERIES W/HIGH DENSITY WITHOUT KUB
13 of 18 series · 15 of 24 positions shown · non-contrast
Comparison: Esophagram 00069

CLINICAL DATA: Persistent dysphagia. Sensation of food sticking in
distal esophagus. Prior esophageal dilatation

EXAM:
UPPER GI SERIES WITH KUB
TECHNIQUE: After obtaining a scout radiograph a routine upper GI series was
performed using thin and high density barium.
FLUOROSCOPY TIME:  Fluoroscopy Time:  4 minutes
Radiation Exposure Index (if provided by the fluoroscopic device):
493 mGy
Seventeen spot images

[Series 1: t abdomen supine · 0.15mm/px · 1 of 1 slices shown]
[im 1/1]
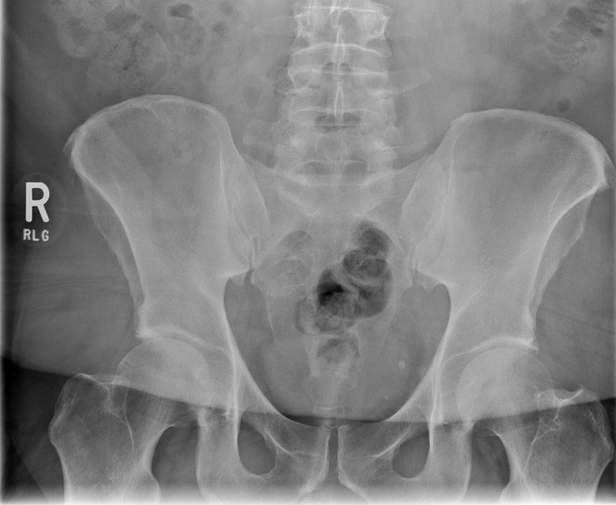

[Series 3: cp_standard · 0.34mm/px · 1 of 69 frames shown (1 of 2)]
[frame 36/69]
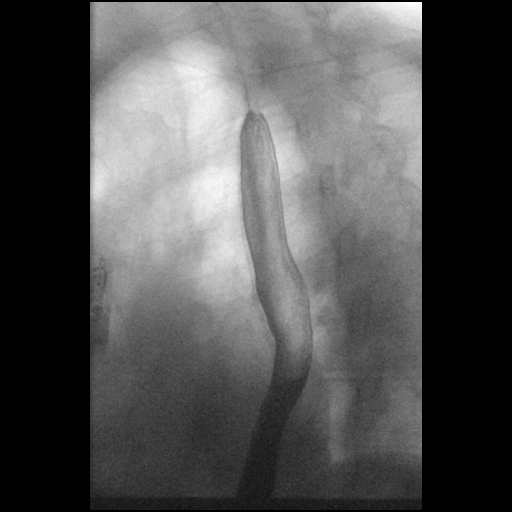

[Series 5: cp_standard · 0.34mm/px · 2 of 77 frames shown (2 of 2)]
[frame 12/77]
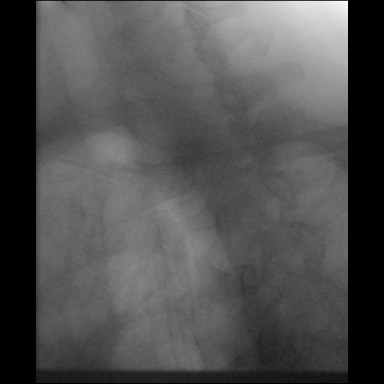
[frame 66/77]
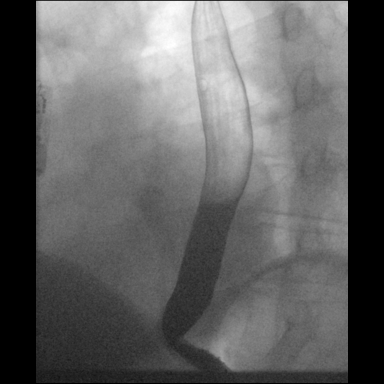

[Series 7: fluoro_barium 2fps_bw · 0.17mm/px · 1 of 6 frames shown (1 of 10)]
[frame 5/6]
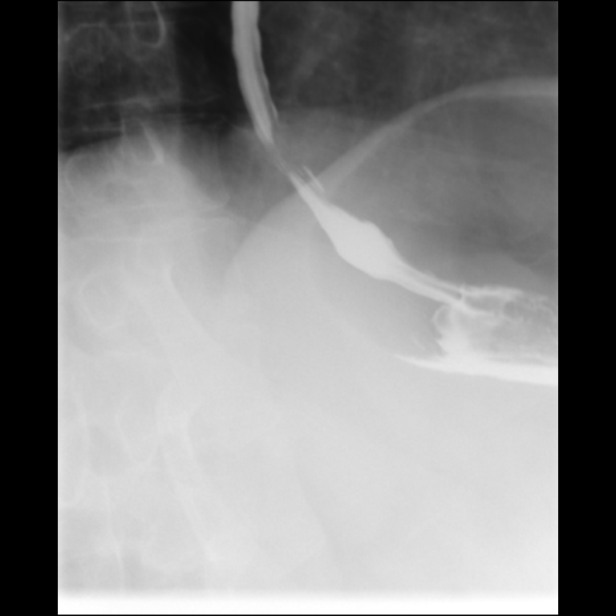

[Series 8: fluoro_barium 2fps_bw · 0.17mm/px · 1 of 2 frames shown (2 of 10)]
[frame 1/2]
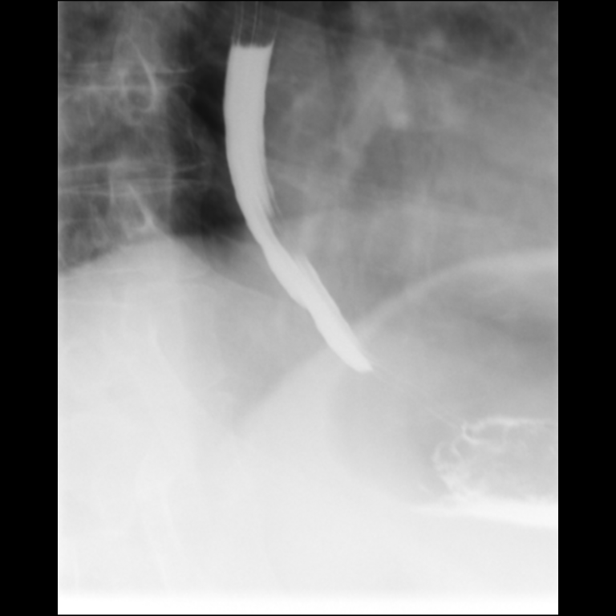

[Series 10: fluoro_barium 2fps_bw · 0.17mm/px · 1 of 3 frames shown (3 of 10)]
[frame 1/3]
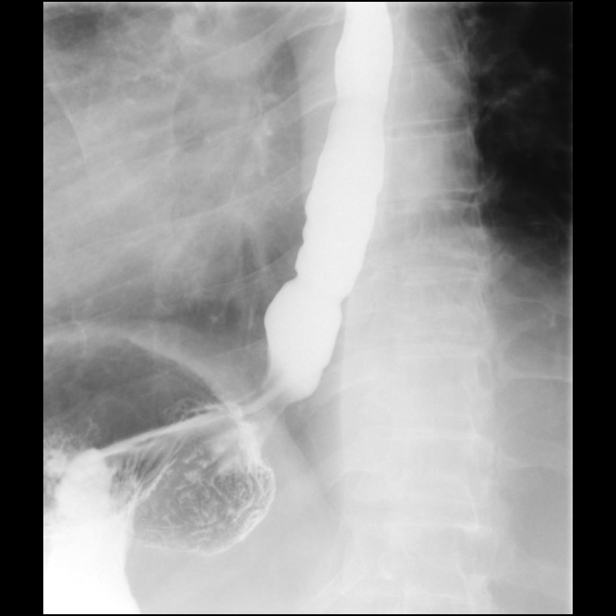

[Series 11: fluoro_barium 2fps_bw · 0.17mm/px · 2 of 3 frames shown (4 of 10)]
[frame 1/3]
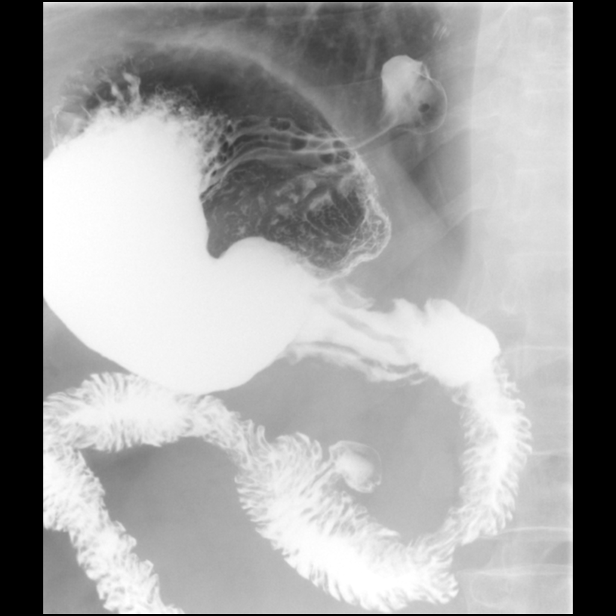
[frame 3/3]
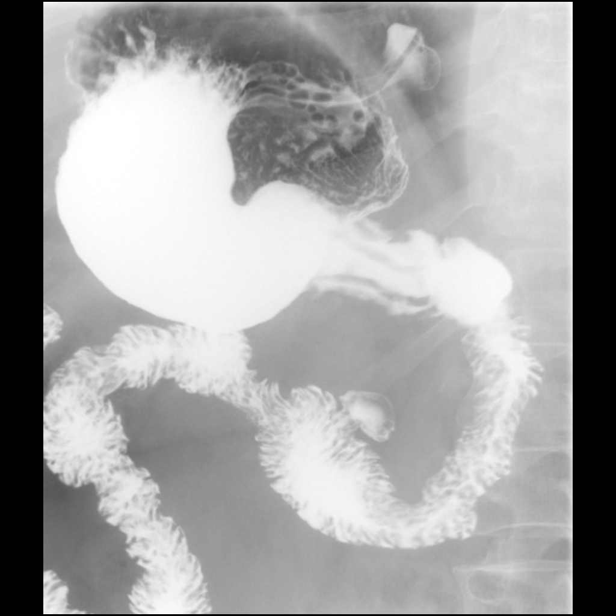

[Series 13: fluoro_barium 2fps_bw · 0.17mm/px · 1 of 2 frames shown (5 of 10)]
[frame 2/2]
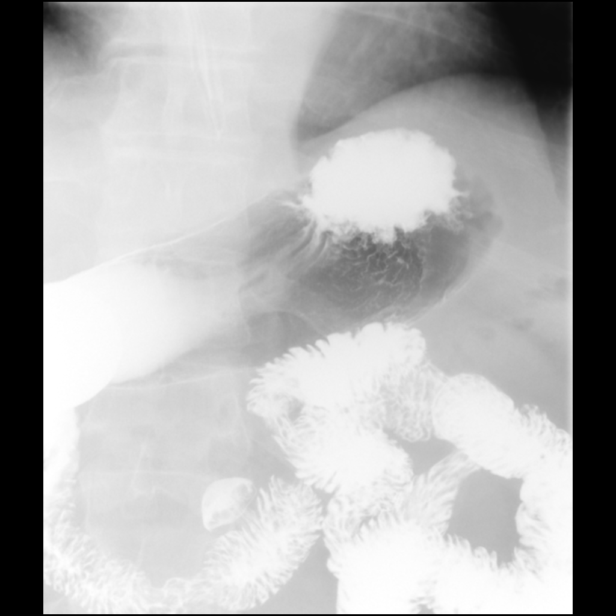

[Series 14: fluoro_barium 2fps_bw · 0.17mm/px · 1 of 2 frames shown (6 of 10)]
[frame 2/2]
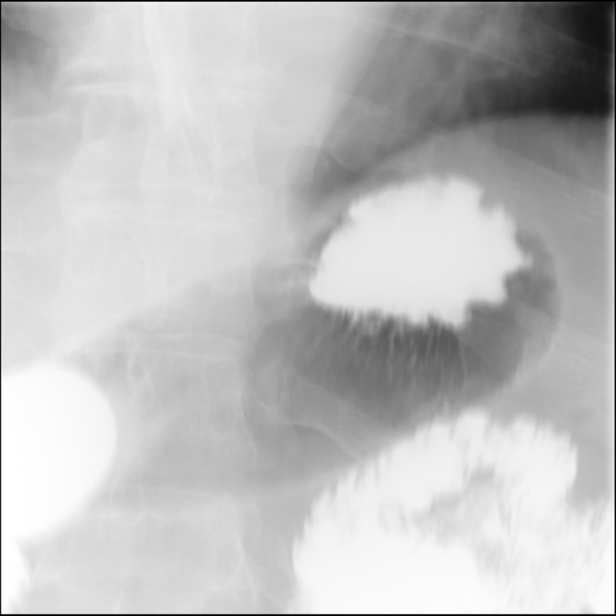

[Series 16: fluoro_barium 2fps_bw · 0.17mm/px · 1 of 2 frames shown (7 of 10)]
[frame 2/2]
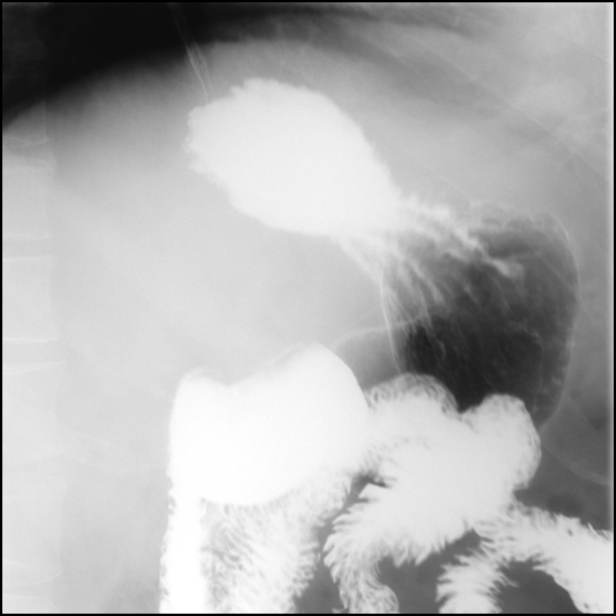

[Series 18: fluoro_barium 2fps_bw · 0.17mm/px · 1 of 1 slices shown (8 of 10)]
[im 1/1]
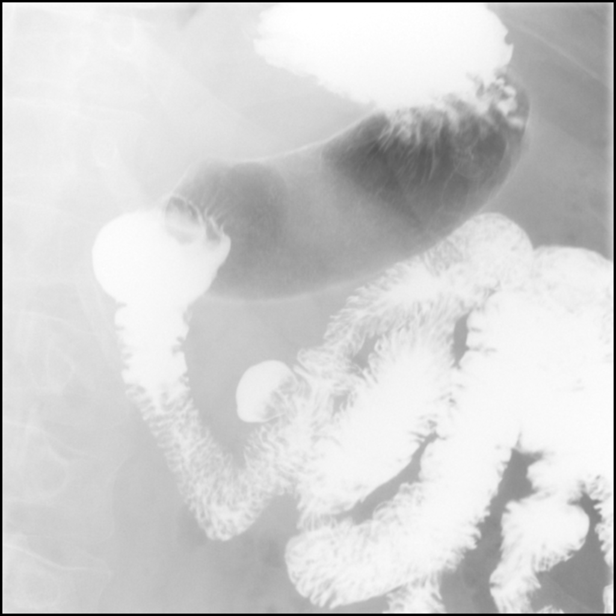

[Series 19: fluoro_barium 2fps_bw · 0.17mm/px · 1 of 2 frames shown (9 of 10)]
[frame 2/2]
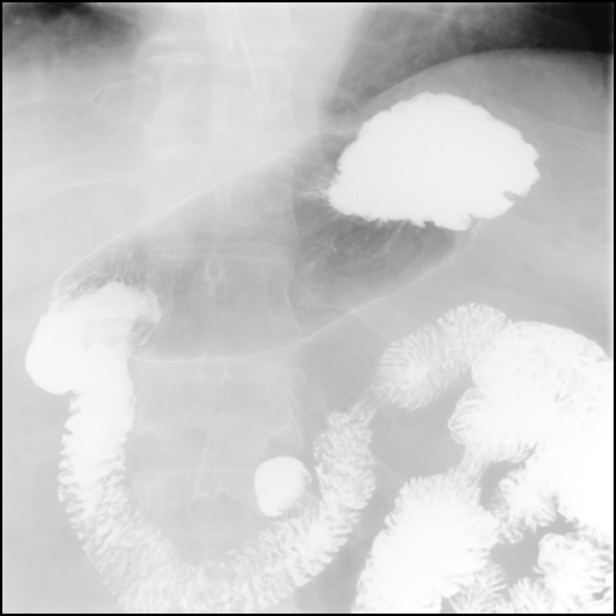

[Series 21: fluoro_barium 2fps_bw · 0.17mm/px · 1 of 3 frames shown (10 of 10)]
[frame 3/3]
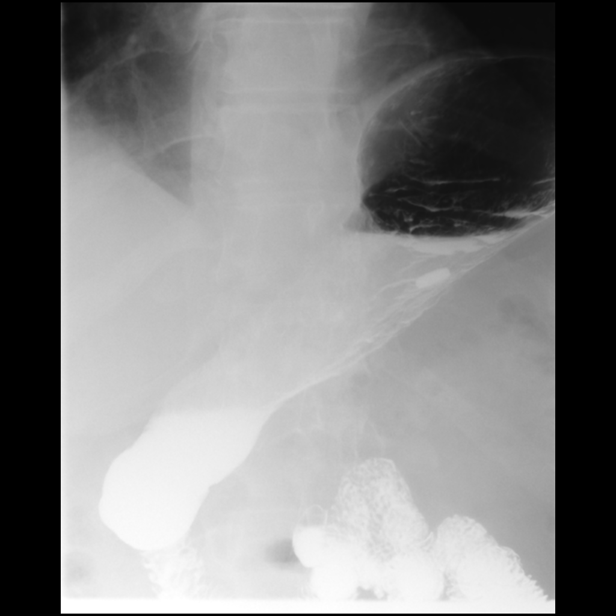

[15 of 24 positions shown; findings below may reference images not displayed]

FINDINGS: Normal swallowing function in the high cervical esophagus. No
mucosal irregularity in the mid or distal esophagus. No stricture
mass. Normal esophageal motility. Mild gastroesophageal reflux
noted. No insert small hiatal hernia.

13 mm barium tab passed GE junction easily.

Evaluation of the stomach demonstrates normal anatomy. There is
absent rugal pattern within the mid and distal gastric antrum. No
ulceration evident. No stricture mass. The duodenal bulb appears
normal.

Diverticulum noted along the third portion duodenum measuring
approximately 1.7 cm.
IMPRESSION: 1. Normal esophagram.
2. Mild gastroesophageal reflux.
3. Small hiatal hernia.
4. Absent mucosal pattern within the gastric body and antrum
suggests ATROPHIC GASTRITIS. Recommend upper endoscopy for further
evaluation.
5. Small duodenum diverticulum.

## 2020-06-14 ENCOUNTER — Ambulatory Visit: Payer: 59 | Admitting: Cardiology

## 2020-06-15 NOTE — Progress Notes (Signed)
Cardiology Office Note:    Date:  06/16/2020   ID:  Jesus Barnes, DOB 03-Aug-1970, MRN [id number]  PCP:  Dulce Sellar, NP  Cardiologist:  Norman Herrlich, MD    Referring MD: Dulce Sellar, NP    ASSESSMENT:    1. Paroxysmal atrial fibrillation (HCC)   2. Hypertensive heart disease with chronic diastolic congestive heart failure (HCC)   3. On amiodarone therapy   4. Chronic anticoagulation   5. Morbid obesity (HCC)   6. Hypotension due to drugs    PLAN:    In order of problems listed above:  1. Unfortunately back in atrial fibrillation increase amiodarone follow-up EKG 1 week continue anticoagulation. 2. I have not discontinue lisinopril with systolic less than 100   Next appointment: 6 week   Medication Adjustments/Labs and Tests Ordered: Current medicines are reviewed at length with the patient today.  Concerns regarding medicines are outlined above.  Orders Placed This Encounter  Procedures  . EKG 12-Lead   No orders of the defined types were placed in this encounter.   Chief Complaint  Patient presents with  . Follow-up  . Atrial Fibrillation    History of Present Illness:      Jesus Barnes is a 50 y.o. male with a hx of paroxysmal atrial fibrillation chronic anticoagulation hypertensive heart disease or chronic diastolic heart failure particularly when he is in atrial fibrillation and mild coronary artery disease last seen 04/26/2020 after recurrent atrial fibrillation and noncompliance having stopped dofetilide therapy.Marland Kitchen  His recent converted to sinus rhythm 06/02/2020 on amiodarone therapy Compliance with diet, lifestyle and medications: Yes  Unfortunately is back in atrial fibrillation rate controlled after decreasing amiodarone.  I placed him back on 20 mg twice daily of aspirin 1 week to let us know if he remains in atrial fibrillation.  He may require repeat cardioversion.  He is not highly symptomatic his biggest problem is fatigue he has no  edema shortness of breath chest pain palpitation or syncope.  For the first time he is committed to changing his lifestyle and weight loss. Past Medical History:  Diagnosis Date  . Anxiety 09/02/2014  . Back pain   . Burn   . Chest pain   . Chronic anticoagulation 10/16/2016  . Chronic fatigue syndrome   . Depression   . Dilated cardiomyopathy (HCC) 10/16/2016   EF 30-35%, 05/15/16 40%, 07/17/18 55-60%  . Dysphagia   . Esophageal stricture 09/02/2014  . Essential hypertension 09/01/2014  . GERD (gastroesophageal reflux disease)   . 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HCC)   . High cholesterol   . Hypertension   . Hypertension, essential 08/08/2018  . Hypertensive heart disease 09/01/2014  . Joint pain   . Mild CAD 10/16/2016  . Morbid obesity with body mass index (BMI) of 45.0 to 49.9 in adult [facility]) 04/05/2016  . Obstructive sleep apnea    awaiting treatment  . On amiodarone therapy 10/16/2016  . Palpitation   . Paroxysmal atrial fibrillation (HCC) 12/12/2016  . Persistent atrial fibrillation (HCC) 09/01/2014   Overview:  CHADS2=1  . Sleep apnea   . SOB (shortness of breath)     Past Surgical History:  Procedure Laterality Date  . APPENDECTOMY    . CARDIOVERSION    . CARDIOVERSION N/A 12/14/2016   Procedure: CARDIOVERSION;  Surgeon: Marinus Maw, MD;  Location: [facility] INVASIVE CV LAB;  Service: Cardiovascular;  Laterality: N/A;  . CARDIOVERSION N/A 08/15/2018   Procedure: CARDIOVERSION;  Surgeon: Thurmon Fair, MD;  Location: [facility]  ENDOSCOPY;  Service: Cardiovascular;  Laterality: N/A;  . CARDIOVERSION N/A 04/30/2020   Procedure: CARDIOVERSION;  Surgeon: Thurmon Fair, MD;  Location: [facility] ENDOSCOPY;  Service: Cardiovascular;  Laterality: N/A;  . CARDIOVERSION N/A 06/02/2020   Procedure: CARDIOVERSION (CATH LAB);  Surgeon: Regan Lemming, MD;  Location: [facility] INVASIVE CV LAB;  Service: Cardiovascular;  Laterality: N/A;  . COLONOSCOPY  06/18/2001    Bx: Tubulovillous adenoma. Done by Dr. Charm Barges. He was told to  come back 24yr later, but pt didnot.   . ESOPHAGOGASTRODUODENOSCOPY  12/17/2014   mild gastritis. Small antral polyps (likely inflammatory). Status post empiric esophageal dilatation  . HERNIA REPAIR     umblical  . KNEE SURGERY    . SKIN GRAFT    . URETHRA SURGERY      Current Medications: Current Meds  Medication Sig  . amiodarone (PACERONE) 200 MG tablet Take 1 tablet (200 mg total) by mouth daily.  Marland Kitchen atorvastatin (LIPITOR) 10 MG tablet Take 10 mg by mouth daily.  Marland Kitchen buPROPion (WELLBUTRIN XL) 150 MG 24 hr tablet Take 450 mg by mouth daily.  . clonazePAM (KLONOPIN) 1 MG tablet Take 1 mg by mouth every evening.  . diltiazem (CARDIZEM) 30 MG tablet Take 1 tablet (30 mg total) by mouth 4 (four) times daily as needed (for elevated heart rates greater than 110).  . furosemide (LASIX) 40 MG tablet Take 40 mg by mouth daily.  . Misc Natural Products (APPLE CIDER VINEGAR DIET PO) Take 2 tablets by mouth in the morning and at bedtime.  . nitroGLYCERIN (NITROSTAT) 0.4 MG SL tablet Place 0.4 mg under the tongue every 5 (five) minutes as needed for chest pain.  . pantoprazole (PROTONIX) 40 MG tablet Take 40 mg by mouth daily.  . rivaroxaban (XARELTO) 20 MG TABS tablet Take 20 mg by mouth daily with supper.  . Vitamin D, Ergocalciferol, (DRISDOL) 1.25 MG (50000 UNIT) CAPS capsule Take 1 capsule (50,000 Units total) by mouth every 7 (seven) days.  . [DISCONTINUED] lisinopril (ZESTRIL) 10 MG tablet Take 10 mg by mouth daily.      Allergies:   Patient has no known allergies.   Social History   Socioeconomic History  . Marital status: Married    Spouse name: Not on file  . Number of children: 1  . Years of education: Not on file  . Highest education level: Not on file  Occupational History  . Occupation: Truck [employer] doctor  Tobacco Use  . Smoking status: Former Smoker    Types: Cigars  . Smokeless tobacco: Never Used  . Tobacco comment: quit 7 years ago  Vaping Use  . Vaping Use: Never used   Substance and Sexual Activity  . Alcohol use: Yes    Comment: ocassionally  . Drug use: No  . Sexual activity: Not on file  Other Topics Concern  . 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 There is no history of Colon cancer or Esophageal cancer. ROS:   Please see the history  of present illness.    All other systems reviewed and are negative.  EKGs/Labs/Other Studies Reviewed:    The following studies were reviewed today:  EKG:  EKG ordered today and personally reviewed.  The ekg ordered today demonstrates atrial fibrillation controlled rate  Recent Labs: 04/26/2020: NT-Pro BNP 1,421 05/27/2020: ALT 24; BUN 17; Creatinine, Ser 0.93; Hemoglobin 15.8; Platelets 306; Potassium 4.4; Sodium 141; TSH 4.400  Recent Lipid Panel    Component Value Date/Time   CHOL 165 05/27/2020 1209   TRIG 105 05/27/2020 1209   HDL 52 05/27/2020 1209   LDLCALC 94 05/27/2020 1209    Physical Exam:    VS:  BP 96/74 (BP Location: Right Arm, Patient Position: Sitting)   Pulse 79   Ht 6' 2.5" (1.892 m)   Wt (!) 380 lb 6.4 oz (172.5 kg)   SpO2 96%   BMI 48.19 kg/m     Wt Readings from Last 3 Encounters:  06/16/20 (!) 380 lb 6.4 oz (172.5 kg)  06/10/20 (!) 373 lb (169.2 kg)  05/27/20 (!) 373 lb (169.2 kg)     GEN: Obese Well nourished, well developed in no acute distress HEENT: Normal NECK: No JVD; No carotid bruits LYMPHATICS: No lymphadenopathy CARDIAC: RRR, no murmurs, rubs, gallops RESPIRATORY:  Clear to auscultation  without rales, wheezing or rhonchi  ABDOMEN: Soft, non-tender, non-distended MUSCULOSKELETAL:  No edema; No deformity  SKIN: Warm and dry NEUROLOGIC:  Alert and oriented x 3 PSYCHIATRIC:  Normal affect    Signed, Norman Herrlich, MD  06/16/2020 5:15 PM    [facility]

## 2020-06-16 ENCOUNTER — Other Ambulatory Visit: Payer: Self-pay

## 2020-06-16 ENCOUNTER — Encounter: Payer: Self-pay | Admitting: Cardiology

## 2020-06-16 ENCOUNTER — Ambulatory Visit (INDEPENDENT_AMBULATORY_CARE_PROVIDER_SITE_OTHER): Payer: 59 | Admitting: Cardiology

## 2020-06-16 VITALS — BP 96/74 | HR 79 | Ht 74.5 in | Wt 380.4 lb

## 2020-06-16 DIAGNOSIS — I480 Paroxysmal atrial fibrillation: Secondary | ICD-10-CM | POA: Diagnosis not present

## 2020-06-16 DIAGNOSIS — I110 Hypertensive heart disease with heart failure: Secondary | ICD-10-CM | POA: Diagnosis not present

## 2020-06-16 DIAGNOSIS — Z7901 Long term (current) use of anticoagulants: Secondary | ICD-10-CM

## 2020-06-16 DIAGNOSIS — I5032 Chronic diastolic (congestive) heart failure: Secondary | ICD-10-CM

## 2020-06-16 DIAGNOSIS — I952 Hypotension due to drugs: Secondary | ICD-10-CM

## 2020-06-16 DIAGNOSIS — Z79899 Other long term (current) drug therapy: Secondary | ICD-10-CM

## 2020-06-16 NOTE — Patient Instructions (Addendum)
Medication Instructions:  Your physician has recommended you make the following change in your medication:  INCREASE: Amiodarone 400 mg take by mouth twice daily for one week then resume your normal dose.  STOP: Lisinopril *If you need a refill on your cardiac medications before your next appointment, please call your pharmacy*   Lab Work: None If you have labs (blood work) drawn today and your tests are completely normal, you will receive your results only by: Marland Kitchen MyChart Message (if you have MyChart) OR . A paper copy in the mail If you have any lab test that is abnormal or we need to change your treatment, we will call you to review the results.   Testing/Procedures: None   Follow-Up: At [facility], you and your health needs are our priority.  As part of our continuing mission to provide you with exceptional heart care, we have created designated Provider Care Teams.  These Care Teams include your primary Cardiologist (physician) and Advanced Practice Providers (APPs -  Physician Assistants and Nurse Practitioners) who all work together to provide you with the care you need, when you need it.  We recommend signing up for the patient portal called "MyChart".  Sign up information is provided on this After Visit Summary.  MyChart is used to connect with patients for Virtual Visits (Telemedicine).  Patients are able to view lab/test results, encounter notes, upcoming appointments, etc.  Non-urgent messages can be sent to your provider as well.   To learn more about what you can do with MyChart, go to [email or web address].    Your next appointment:   6 week(s)  The format for your next appointment:   In Person  Provider:   Norman Herrlich, MD   Other Instructions [email or web address]        FDA-cleared, clinical grade mobile EKG monitor: Lourena Simmonds is the most clinically-validated mobile EKG used by the world's leading cardiac care  medical professionals With Basic service, know instantly if your heart rhythm is normal or if atrial fibrillation is detected, and email the last single EKG recording to yourself or your doctor Premium service, available for purchase through the Kardia app for $9.99 per month or $99 per year, includes unlimited history and storage of your EKG recordings, a monthly EKG summary report to share with your doctor, along with the ability to track your blood pressure, activity and weight Includes one KardiaMobile phone clip FREE SHIPPING: Standard delivery 1-3 business days. Orders placed by 11:00am PST will ship that afternoon. Otherwise, will ship next business day. All orders ship via [employer] from [place], [place]

## 2020-06-17 NOTE — Progress Notes (Signed)
Chief Complaint:   OBESITY Jesus Barnes is here to discuss his progress with his obesity treatment plan along with follow-up of his obesity related diagnoses. Jesus Barnes is on the Category 4 Plan + 200 and states he is following his eating plan approximately 40% of the time. Jesus Barnes states he is not currently exercising.  Today's visit was #: 2 Starting weight: 373 lbs Starting date: 05/27/2020 Today's weight: 373 lbs Today's date: 06/10/2020 Total lbs lost to date: 0 Total lbs lost since last in-office visit: 0  Interim History: Jesus Barnes found the plan to be difficult to follow due to work schedule and travel. Yesterday he stopped at [facility] for most meals. Her broke lunch up into two 2 oz wraps. He can't fix meals while on the road. He signed up for [facility]. Pt has been doing some shakes. He wants to know if there is a way for him to incorporate up to 4 shakes per day.  Subjective:   1. Vitamin D deficiency New. Discussed labs with patient today. Jesus Barnes denies nausea, vomiting, and muscle weakness but notes fatigue. He is not on Vit D supplementation. She has a Vit D level of 26.2.   Ref. Range 05/27/2020 12:09  Vitamin D, 25-Hydroxy Latest Ref Range: 30.0 - 100.0 ng/mL 26.2 (L)   2. Insulin resistance New. Discussed labs with patient today. Jesus Barnes has an insulin level of 15.3 and A1c 5.4. He is not on medication. He reports cravings for carbs.  Lab Results  Component Value Date   INSULIN 15.3 05/27/2020   Lab Results  Component Value Date   HGBA1C 5.4 05/27/2020   3. At risk for diabetes mellitus Jesus Barnes is at higher than average risk for developing diabetes due to obesity.   Assessment/Plan:   1. Vitamin D deficiency Low Vitamin D level contributes to fatigue and are associated with obesity, breast, and colon cancer. He agrees to start to take prescription Vitamin D @50 ,000 IU every week and will follow-up for routine testing of Vitamin D, at least 2-3 times per year to avoid over-replacement.  -  Vitamin D, Ergocalciferol, (DRISDOL) 1.25 MG (50000 UNIT) CAPS capsule; Take 1 capsule (50,000 Units total) by mouth every 7 (seven) days.  Dispense: 4 capsule; Refill: 0  2. Insulin resistance Jesus Barnes will continue to work on weight loss, exercise, and decreasing simple carbohydrates to help decrease the risk of diabetes. Jesus Barnes agreed to follow-up with Annette Stable as directed to closely monitor his progress. Jesus Barnes is to try journaling. Follow up in 3 months. No change in management. If cravings increase or weight stalls, consider medication.  3. At risk for diabetes mellitus Jesus Barnes was given approximately 15 minutes of diabetes education and counseling today. We discussed intensive lifestyle modifications today with an emphasis on weight loss as well as increasing exercise and decreasing simple carbohydrates in his diet. We also reviewed medication options with an emphasis on risk versus benefit of those discussed.   Repetitive spaced learning was employed today to elicit superior memory formation and behavioral change.  4. Class 3 severe obesity with serious comorbidity and body mass index (BMI) of 45.0 to 49.9 in adult, unspecified obesity type (HCC) Jesus Barnes is currently in the action stage of change. As such, his goal is to continue with weight loss efforts. He has agreed to keeping a food journal and adhering to recommended goals of 2100-2300 calories and 130+ g protein.   Exercise goals:  As is  Behavioral modification strategies: increasing lean protein intake, meal planning  and cooking strategies, keeping healthy foods in the home and planning for success.  Jesus Barnes has agreed to follow-up with our clinic in 2 weeks. He was informed of the importance of frequent follow-up visits to maximize his success with intensive lifestyle modifications for his multiple health conditions.   Objective:   Blood pressure 103/72, pulse 71, temperature 98.3 F (36.8 C), height 6\' 2"  (1.88 m), weight (!) 373 lb (169.2 kg), SpO2  98 %. Body mass index is 47.89 kg/m.  General: Cooperative, alert, well developed, in no acute distress. HEENT: Conjunctivae and lids unremarkable. Cardiovascular: Regular rhythm.  Lungs: Normal work of breathing. Neurologic: No focal deficits.   Lab Results  Component Value Date   CREATININE 0.93 05/27/2020   BUN 17 05/27/2020   NA 141 05/27/2020   K 4.4 05/27/2020   CL 100 05/27/2020   CO2 21 05/27/2020   Lab Results  Component Value Date   ALT 24 05/27/2020   AST 32 05/27/2020   ALKPHOS 74 05/27/2020   BILITOT 0.4 05/27/2020   Lab Results  Component Value Date   HGBA1C 5.4 05/27/2020   Lab Results  Component Value Date   INSULIN 15.3 05/27/2020   Lab Results  Component Value Date   TSH 4.400 05/27/2020   Lab Results  Component Value Date   CHOL 165 05/27/2020   HDL 52 05/27/2020   LDLCALC 94 05/27/2020   TRIG 105 05/27/2020   Lab Results  Component Value Date   WBC 5.2 05/27/2020   HGB 15.8 05/27/2020   HCT 47.3 05/27/2020   MCV 89 05/27/2020   PLT 306 05/27/2020    Attestation Statements:   Reviewed by clinician on day of visit: allergies, medications, problem list, medical history, surgical history, family history, social history, and previous encounter notes.  05/29/2020, am acting as transcriptionist for Jesus Hilda, MD.   I have reviewed the above documentation for accuracy and completeness, and I agree with the above. - Reuben Likes, MD

## 2020-06-21 ENCOUNTER — Telehealth: Payer: Self-pay | Admitting: Cardiology

## 2020-06-21 ENCOUNTER — Other Ambulatory Visit: Payer: Self-pay | Admitting: Cardiology

## 2020-06-21 MED ORDER — AMIODARONE HCL 200 MG PO TABS: 200.0000 mg | ORAL_TABLET | Freq: Every day | ORAL | 1 refills | Status: DC

## 2020-06-21 NOTE — Telephone Encounter (Signed)
Lasix 40 mg tablet refill to pharmacy

## 2020-06-21 NOTE — Telephone Encounter (Signed)
Pt c/o medication issue:  1. Name of Medication: amiodarone (PACERONE) 200 MG tablet  2. How are you currently taking this medication (dosage and times per day)? Pt was asked by Dr. Dulce Sellar to take 400mg  per day for 1 week  3. Are you having a reaction (difficulty breathing--STAT)? No  4. What is your medication issue? Dr. asked pt to take 200mg  in the morning and 200mg  in the evening for 1 week, pt is currently not  in Afib, Pharmacy is stating prescription is not written for 400mg  per day and will need another script sent to pharmacy    *STAT* If patient is at the pharmacy, call can be transferred to refill team.   1. Which medications need to be refilled? (please list name of each medication and dose if known) amiodarone (PACERONE) 200 MG tablet  2. Which pharmacy/location (including street and city if local pharmacy) is medication to be sent to? [facility] - [place], [place] - [address]  3. Do they need a 30 day or 90 day supply? 90 day supply if possible  Pt is completely out of this medicine because he had to double up on taking the meds.

## 2020-06-21 NOTE — Telephone Encounter (Signed)
He was taking 200 twice daily  I increased transiently I would send the prescription is 200 twice daily 180 tablets.

## 2020-06-21 NOTE — Telephone Encounter (Signed)
    Pt c/o medication issue:  1. Name of Medication: amiodarone (PACERONE) 200 MG tablet  2. How are you currently taking this medication (dosage and times per day)? Take 1 tablet (200 mg total) by mouth daily.  3. Are you having a reaction (difficulty breathing--STAT)?   4. What is your medication issue? Pt received notification that his prescription is ready however, refill only for 90 tablets, he said Dr. Dulce Sellar advised him to take it twice a day and that is working to control is Afib, he wanted to continue taking Amiodarone 200 mg twice a day.

## 2020-06-21 NOTE — Telephone Encounter (Signed)
Spoke to the patient just now and he let me know that he was out of his amiodarone. I sent in a refill for him just now.    Encouraged patient to call back with any questions or concerns.

## 2020-06-22 MED ORDER — AMIODARONE HCL 200 MG PO TABS: 200.0000 mg | ORAL_TABLET | Freq: Two times a day (BID) | ORAL | 0 refills | Status: DC

## 2020-06-22 NOTE — Addendum Note (Signed)
Addended by: Hazle Quant on: 06/22/2020 08:22 AM   Modules accepted: Orders

## 2020-06-22 NOTE — Telephone Encounter (Signed)
Called patient. Informed him per Dr. Dulce Sellar that he agrees for patient to take amiodarone 200 mg twice daily. Sent in correct RX.

## 2020-06-30 ENCOUNTER — Encounter (INDEPENDENT_AMBULATORY_CARE_PROVIDER_SITE_OTHER): Payer: Self-pay

## 2020-06-30 ENCOUNTER — Ambulatory Visit (INDEPENDENT_AMBULATORY_CARE_PROVIDER_SITE_OTHER): Payer: 59 | Admitting: Family Medicine

## 2020-07-06 ENCOUNTER — Ambulatory Visit (HOSPITAL_BASED_OUTPATIENT_CLINIC_OR_DEPARTMENT_OTHER): Payer: 59 | Attending: Cardiology | Admitting: Cardiology

## 2020-07-13 ENCOUNTER — Ambulatory Visit (INDEPENDENT_AMBULATORY_CARE_PROVIDER_SITE_OTHER): Payer: 59 | Admitting: Bariatrics

## 2020-07-16 DIAGNOSIS — G9332 Myalgic encephalomyelitis/chronic fatigue syndrome: Secondary | ICD-10-CM | POA: Insufficient documentation

## 2020-07-16 DIAGNOSIS — M549 Dorsalgia, unspecified: Secondary | ICD-10-CM | POA: Insufficient documentation

## 2020-07-16 DIAGNOSIS — E7800 Pure hypercholesterolemia, unspecified: Secondary | ICD-10-CM | POA: Insufficient documentation

## 2020-07-16 DIAGNOSIS — G4730 Sleep apnea, unspecified: Secondary | ICD-10-CM | POA: Insufficient documentation

## 2020-07-16 DIAGNOSIS — M2550 Pain in unspecified joint: Secondary | ICD-10-CM | POA: Insufficient documentation

## 2020-07-16 DIAGNOSIS — F32A Depression, unspecified: Secondary | ICD-10-CM | POA: Insufficient documentation

## 2020-07-16 DIAGNOSIS — R079 Chest pain, unspecified: Secondary | ICD-10-CM | POA: Insufficient documentation

## 2020-07-16 DIAGNOSIS — R0602 Shortness of breath: Secondary | ICD-10-CM | POA: Insufficient documentation

## 2020-07-16 DIAGNOSIS — R5382 Chronic fatigue, unspecified: Secondary | ICD-10-CM | POA: Insufficient documentation

## 2020-07-19 ENCOUNTER — Other Ambulatory Visit: Payer: Self-pay | Admitting: Cardiology

## 2020-07-19 ENCOUNTER — Telehealth: Payer: Self-pay | Admitting: Cardiology

## 2020-07-19 ENCOUNTER — Other Ambulatory Visit (INDEPENDENT_AMBULATORY_CARE_PROVIDER_SITE_OTHER): Payer: Self-pay | Admitting: Family Medicine

## 2020-07-19 DIAGNOSIS — E559 Vitamin D deficiency, unspecified: Secondary | ICD-10-CM

## 2020-07-19 NOTE — Telephone Encounter (Signed)
Prescription refill request for Xarelto received.  Indication: atrial fib Last office visit:4/22  [place] Weight:172.5 kg Age:50 Scr:0.93 3/22 CrCl: 234.26 ml/min  Prescription refilled

## 2020-07-19 NOTE — Telephone Encounter (Signed)
Dr.Ukleja 

## 2020-07-19 NOTE — Telephone Encounter (Signed)
Patient was a No Show to his sleep study. He states that he has two machines at home but cannot use them because of the mask. He feels like having a sleep study would be a waste of time but he does not want [place] thinking that he is not trying to do better. He wants to know what other options are out there for him to try as far as masks and mouth pieces go before he follows through with the sleep study. Please advise.

## 2020-07-22 ENCOUNTER — Encounter (INDEPENDENT_AMBULATORY_CARE_PROVIDER_SITE_OTHER): Payer: Self-pay

## 2020-07-22 ENCOUNTER — Ambulatory Visit (INDEPENDENT_AMBULATORY_CARE_PROVIDER_SITE_OTHER): Payer: 59 | Admitting: Adult Health

## 2020-08-09 NOTE — Progress Notes (Deleted)
Cardiology Office Note:    Date:  08/09/2020   ID:  Jesus Barnes, DOB 09/17/1970, MRN [id number]  PCP:  Dulce Sellar, NP  Cardiologist:  Norman Herrlich, MD    Referring MD: Dulce Sellar, NP    ASSESSMENT:    No diagnosis found. PLAN:    In order of problems listed above:  1. ***   Next appointment: ***   Medication Adjustments/Labs and Tests Ordered: Current medicines are reviewed at length with the patient today.  Concerns regarding medicines are outlined above.  No orders of the defined types were placed in this encounter.  No orders of the defined types were placed in this encounter.   No chief complaint on file.   History of Present Illness:    Jesus Barnes is a 50 y.o. male with a hx of paroxysmal atrial fibrillation chronic anticoagulation hypertensive heart disease with chronic diastolic heart failure and mild CAD last seen 06/16/2020 in atrial fibrillation on amiodarone. Compliance with diet, lifestyle and medications: *** Past Medical History:  Diagnosis Date  . Anxiety 09/02/2014  . Back pain   . Burn   . Chest pain   . Chronic anticoagulation 10/16/2016  . Chronic fatigue syndrome   . Depression   . Dilated cardiomyopathy (HCC) 10/16/2016   EF 30-35%, 05/15/16 40%, 07/17/18 55-60%  . Dysphagia   . Esophageal stricture 09/02/2014  . Essential hypertension 09/01/2014  . GERD (gastroesophageal reflux disease)   . 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HCC)   . High cholesterol   . Hypertension   . Hypertension, essential 08/08/2018  . Hypertensive heart disease 09/01/2014  . Joint pain   . Mild CAD 10/16/2016  . Morbid obesity with body mass index (BMI) of 45.0 to 49.9 in adult [facility]) 04/05/2016  . Obstructive sleep apnea    awaiting treatment  . On amiodarone therapy 10/16/2016  . Palpitation   . Paroxysmal atrial fibrillation (HCC) 12/12/2016  . Persistent atrial fibrillation (HCC) 09/01/2014   Overview:  CHADS2=1  . Sleep apnea   . SOB (shortness of breath)      Past Surgical History:  Procedure Laterality Date  . APPENDECTOMY    . CARDIOVERSION    . CARDIOVERSION N/A 12/14/2016   Procedure: CARDIOVERSION;  Surgeon: Marinus Maw, MD;  Location: [facility] INVASIVE CV LAB;  Service: Cardiovascular;  Laterality: N/A;  . CARDIOVERSION N/A 08/15/2018   Procedure: CARDIOVERSION;  Surgeon: Thurmon Fair, MD;  Location: [facility] ENDOSCOPY;  Service: Cardiovascular;  Laterality: N/A;  . CARDIOVERSION N/A 04/30/2020   Procedure: CARDIOVERSION;  Surgeon: Thurmon Fair, MD;  Location: [facility]OS[facility];  Service: Cardiovascular;  Laterality: N/A;  . CARDIOVERSION N/A 06/02/2020   Procedure: CARDIOVERSION (CATH LAB);  Surgeon: Regan Lemming, MD;  Location[facility] INVASIVE CV LAB;  Service: Cardiovascular;  Laterality: N/A;  . COLONOSCOPY  06/18/2001    Bx: Tubulovillous adenoma. Done by Dr. Charm Barges. He was told to come back 62yr later, but pt didnot.   . ESOPHAGOGASTRODUODENOSCOPY  12/17/2014   mild gastritis. Small antral polyps (likely inflammatory). Status post empiric esophageal dilatation  . HERNIA REPAIR     umblical  . KNEE SURGERY    . SKIN GRAFT    . URETHRA SURGERY      Current Medications: No outpatient medications have been marked as taking for the 08/10/20 encounter (Appointment) with Baldo Daub, MD.     Allergies:   Patient has no known allergies.   Social History   Socioeconomic History  . Marital status: Married  Spouse name: Not on file  . Number of children: 1  . Years of education: Not on file  . Highest education level: Not on file  Occupational History  . Occupation: Truck [employer] doctor  Tobacco Use  . Smoking status: Former Smoker    Types: Cigars  . Smokeless tobacco: Never Used  . Tobacco comment: quit 7 years ago  Vaping Use  . Vaping Use: Never used  Substance and Sexual Activity  . Alcohol use: Yes    Comment: ocassionally  . Drug use: No  . Sexual activity: Not on file  Other Topics Concern  . 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 There is no history of Colon cancer or Esophageal cancer. ROS:   Please see the history of present illness.    All other systems reviewed and are negative.  EKGs/Labs/Other Studies Reviewed:    The following studies were reviewed today:  EKG:  EKG ordered today and personally reviewed.  The ekg ordered today demonstrates ***  Recent Labs: 04/26/2020: NT-Pro BNP 1,421 05/27/2020: ALT 24; BUN 17; Creatinine, Ser 0.93; Hemoglobin 15.8; Platelets 306; Potassium 4.4; Sodium 141; TSH 4.400  Recent Lipid Panel    Component Value Date/Time   CHOL 165 05/27/2020 1209   TRIG 105 05/27/2020 1209   HDL 52 05/27/2020 1209   LDLCALC 94 05/27/2020 1209    Physical Exam:    VS:  There were no vitals taken for this visit.    Wt Readings from Last 3 Encounters:  06/16/20 (!) 380 lb 6.4 oz (172.5 kg)  06/10/20 (!) 373 lb (169.2 kg)  05/27/20 (!) 373 lb (169.2 kg)     GEN: *** Well nourished, well developed in no acute distress HEENT: Normal NECK: No JVD; No carotid bruits LYMPHATICS: No lymphadenopathy CARDIAC: ***RRR, no murmurs, rubs, gallops RESPIRATORY:  Clear to auscultation without rales, wheezing or rhonchi  ABDOMEN: Soft, non-tender, non-distended MUSCULOSKELETAL:  No edema; No deformity  SKIN: Warm and dry NEUROLOGIC:  Alert and oriented x 3 PSYCHIATRIC:  Normal affect    Signed, Norman Herrlich, MD  08/09/2020 8:07 PM    [facility]

## 2020-08-10 ENCOUNTER — Ambulatory Visit: Payer: 59 | Admitting: Cardiology

## 2020-08-17 ENCOUNTER — Other Ambulatory Visit (INDEPENDENT_AMBULATORY_CARE_PROVIDER_SITE_OTHER): Payer: Self-pay | Admitting: Family Medicine

## 2020-08-17 DIAGNOSIS — E559 Vitamin D deficiency, unspecified: Secondary | ICD-10-CM

## 2020-08-17 NOTE — Telephone Encounter (Signed)
Dr.Ukleja 

## 2020-08-30 ENCOUNTER — Other Ambulatory Visit: Payer: Self-pay

## 2020-08-30 ENCOUNTER — Ambulatory Visit (INDEPENDENT_AMBULATORY_CARE_PROVIDER_SITE_OTHER): Payer: 59 | Admitting: Cardiology

## 2020-08-30 ENCOUNTER — Encounter: Payer: Self-pay | Admitting: Cardiology

## 2020-08-30 VITALS — BP 118/78 | HR 73 | Ht 75.0 in | Wt 387.0 lb

## 2020-08-30 DIAGNOSIS — I4819 Other persistent atrial fibrillation: Secondary | ICD-10-CM | POA: Diagnosis not present

## 2020-08-30 NOTE — Progress Notes (Signed)
Electrophysiology Office Note   Date:  08/30/2020   ID:  Jesus Barnes, DOB February 17, 1971, MRN [id number]  PCP:  Dulce Sellar, NP  Cardiologist:  Dulce Sellar Primary Electrophysiologist:  Jacklin Zwick Jorja Loa, MD    Chief Complaint: AF   History of Present Illness: Jesus Barnes is a 50 y.o. male who is being seen today for the evaluation of AF at the request of Dulce Sellar, NP. Presenting today for electrophysiology evaluation.  He has a history significant for atrial fibrillation, hypertension, coronary artery disease, morbid obesity, and sleep apnea.  He has had multiple cardioversions in the past.  He had previously been on dofetilide.  Unfortunately went back into atrial fibrillation.  He initially developed a tachycardia mediated cardiomyopathy but this has since improved.  He has a BMI of 48.  His dofetilide was stopped as he went back into atrial fibrillation.  He was loaded on amiodarone status post cardioversion 06/02/2020.  Today, denies symptoms of palpitations, chest pain, shortness of breath, orthopnea, PND, lower extremity edema, claudication, dizziness, presyncope, syncope, bleeding, or neurologic sequela. The patient is tolerating medications without difficulties.  Since his cardioversion he is unfortunately back into atrial fibrillation.  He feels better now that his rate is better controlled.  He has no chest pain.  He has minimal shortness of breath and fatigue.  Past Medical History:  Diagnosis Date   Anxiety 09/02/2014   Back pain    Burn    Chest pain    Chronic anticoagulation 10/16/2016   Chronic fatigue syndrome    Depression    Dilated cardiomyopathy (HCC) 10/16/2016   EF 30-35%, 05/15/16 40%, 07/17/18 55-60%   Dysphagia    Esophageal stricture 09/02/2014   Essential hypertension 09/01/2014   GERD (gastroesophageal reflux disease)    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HCC)    High cholesterol    Hypertension    Hypertension, essential 08/08/2018   Hypertensive heart  disease 09/01/2014   Joint pain    Mild CAD 10/16/2016   Morbid obesity with body mass index (BMI) of 45.0 to 49.9 in adult [facility]) 04/05/2016   Obstructive sleep apnea    awaiting treatment   On amiodarone therapy 10/16/2016   Palpitation    Paroxysmal atrial fibrillation (HCC) 12/12/2016   Persistent atrial fibrillation (HCC) 09/01/2014   Overview:  CHADS2=1   Sleep apnea    SOB (shortness of breath)    Past Surgical History:  Procedure Laterality Date   APPENDECTOMY     CARDIOVERSION     CARDIOVERSION N/A 12/14/2016   Procedure: CARDIOVERSION;  Surgeon: Marinus Maw, MD;  Location: [facility] INVASIVE CV LAB;  Service: Cardiovascular;  Laterality: N/A;   CARDIOVERSION N/A 08/15/2018   Procedure: CARDIOVERSION;  Surgeon: Thurmon Fair, MD;  Location: [facility]OS[facility];  Service: Cardiovascular;  Laterality: N/A;   CARDIOVERSION N/A 04/30/2020   Procedure: CARDIOVERSION;  Surgeon: Thurmon Fair, MD;  Location: [facility]OS[facility];  Service: Cardiovascular;  Laterality: N/A;   CARDIOVERSION N/A 06/02/2020   Procedure: CARDIOVERSION (CATH LAB);  Surgeon: Regan Lemming, MD;  Location: [facility] INVASIVE CV LAB;  Service: Cardiovascular;  Laterality: N/A;   COLONOSCOPY  06/18/2001    Bx: Tubulovillous adenoma. Done by Dr. Charm Barges. He was told to come back 66yr later, but pt didnot.    ESOPHAGOGASTRODUODENOSCOPY  12/17/2014   mild gastritis. Small antral polyps (likely inflammatory). Status post empiric esophageal dilatation   HERNIA REPAIR     umblical   KNEE SURGERY     SKIN GRAFT  URETHRA SURGERY       Current Outpatient Medications  Medication Sig Dispense Refill   amiodarone (PACERONE) 200 MG tablet Take 1 tablet (200 mg total) by mouth 2 (two) times daily. 180 tablet 0   atorvastatin (LIPITOR) 10 MG tablet Take 10 mg by mouth daily.     buPROPion (WELLBUTRIN XL) 150 MG 24 hr tablet Take 450 mg by mouth daily.     clonazePAM (KLONOPIN) 1 MG tablet Take 1 mg by mouth every evening.     diltiazem  (CARDIZEM) 30 MG tablet Take 1 tablet (30 mg total) by mouth 4 (four) times daily as needed (for elevated heart rates greater than 110). 30 tablet 1   furosemide (LASIX) 40 MG tablet TAKE ONE TABLET BY MOUTH EVERY DAY 30 tablet 3   Misc Natural Products (APPLE CIDER VINEGAR DIET PO) Take 2 tablets by mouth in the morning and at bedtime.     nitroGLYCERIN (NITROSTAT) 0.4 MG SL tablet Place 0.4 mg under the tongue every 5 (five) minutes as needed for chest pain.     pantoprazole (PROTONIX) 40 MG tablet Take 40 mg by mouth daily.     Vitamin D, Ergocalciferol, (DRISDOL) 1.25 MG (50000 UNIT) CAPS capsule Take 1 capsule (50,000 Units total) by mouth every 7 (seven) days. 4 capsule 0   XARELTO 20 MG TABS tablet TAKE 1 TABLET BY MOUTH ONCE DAILY WITH SUPPER 90 tablet 1   No current facility-administered medications for this visit.    Allergies:   Patient has no known allergies.   Social History:  The patient  reports that he has quit smoking. His smoking use included cigars. He has never used smokeless tobacco. He reports current alcohol use. He reports that he does not use drugs.   Family History:  The patient's family history includes Diabetes in his paternal grandfather; Heart disease in his father and paternal grandfather; Hypertension in his father and mother; Obesity in his mother.   ROS:  Please see the history of present illness.   Otherwise, review of systems is positive for none.   All other systems are reviewed and negative.   PHYSICAL EXAM: VS:  BP 118/78   Pulse 73   Ht 6\' 3"  (1.905 m)   Wt (!) 387 lb (175.5 kg)   BMI 48.37 kg/m  , BMI Body mass index is 48.37 kg/m. GEN: Well nourished, well developed, in no acute distress  HEENT: normal  Neck: no JVD, carotid bruits, or masses Cardiac: Irregular; no murmurs, rubs, or gallops,no edema  Respiratory:  clear to auscultation bilaterally, normal work of breathing GI: soft, nontender, nondistended, + BS MS: no deformity or atrophy   Skin: warm and dry Neuro:  Strength and sensation are intact Psych: euthymic mood, full affect  EKG:  EKG is ordered today. Personal review of the ekg ordered shows AF, rate 73   Recent Labs: 04/26/2020: NT-Pro BNP 1,421 05/27/2020: ALT 24; BUN 17; Creatinine, Ser 0.93; Hemoglobin 15.8; Platelets 306; Potassium 4.4; Sodium 141; TSH 4.400    Lipid Panel     Component Value Date/Time   CHOL 165 05/27/2020 1209   TRIG 105 05/27/2020 1209   HDL 52 05/27/2020 1209   LDLCALC 94 05/27/2020 1209     Wt Readings from Last 3 Encounters:  08/30/20 (!) 387 lb (175.5 kg)  06/16/20 (!) 380 lb 6.4 oz (172.5 kg)  06/10/20 (!) 373 lb (169.2 kg)      Other studies Reviewed: Additional studies/ records that were  reviewed today include: TTE 07/17/18  Review of the above records today demonstrates:   1. The left ventricle has normal systolic function, with an ejection fraction of 55-60%. The cavity size was mildly dilated. There is moderately increased left ventricular wall thickness. Left ventricular diastolic Doppler parameters are consistent with  pseudonormalization.  2. The right ventricle has normal systolic function. The cavity was normal. There is no increase in right ventricular wall thickness.  3. Left atrial size was severely dilated.  4. Right atrial size was moderately dilated.  5. There is mild mitral annular calcification present.  6. The ascending aorta is normal in size and structure.  7. There is mild dilatation of the aortic root.  8. The interatrial septum was not assessed.   ASSESSMENT AND PLAN:  1.  Longstanding persistent atrial fibrillation: Reports of an ablation in the past.  He has had a cardioversion unfortunately though he did not convert.  His left atrium is severely dilated.  Was currently on Toprol-XL.  Also on amiodarone.  High risk medication monitoring. He is status post cardioversion 06/02/2020.  He has gone back into atrial fibrillation.  He is currently  feeling well.  He has no desire to get back into rhythm.  We Daesean Lazarz stop amiodarone today.  He is currently on Toprol-XL.  We a long discussion over rhythm versus rate control.  I have told him that if he does not use CPAP, eat healthy, or lose weight, he Ishani Goldwasser be very difficult to control.  He understands this.  He feels that now is not the time to make those changes.  2.  Morbid obesity: Diet and exercise encouraged.  Has been [facility] clinic at [facility].    3.  Hypertension: Currently well controlled  4.  Obstructive sleep apnea: Unable to tolerate CPAP.  Has follow-up with sleep medicine.  Refuses further therapy.  Case discussed with primary cardiology  Current medicines are reviewed at length with the patient today.   The patient does not have concerns regarding his medicines.  The following changes were made today: Stop amiodarone  Labs/ tests ordered today include:  No orders of the defined types were placed in this encounter.   Disposition:   FU with Shlomo Seres 63months  Signed, Jameil Whitmoyer Jorja Loa, MD  08/30/2020 4:00 PM     [facility] [address] [place] [address] [phone] (office) [phone] (fax)

## 2020-08-30 NOTE — Patient Instructions (Signed)
Medication Instructions:  °Your physician has recommended you make the following change in your medication:  °STOP Amiodarone ° °*If you need a refill on your cardiac medications before your next appointment, please call your pharmacy* ° ° °Lab Work: °None ordered ° ° °Testing/Procedures: °None ordered ° ° °Follow-Up: °At [facility], you and your health needs are our priority.  As part of our continuing mission to provide you with exceptional heart care, we have created designated Provider Care Teams.  These Care Teams include your primary Cardiologist (physician) and Advanced Practice Providers (APPs -  Physician Assistants and Nurse Practitioners) who all work together to provide you with the care you need, when you need it. ° °Your next appointment:   °3 month(s) ° °The format for your next appointment:   °In Person ° °Provider:   °Jesus Camnitz, MD ° ° ° °Thank you for choosing [facility]!! ° ° °Almer Bushey, RN °[phone] ° °

## 2020-09-09 NOTE — Telephone Encounter (Signed)
Patient states it was so long ago that he does not remember who prescribed his initial machine.

## 2020-09-24 ENCOUNTER — Other Ambulatory Visit: Payer: Self-pay

## 2020-09-24 NOTE — Telephone Encounter (Signed)
Rx request for Dofetilide denied , patient no longer of medication. [phone]

## 2020-11-18 ENCOUNTER — Other Ambulatory Visit: Payer: Self-pay | Admitting: Cardiology

## 2020-11-18 NOTE — Telephone Encounter (Signed)
Prescription refill request for Xarelto received.  Indication:AF  Last office visit:08/2020 Weight:175.5 Age:50 Scr:0.93 CrCl:159

## 2020-11-19 NOTE — Telephone Encounter (Signed)
Called and spoke to patient. He states that he does not wish to proceed with the sleep study at this time. He states that he is working on trying to get weight loss surgery. He states that he will call us back and let us know if/when he is ready to proceed with the sleep study.

## 2020-12-09 ENCOUNTER — Encounter (INDEPENDENT_AMBULATORY_CARE_PROVIDER_SITE_OTHER): Payer: Self-pay

## 2020-12-09 ENCOUNTER — Ambulatory Visit (INDEPENDENT_AMBULATORY_CARE_PROVIDER_SITE_OTHER): Payer: 59 | Admitting: Family Medicine

## 2020-12-27 ENCOUNTER — Ambulatory Visit: Payer: 59 | Admitting: Cardiology

## 2021-02-01 ENCOUNTER — Telehealth: Payer: Self-pay

## 2021-02-01 NOTE — Telephone Encounter (Signed)
Letter has been sent to patient informing them that their sleep study has expired. Patient will need to call and schedule an office visit to re-evaluate the need for a sleep study.    

## 2021-03-23 ENCOUNTER — Other Ambulatory Visit: Payer: Self-pay

## 2021-03-23 MED ORDER — FUROSEMIDE 40 MG PO TABS: 40.0000 mg | ORAL_TABLET | Freq: Every day | ORAL | 3 refills | Status: DC

## 2021-03-23 NOTE — Telephone Encounter (Signed)
This is Dr. Munley pt 

## 2021-05-15 ENCOUNTER — Other Ambulatory Visit: Payer: Self-pay | Admitting: Cardiology

## 2021-05-16 NOTE — Telephone Encounter (Signed)
Prescription refill request for Xarelto received.  ?Indication:Afib ?Last office visit:6/22 ?Weight:175.5 kg ?Age:51 ?Scr:0.9 ?CrCl:238.33 ml/min ? ?Prescription refilled ? ?

## 2021-06-19 ENCOUNTER — Other Ambulatory Visit: Payer: Self-pay | Admitting: Cardiology

## 2021-06-27 ENCOUNTER — Other Ambulatory Visit: Payer: Self-pay | Admitting: Cardiology

## 2021-06-27 NOTE — Telephone Encounter (Signed)
Furosemide 40 mg # 30 only with message that patient needs appointment for future refills sent to [facility] ?

## 2021-06-28 ENCOUNTER — Telehealth: Payer: Self-pay | Admitting: Cardiology

## 2021-06-28 MED ORDER — METOPROLOL SUCCINATE ER 50 MG PO TB24
50.0000 mg | ORAL_TABLET | Freq: Every day | ORAL | 0 refills | Status: DC
Start: 2021-06-28 — End: 2021-07-21

## 2021-06-28 NOTE — Telephone Encounter (Signed)
Pt calling requesting a refill on metoprolol. This medication was D/C by provider, but pt was still taking this medication. Would Dr. Curt Bears like for pt to continue this medication? If so, this medication need to be added back to medication list. Please address ?

## 2021-06-28 NOTE — Telephone Encounter (Signed)
Pt reports that he has continued taking Toprol 100 mg QD, says he has never been told to stop it that he can recall. ?Reports HRs still running into 100s occasionally and he takes Diltiazem PRN. ?Pt aware he is overdue for follow up. ?Reviewed w/ Dr. Elberta Fortis. ?Pt advised we will send in for Toprol 50 QD for now and see how HR does. ?Scheduled to see Dr. Elberta Fortis 5/8. ?30 day supply of Toprol sent to pharmacy. ?Patient verbalized understanding and agreeable to plan.  ? ?

## 2021-06-28 NOTE — Telephone Encounter (Signed)
?*  STAT* If patient is at the pharmacy, call can be transferred to refill team. ? ? ?1. Which medications need to be refilled? (please list name of each medication and dose if known)  ?metoprolol succinate (TOPROL-XL) 100 MG 24 hr tablet  ? ? ?2. Which pharmacy/location (including street and city if local pharmacy) is medication to be sent to? ?[id number] [place] [place] - [address] ? ?3. Do they need a 30 day or 90 day supply? 90 with refills ?

## 2021-07-04 ENCOUNTER — Ambulatory Visit: Payer: 59 | Admitting: Cardiology

## 2021-07-18 ENCOUNTER — Ambulatory Visit: Payer: 59 | Admitting: Cardiology

## 2021-07-18 NOTE — Progress Notes (Incomplete)
? ?Electrophysiology Office Note ? ? ?Date:  07/18/2021  ? ?ID:  Jesus Barnes, DOB 1970-05-07, MRN [id number] ? ?PCP:  Dulce Sellar, NP  ?Cardiologist:  Dulce Sellar ?Primary Electrophysiologist:  Reyne Falconi Jorja Loa, MD   ? ?Chief Complaint: AF ?  ?History of Present Illness: ?Jesus Barnes is a 51 y.o. male who is being seen today for the evaluation of AF at the request of Dulce Sellar, NP. Presenting today for electrophysiology evaluation. ? ?Presented for atrial fibrillation, hypertension, coronary artery disease, morbid obesity, sleep apnea.  He has had multiple cardioversions in the past.  He had previously been on dofetilide.  He went back into atrial fibrillation and had tachycardia mediated cardiomyopathy but since has improved.  His dofetilide stopped and he went back into atrial fibrillation.  He was loaded on amiodarone and had a cardioversion 06/02/2020.  Unfortunately went back into atrial fibrillation. ? ?Today, denies symptoms of palpitations, chest pain, shortness of breath, orthopnea, PND, lower extremity edema, claudication, dizziness, presyncope, syncope, bleeding, or neurologic sequela. The patient is tolerating medications without difficulties. ***  ? ?Past Medical History:  ?Diagnosis Date  ? Anxiety 09/02/2014  ? Back pain   ? Burn   ? Chest pain   ? Chronic anticoagulation 10/16/2016  ? Chronic fatigue syndrome   ? Depression   ? Dilated cardiomyopathy (HCC) 10/16/2016  ? EF 30-35%, 05/15/16 40%, 07/17/18 55-60%  ? Dysphagia   ? Esophageal stricture 09/02/2014  ? Essential hypertension 09/01/2014  ? GERD (gastroesophageal reflux disease)   ? 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HCC)   ? High cholesterol   ? Hypertension   ? Hypertension, essential 08/08/2018  ? Hypertensive heart disease 09/01/2014  ? Joint pain   ? Mild CAD 10/16/2016  ? Morbid obesity with body mass index (BMI) of 45.0 to 49.9 in adult [facility]) 04/05/2016  ? Obstructive sleep apnea   ? awaiting treatment  ? On amiodarone therapy 10/16/2016  ? Palpitation    ? Paroxysmal atrial fibrillation (HCC) 12/12/2016  ? Persistent atrial fibrillation (HCC) 09/01/2014  ? Overview:  CHADS2=1  ? Sleep apnea   ? SOB (shortness of breath)   ? ?Past Surgical History:  ?Procedure Laterality Date  ? APPENDECTOMY    ? CARDIOVERSION    ? CARDIOVERSION N/A 12/14/2016  ? Procedure: CARDIOVERSION;  Surgeon: Marinus Maw, MD;  Location: [facility] INVASIVE CV LAB;  Service: Cardiovascular;  Laterality: N/A;  ? CARDIOVERSION N/A 08/15/2018  ? Procedure: CARDIOVERSION;  Surgeon: Thurmon Fair, MD;  [facility]: [facility] ENDOSCOPY;  Service: Cardiovascular;  Laterality: N/A;  ? CARDIOVERSION N/A 04/30/2020  ? Procedure: CARDIOVERSION;  Surgeon: Thurmon Fair, MD;  Location: [facility] ENDOSCOPY;  Service: Cardiovascular;  Laterality: N/A;  ? CARDIOVERSION N/A 06/02/2020  ? Procedure: CARDIOVERSION (CATH LAB);  Surgeon: Regan Lemming, MD;  Location: [facility] INVASIVE CV LAB;  Service: Cardiovascular;  Laterality: N/A;  ? COLONOSCOPY  06/18/2001  ?  Bx: Tubulovillous adenoma. Done by Dr. Charm Barges. He was told to come back 37yr later, but pt didnot.   ? ESOPHAGOGASTRODUODENOSCOPY  12/17/2014  ? mild gastritis. Small antral polyps (likely inflammatory). Status post empiric esophageal dilatation  ? HERNIA REPAIR    ? umblical  ? KNEE SURGERY    ? SKIN GRAFT    ? URETHRA SURGERY    ? ? ? ?Current Outpatient Medications  ?Medication Sig Dispense Refill  ? atorvastatin (LIPITOR) 10 MG tablet Take 10 mg by mouth daily.    ? buPROPion (WELLBUTRIN XL) 150 MG 24 hr tablet Take  450 mg by mouth daily.    ? clonazePAM (KLONOPIN) 1 MG tablet Take 1 mg by mouth every evening.    ? diltiazem (CARDIZEM) 30 MG tablet Take 1 tablet (30 mg total) by mouth 4 (four) times daily as needed (for elevated heart rates greater than 110). 30 tablet 1  ? furosemide (LASIX) 40 MG tablet Take 1 tablet (40 mg total) by mouth daily. Needs appointment for future refills 30 tablet 0  ? metoprolol succinate (TOPROL-XL) 50 MG 24 hr tablet Take 1 tablet (50 mg  total) by mouth daily. Take with or immediately following a meal. 30 tablet 0  ? Misc Natural Products (APPLE CIDER VINEGAR DIET PO) Take 2 tablets by mouth in the morning and at bedtime.    ? nitroGLYCERIN (NITROSTAT) 0.4 MG SL tablet Place 0.4 mg under the tongue every 5 (five) minutes as needed for chest pain.    ? pantoprazole (PROTONIX) 40 MG tablet Take 40 mg by mouth daily.    ? Vitamin D, Ergocalciferol, (DRISDOL) 1.25 MG (50000 UNIT) CAPS capsule Take 1 capsule (50,000 Units total) by mouth every 7 (seven) days. 4 capsule 0  ? XARELTO 20 MG TABS tablet TAKE 1 TABLET BY MOUTH ONCE DAILY WITH SUPPER 90 tablet 1  ? ?No current facility-administered medications for this visit.  ? ? ?Allergies:   Patient has no known allergies.  ? ?Social History:  The patient  reports that he has quit smoking. His smoking use included cigars. He has never used smokeless tobacco. He reports current alcohol use. He reports that he does not use drugs.  ? ?Family History:  The patient's family history includes Diabetes in his paternal grandfather; Heart disease in his father and paternal grandfather; Hypertension in his father and mother; Obesity in his mother.  ? ?ROS:  Please see the history of present illness.   Otherwise, review of systems is positive for none.   All other systems are reviewed and negative.  ? ?PHYSICAL EXAM: ?VS:  There were no vitals taken for this visit. , BMI There is no height or weight on file to calculate BMI. ?GEN: Well nourished, well developed, in no acute distress  ?HEENT: normal  ?Neck: no JVD, carotid bruits, or masses ?Cardiac: ***RRR; no murmurs, rubs, or gallops,no edema  ?Respiratory:  clear to auscultation bilaterally, normal work of breathing ?GI: soft, nontender, nondistended, + BS ?MS: no deformity or atrophy  ?Skin: warm and dry ?Neuro:  Strength and sensation are intact ?Psych: euthymic mood, full affect ? ?EKG:  EKG {ACTION; IS/IS [id number]} ordered today. ?Personal review of the ekg  ordered *** shows ***  ? ?Recent Labs: ?No results found for requested labs within last 8760 hours.  ? ? ?Lipid Panel  ?   ?Component Value Date/Time  ? CHOL 165 05/27/2020 1209  ? TRIG 105 05/27/2020 1209  ? HDL 52 05/27/2020 1209  ? LDLCALC 94 05/27/2020 1209  ? ? ? ?Wt Readings from Last 3 Encounters:  ?08/30/20 (!) 387 lb (175.5 kg)  ?06/16/20 (!) 380 lb 6.4 oz (172.5 kg)  ?06/10/20 (!) 373 lb (169.2 kg)  ?  ? ? ?Other studies Reviewed: ?Additional studies/ records that were reviewed today include: TTE 07/17/18  ?Review of the above records today demonstrates:  ? 1. The left ventricle has normal systolic function, with an ejection fraction of 55-60%. The cavity size was mildly dilated. There is moderately increased left ventricular wall thickness. Left ventricular diastolic Doppler parameters are consistent with ? pseudonormalization. ?  2. The right ventricle has normal systolic function. The cavity was normal. There is no increase in right ventricular wall thickness. ? 3. Left atrial size was severely dilated. ? 4. Right atrial size was moderately dilated. ? 5. There is mild mitral annular calcification present. ? 6. The ascending aorta is normal in size and structure. ? 7. There is mild dilatation of the aortic root. ? 8. The interatrial septum was not assessed. ? ? ?ASSESSMENT AND PLAN: ? ?1.  Longstanding persistent atrial fibrillation: Her reports of an ablation in the past.  Had a cardioversion but did not convert.  Left atrium severely dilated.  Amiodarone was stopped at the last visit.  Currently on Toprol-XL 50 mg daily, Xarelto 20 mg daily.  CHA2DS2-VASc of 1-2.*** ? ?2.  Morbid obesity: There is no height or weight on file to calculate BMI. ?Has been referred to the weight loss clinic at [facility] ? ?3.  Hypertension:*** ? ?4.  Obstructive sleep apnea: CPAP compliance encouraged.  He states he is unable to tolerate the mask. ? ? ?Current medicines are reviewed at length with the patient today.   ?The  patient does not have concerns regarding his medicines.  The following changes were made today: *** ? ?Labs/ tests ordered today include:  ?No orders of the defined types were placed in this encounter

## 2021-07-21 ENCOUNTER — Other Ambulatory Visit: Payer: Self-pay | Admitting: Cardiology

## 2021-08-10 ENCOUNTER — Other Ambulatory Visit: Payer: Self-pay | Admitting: Cardiology

## 2021-08-26 ENCOUNTER — Other Ambulatory Visit: Payer: Self-pay | Admitting: Cardiology

## 2021-09-09 ENCOUNTER — Telehealth: Payer: Self-pay | Admitting: Cardiology

## 2021-09-09 ENCOUNTER — Other Ambulatory Visit: Payer: Self-pay

## 2021-09-09 MED ORDER — RIVAROXABAN 20 MG PO TABS: 20.0000 mg | ORAL_TABLET | Freq: Every day | ORAL | 3 refills | Status: DC

## 2021-09-09 NOTE — Telephone Encounter (Signed)
*  STAT* If patient is at the pharmacy, call can be transferred to refill team.   1. Which medications need to be refilled? (please list name of each medication and dose if known) XARELTO 20 MG TABS tablet  2. Which pharmacy/location (including street and city if local pharmacy) is medication to be sent to? [id number] - [place], [place] - [address]  3. Do they need a 30 day or 90 day supply? 90

## 2021-09-09 NOTE — Telephone Encounter (Signed)
Called patient and he needed a refill on his xarelto medication. A refill for his Xarelto was sent to his pharmacy via Epic. Patient had no further questions at this time.

## 2021-10-19 ENCOUNTER — Encounter (INDEPENDENT_AMBULATORY_CARE_PROVIDER_SITE_OTHER): Payer: Self-pay

## 2021-12-05 ENCOUNTER — Encounter: Payer: Self-pay | Admitting: *Deleted

## 2021-12-13 ENCOUNTER — Other Ambulatory Visit: Payer: Self-pay | Admitting: Cardiology

## 2021-12-13 ENCOUNTER — Telehealth: Payer: Self-pay | Admitting: Cardiology

## 2021-12-13 MED ORDER — RIVAROXABAN 20 MG PO TABS: 20.0000 mg | ORAL_TABLET | Freq: Every day | ORAL | 1 refills | Status: DC

## 2021-12-13 NOTE — Telephone Encounter (Signed)
*  STAT* If patient is at the pharmacy, call can be transferred to refill team.   1. Which medications need to be refilled? (please list name of each medication and dose if known)   rivaroxaban (XARELTO) 20 MG TABS tablet    2. Which pharmacy/location (including street and city if local pharmacy) is medication to be sent to?  [id number] - [place], [place] - [place]  3. Do they need a 30 day or 90 day supply? [place]

## 2021-12-13 NOTE — Telephone Encounter (Signed)
Refill sent with message patient must keep 02/14/22 scheduled appointment for future refills / final attempt.

## 2021-12-13 NOTE — Telephone Encounter (Signed)
Prescription refill request for Xarelto received.  Indication: AF Last office visit: 08/30/20  Elliot Cousin MD (Has appt with Dr Bettina Gavia 02/15/22) Weight: 175.5kg Age: 51 Scr: 0.83 on 09/15/21 CrCl: 255.50  Based on above findings Xarelto 20mg  daily is the appropriate dose.  Refill approved.

## 2022-01-04 ENCOUNTER — Other Ambulatory Visit: Payer: Self-pay | Admitting: Cardiology

## 2022-02-02 ENCOUNTER — Other Ambulatory Visit: Payer: Self-pay | Admitting: Cardiology

## 2022-02-13 NOTE — Progress Notes (Unsigned)
Cardiology Office Note:    Date:  02/14/2022   ID:  Jesus Barnes, DOB 1970/06/22, MRN [id number]  PCP:  Dulce Sellar, NP  Cardiologist:  Norman Herrlich, MD    Referring MD: Dulce Sellar, NP    ASSESSMENT:    1. Persistent atrial fibrillation (HCC)   2. Hypertensive heart disease with chronic diastolic congestive heart failure (HCC)    PLAN:    In order of problems listed above:  He is doing very poorly he is in rapid atrial fibrillation quite symptomatic hypertension and is concerned he has recurrent previous tachycardia induced cardiomyopathy.  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  He failed therapy with Tikosyn because of difficulty with compliance of the medication.  He may be one of the rare individuals would benefit from AV nodal ablation and physiologic pacing.  I have asked him to give thought to this and if we cannot improve the quality of his life and I will refer him to EP for consideration.   Next appointment: 2 weeks   Medication Adjustments/Labs and Tests Ordered: Current medicines are reviewed at length with the patient today.  Concerns regarding medicines are outlined above.  No orders of the defined types were placed in this encounter.  No orders of the defined types were placed in this encounter.    chief complaint feels very badly with rapid uncontrolled atrial fibrillation and hypertension   History of Present Illness:    Jesus Barnes is a 51 y.o. male with a hx of paroxysmal atrial fibrillation with chronic anticoagulation hypertensive heart disease with chronic diastolic heart failure precipitated by atrial fibrillation mild CAD last seen  06/16/2020.  He was seen by EP consultation 08/30/2020 unable to maintain a sinus rhythm after remote catheter ablation with antiarrhythmic therapy amiodarone and cardioversion and opted for rate control and anticoagulation.  Echo cardiogram performed May 2020 showed moderate LVH and normal ejection fraction of 55 to 60% moderate right atrial severe left atrial enlargement.  Compliance with diet, lifestyle and medications: No, he has not been taking diltiazem for rate control or his lisinopril  He is not feeling well he was unaware he was in rapid atrial fibrillation he complains of marked fatigue and weakness and his atrial fibrillation is rate heart rate of 151 bpm he is also short of breath with activity no orthopnea chest pain or syncope.  He is very bothered by rapid heart rhythm palpitation  Past Medical History:  Diagnosis Date   Anxiety 09/02/2014   Back pain    Burn    Chest pain    Chronic anticoagulation 10/16/2016   Chronic fatigue syndrome    Depression    Dilated cardiomyopathy (HCC) 10/16/2016   EF 30-35%, 05/15/16 40%, 07/17/18 55-60%   Dysphagia    Esophageal stricture 09/02/2014   Essential hypertension 09/01/2014   GERD (gastroesophageal reflux disease)    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HCC)    High cholesterol    Hypertension    Hypertension, essential 08/08/2018   Hypertensive heart disease 09/01/2014   Joint pain    Mild CAD 10/16/2016   Morbid obesity with body mass index (BMI) of 45.0 to 49.9 in adult [facility]) 04/05/2016   Obstructive sleep  apnea    awaiting treatment   On amiodarone therapy 10/16/2016   Palpitation    Paroxysmal atrial fibrillation (HCC) 12/12/2016   Persistent atrial fibrillation (HCC) 09/01/2014   Overview:  CHADS2=1   Sleep apnea    SOB (shortness of breath)     Past Surgical History:  Procedure Laterality Date   APPENDECTOMY     CARDIOVERSION     CARDIOVERSION N/A 12/14/2016   Procedure: CARDIOVERSION;  Surgeon: Marinus Maw, MD;  [facility]: [facility] INVASIVE CV  LAB;  Service: Cardiovascular;  Laterality: N/A;   CARDIOVERSION N/A 08/15/2018   Procedure: CARDIOVERSION;  Surgeon: Thurmon Fair, MD;  Location: [facility] ENDOSCOPY;  Service: Cardiovascular;  Laterality: N/A;   CARDIOVERSION N/A 04/30/2020   Procedure: CARDIOVERSION;  Surgeon: Thurmon Fair, MD;  Location: [facility]OS[facility];  Service: Cardiovascular;  Laterality: N/A;   CARDIOVERSION N/A 06/02/2020   Procedure: CARDIOVERSION (CATH LAB);  Surgeon: Regan Lemming, MD;  Location: [facility] INVASIVE CV LAB;  Service: Cardiovascular;  Laterality: N/A;   COLONOSCOPY  06/18/2001    Bx: Tubulovillous adenoma. Done by Dr. Charm Barges. He was told to come back 92yr later, but pt didnot.    ESOPHAGOGASTRODUODENOSCOPY  12/17/2014   mild gastritis. Small antral polyps (likely inflammatory). Status post empiric esophageal dilatation   HERNIA REPAIR     umblical   KNEE SURGERY     SKIN GRAFT     URETHRA SURGERY      Current Medications: Current Meds  Medication Sig   furosemide (LASIX) 40 MG tablet Take 1 tablet (40 mg total) by mouth daily. Patient must keep 02/14/22 appointment for future refills / final attempt   metoprolol succinate (TOPROL-XL) 50 MG 24 hr tablet TAKE 1 TABLET BY MOUTH DAILY. TAKE WITH OR IMMEDIATELY FOLLOWING A MEAL.   nitroGLYCERIN (NITROSTAT) 0.4 MG SL tablet Place 0.4 mg under the tongue every 5 (five) minutes as needed for chest pain.   rivaroxaban (XARELTO) 20 MG TABS tablet Take 1 tablet (20 mg total) by mouth daily with supper.   Vitamin D, Ergocalciferol, (DRISDOL) 1.25 MG (50000 UNIT) CAPS capsule Take 1 capsule (50,000 Units total) by mouth every 7 (seven) days.     Allergies:   Lipitor [atorvastatin]   Social History   Socioeconomic History   Marital status: Married    Spouse name: Not on file   Number of children: 1   Years of education: Not on file   Highest education level: Not on file  Occupational History   Occupation: Truck driver  Tobacco Use   Smoking status: Former     Types: Cigars   Smokeless tobacco: Never   Tobacco comments:    quit 7 years ago  Vaping Use   Vaping Use: Never used  Substance and Sexual Activity   Alcohol use: Yes    Comment: ocassionally   Drug use: No   Sexual activity: Not on file  Other Topics Concern   Not on file  Social History Narrative   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[place]   Social Determinants of Health   Financial Resource Strain: Not on file  Food Insecurity: Not on file  Transportation Needs: Not on file  Physical Activity: Not on file  Stress: Not on file  Social Connections: Not on file     Family History: The patient's family history includes Diabetes in his paternal grandfather; Heart disease in his father and paternal grandfather; Hypertension in his father and mother; Obesity in his mother. There is no history  of Colon cancer or Esophageal cancer. ROS:   Please see the history of present illness.    All other systems reviewed and are negative.  EKGs/Labs/Other Studies Reviewed:    The following studies were reviewed today:  TTE 07/17/18  Review of the above records today demonstrates:   1. The left ventricle has normal systolic function, with an ejection fraction of 55-60%. The cavity size was mildly dilated. There is moderately increased left ventricular wall thickness. Left ventricular diastolic Doppler parameters are consistent with  pseudonormalization.  2. The right ventricle has normal systolic function. The cavity was normal. There is no increase in right ventricular wall thickness.  3. Left atrial size was severely dilated.  4. Right atrial size was moderately dilated.  5. There is mild mitral annular calcification present.  6. The ascending aorta is normal in size and structure.  7. There is mild dilatation of the aortic root.  8. The interatrial septum was not assessed.  EKG:  EKG ordered today and personally reviewed.  The ekg ordered today demonstrates atrial fibrillation rapid  ventricular rate of 151 bpm  Recent Labs: No results found for requested labs within last 365 days.  Recent Lipid Panel    Component Value Date/Time   CHOL 165 05/27/2020 1209   TRIG 105 05/27/2020 1209   HDL 52 05/27/2020 1209   LDLCALC 94 05/27/2020 1209    Physical Exam:    VS:  BP (!) 144/100 (BP Location: Right Arm, Patient Position: Sitting, Cuff Size: Large)   Pulse (!) 151   Ht 6' 2.5" (1.892 m)   Wt (!) 402 lb (182.3 kg)   SpO2 98%   BMI 50.92 kg/m     Wt Readings from Last 3 Encounters:  02/14/22 (!) 402 lb (182.3 kg)  08/30/20 (!) 387 lb (175.5 kg)  06/16/20 (!) 380 lb 6.4 oz (172.5 kg)     GEN: He looks fatigued well nourished, well developed in no acute distress obese BMI exceeds 50 HEENT: Normal NECK: No JVD; No carotid bruits LYMPHATICS: No lymphadenopathy CARDIAC: Rapid irregular heart rhythm no murmurs, rubs, gallops RESPIRATORY:  Clear to auscultation without rales, wheezing or rhonchi  ABDOMEN: Soft, non-tender, non-distended MUSCULOSKELETAL:  No edema; No deformity  SKIN: Warm and dry NEUROLOGIC:  Alert and oriented x 3 PSYCHIATRIC:  Normal affect    Signed, Norman Herrlich, MD  02/14/2022 3:53 PM    [facility]

## 2022-02-14 ENCOUNTER — Other Ambulatory Visit: Payer: Self-pay

## 2022-02-14 ENCOUNTER — Ambulatory Visit: Payer: 59 | Attending: Cardiology | Admitting: Cardiology

## 2022-02-14 ENCOUNTER — Encounter: Payer: Self-pay | Admitting: Cardiology

## 2022-02-14 VITALS — BP 144/100 | HR 151 | Ht 74.5 in | Wt >= 6400 oz

## 2022-02-14 DIAGNOSIS — I110 Hypertensive heart disease with heart failure: Secondary | ICD-10-CM

## 2022-02-14 DIAGNOSIS — I5032 Chronic diastolic (congestive) heart failure: Secondary | ICD-10-CM | POA: Diagnosis not present

## 2022-02-14 DIAGNOSIS — I4819 Other persistent atrial fibrillation: Secondary | ICD-10-CM | POA: Diagnosis not present

## 2022-02-14 MED ORDER — LISINOPRIL 20 MG PO TABS: 20.0000 mg | ORAL_TABLET | Freq: Every day | ORAL | 3 refills | Status: DC

## 2022-02-14 MED ORDER — DILTIAZEM HCL 30 MG PO TABS: 30.0000 mg | ORAL_TABLET | Freq: Three times a day (TID) | ORAL | 3 refills | Status: DC

## 2022-02-14 MED ORDER — METOPROLOL TARTRATE 50 MG PO TABS: 50.0000 mg | ORAL_TABLET | Freq: Three times a day (TID) | ORAL | 3 refills | Status: DC

## 2022-02-14 NOTE — Patient Instructions (Signed)
Medication Instructions:  Your physician has recommended you make the following change in your medication:   START: Lopressor 50 mg three times daily START: Cardizem 30 mg three times daily START: Lisinopril 20 mg daily  *If you need a refill on your cardiac medications before your next appointment, please call your pharmacy*   Lab Work: Your physician recommends that you return for lab work in:   Labs today: CMP, CBC, Pro BNP, TSH  If you have labs (blood work) drawn today and your tests are completely normal, you will receive your results only by: MyChart Message (if you have MyChart) OR A paper copy in the mail If you have any lab test that is abnormal or we need to change your treatment, we will call you to review the results.   Testing/Procedures: Your physician has requested that you have an echocardiogram. Echocardiography is a painless test that uses sound waves to create images of your heart. It provides your doctor with information about the size and shape of your heart and how well your heart's chambers and valves are working. This procedure takes approximately one hour. There are no restrictions for this procedure. Please do NOT wear cologne, perfume, aftershave, or lotions (deodorant is allowed). Please arrive 15 minutes prior to your appointment time.    Follow-Up: At [facility], you and your health needs are our priority.  As part of our continuing mission to provide you with exceptional heart care, we have created designated Provider Care Teams.  These Care Teams include your primary Cardiologist (physician) and Advanced Practice Providers (APPs -  Physician Assistants and Nurse Practitioners) who all work together to provide you with the care you need, when you need it.  We recommend signing up for the patient portal called "MyChart".  Sign up information is provided on this After Visit Summary.  MyChart is used to connect with patients for Virtual Visits  (Telemedicine).  Patients are able to view lab/test results, encounter notes, upcoming appointments, etc.  Non-urgent messages can be sent to your provider as well.   To learn more about what you can do with MyChart, go to [email or web address].    Your next appointment:   2 week(s)  The format for your next appointment:   In Person  Provider:   Norman Herrlich, MD    Other Instructions None  Important Information About Sugar

## 2022-02-15 LAB — COMPREHENSIVE METABOLIC PANEL
ALT: 29 IU/L (ref 0–44)
AST: 26 IU/L (ref 0–40)
Albumin/Globulin Ratio: 2 (ref 1.2–2.2)
Albumin: 4.5 g/dL (ref 3.8–4.9)
Alkaline Phosphatase: 70 IU/L (ref 44–121)
BUN/Creatinine Ratio: 13 (ref 9–20)
BUN: 11 mg/dL (ref 6–24)
Bilirubin Total: 0.6 mg/dL (ref 0.0–1.2)
CO2: 22 mmol/L (ref 20–29)
Calcium: 9.1 mg/dL (ref 8.7–10.2)
Chloride: 103 mmol/L (ref 96–106)
Creatinine, Ser: 0.88 mg/dL (ref 0.76–1.27)
Globulin, Total: 2.3 g/dL (ref 1.5–4.5)
Glucose: 87 mg/dL (ref 70–99)
Potassium: 4.4 mmol/L (ref 3.5–5.2)
Sodium: 141 mmol/L (ref 134–144)
Total Protein: 6.8 g/dL (ref 6.0–8.5)
eGFR: 103 mL/min/{1.73_m2} (ref >59)

## 2022-02-15 LAB — TSH: TSH: 3.32 u[IU]/mL (ref 0.450–4.500)

## 2022-02-15 LAB — CBC
Hematocrit: 46.1 % (ref 37.5–51.0)
Hemoglobin: 15.1 g/dL (ref 13.0–17.7)
MCH: 29.1 pg (ref 26.6–33.0)
MCHC: 32.8 g/dL (ref 31.5–35.7)
MCV: 89 fL (ref 79–97)
Platelets: 279 10*3/uL (ref 150–450)
RBC: 5.19 x10E6/uL (ref 4.14–5.80)
RDW: 12.7 % (ref 11.6–15.4)
WBC: 7.7 10*3/uL (ref 3.4–10.8)

## 2022-02-15 LAB — PRO B NATRIURETIC PEPTIDE: NT-Pro BNP: 670 pg/mL — ABNORMAL HIGH (ref 0–121)

## 2022-02-28 ENCOUNTER — Ambulatory Visit: Payer: 59 | Attending: Cardiology

## 2022-02-28 DIAGNOSIS — I4819 Other persistent atrial fibrillation: Secondary | ICD-10-CM

## 2022-02-28 DIAGNOSIS — I5032 Chronic diastolic (congestive) heart failure: Secondary | ICD-10-CM

## 2022-02-28 DIAGNOSIS — I110 Hypertensive heart disease with heart failure: Secondary | ICD-10-CM | POA: Diagnosis not present

## 2022-02-28 LAB — ECHOCARDIOGRAM COMPLETE
Area-P 1/2: 4.08 cm2
Calc EF: 30 %
S' Lateral: 5.2 cm
Single Plane A2C EF: 33.2 %
Single Plane A4C EF: 28.5 %

## 2022-02-28 MED ORDER — PERFLUTREN LIPID MICROSPHERE
1.0000 mL | INTRAVENOUS | Status: AC | PRN
  Administered 2022-02-28: 8 mL via INTRAVENOUS

## 2022-02-28 NOTE — Progress Notes (Unsigned)
Cardiology Office Note:    Date:  03/01/2022   ID:  Jesus Barnes, DOB 05/05/1970, MRN [id number]  PCP:  Janine Limbo, PA-C  Cardiologist:  Shirlee More, MD    Referring MD: Jeanie Sewer, NP    ASSESSMENT:    1. Persistent atrial fibrillation ([place])   2. Chronic anticoagulation   3. Hypertensive heart disease with chronic diastolic congestive heart failure ([place])   4. Secondary dilated cardiomyopathy ([place])   5. Obstructive sleep apnea   6. Morbid obesity ([place])    PLAN:    In order of problems listed above:  Difficult case he has recurrent atrial fibrillation influenced by noncompliance with his antiarrhythmic drug morbid obesity untreated sleep apnea but also has severe dilated cardiomyopathy rate is controlled continue his beta-blocker continue his anticoagulant and optimize his medical therapy for cardiomyopathy. I will reach out to the PA for a recommendation regarding initiating Tikosyn again as an inpatient and cardioversion Will need evaluation and intervention for sleep apnea?  Semaglutide therapy with coincident morbid obesity   Next appointment: 4 weeks   Medication Adjustments/Labs and Tests Ordered: Current medicines are reviewed at length with the patient today.  Concerns regarding medicines are outlined above.  No orders of the defined types were placed in this encounter.  No orders of the defined types were placed in this encounter.   Chief Complaint  Patient presents with   Follow-up   Atrial Fibrillation   Cardiomyopathy    History of Present Illness:    Jesus Barnes is a 51 y.o. male with a hx of  paroxysmal atrial fibrillation with catheter ablation prior to 2018 chronic anticoagulation hypertensive heart disease with chronic diastolic heart failure precipitated by atrial fibrillation mild CAD last seen 02/14/2022 with rapid uncontrolled atrial fibrillation and concern of recurrent cardiomyopathy.  He was seen by EP consultation 08/30/2020  unable to maintain a sinus rhythm after remote catheter ablation with antiarrhythmic therapy amiodarone and cardioversion and opted for rate control and anticoagulation.  Echocardiogram performed May 2020 showed moderate LVH and normal ejection fraction of 55 to 60% moderate right atrial severe left atrial enlargement.  Other problems include his morbid obesity BMI approaches 51% and obstructive sleep apnea  In contrast to his previous office visit heart rate was 80 to 110 bpm during his echocardiogram.  Echocardiogram performed 02/28/2022 shows severe LV dysfunction EF in the range of 30 to 35%. Previous EF 55-60% 07/16/2020. 1. Left ventricular ejection fraction, by estimation, is 30 to 35%. The  left ventricle has severely decreased function. The left ventricle  demonstrates global hypokinesis. The left ventricular internal cavity size  was mildly dilated. There is moderate  concentric left ventricular hypertrophy. Left ventricular diastolic  parameters are indeterminate.   2. Right ventricular systolic function is normal. The right ventricular  size is not well visualized.   3. Left atrial size was moderately dilated.   4. The mitral valve is normal in structure. No evidence of mitral valve  regurgitation. No evidence of mitral stenosis.   5. The aortic valve is tricuspid. Aortic valve regurgitation is not  visualized. No aortic stenosis is present.   Left heart cath 04/05/2016:  Diagnostic procedure: Left Heart Cath , Selective Coronaries, Left   Ventricular Injection  Complications: No Complications.  Conclusions  Diagnostic Procedure Summary  1. Mild Non-obstructive CAD  2. Mild LV dysfunction, LVEF 30-35%  Diagnostic Procedure Recommendations  1. Pt is in A fib with RVR at 120 BPM even after sedation. Will  ask EP for  consult.  2. Echo to confirm LVEF.  I have reviewed the recent history and physical documentation. I personally  spent 20 minutes continuously monitoring the  patient during the administration  of moderate sedation. Pre and post activities have been reviewed. I was  present for the entire procedure.   Signatures   Electronically signed by Rozann Lesches, MD(Diagnostic Physician) on   04/06/2016 13:50   Angiographic findings   Cardiac Arteries and Lesion Findings  LMCA: 0%.  LAD: 0%.  LCx: 0%.  RCA:   Lesion on Mid RCA: 10% stenosis.  Procedure Data  Procedure Date  Date: 04/06/2016 Start: 11:34  Admission Data  Admission Date: 04/06/2016   Coronary [place]   Dominance: Right  VA  LV function assessed [id number].  Ejection Fraction    - Method: LV gram. EF%: 35.   6. There is mild dilatation of the aortic root and of the ascending  aorta, measuring 39 mm.   7. The inferior vena cava is normal in size with greater than 50%  respiratory variability, suggesting right atrial pressure of 3 mmHg  Component Ref Range & Units 2 wk ago (02/14/22) 1 yr ago (04/26/20) 3 yr ago (08/23/18) 3 yr ago (08/06/18) 3 yr ago (07/16/18)  NT-Pro BNP 0 - 121 pg/mL 670 High  1,421 High  CM 18 CM 753 High  CM 320 High     Compliance with diet, lifestyle and medications: Yes  His wife is present participates in evaluation decision He feels better but is now well he still has marked exercise intolerance and weakness but he is not having shortness of breath at rest or chest pain palpitation or syncope He stands he has a severe secondary cardiomyopathy due to his rapid atrial fibrillation agrees to compliance with medications he is out of the diuretic we will restart continue beta-blocker and ACE inhibitor for now with elevated blood pressure initiate SGLT2 inhibitor spironolactone and I will reach out to EP for an opinion about admission to the hospital reinstitution of Tikosyn and cardioversion and see if normalized his ejection fraction is in sinus rhythm. Ultimately needs to deal with the sleep apnea and morbid obesity.  I suspect to do well with semaglutide. 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[facility])    High cholesterol    Hypertension    Hypertension, essential 08/08/2018   Hypertensive heart disease 09/01/2014   Joint pain    Mild CAD 10/16/2016   Morbid obesity with body mass index (BMI) of 45.0 to 49.9 in adult [facility]) 04/05/2016   Obstructive sleep apnea    awaiting treatment   On amiodarone therapy 10/16/2016   Palpitation    Paroxysmal atrial fibrillation ([place]) 12/12/2016   Persistent atrial fibrillation ([place]) 09/01/2014   Overview:  CHADS2=1   Sleep apnea    SOB (shortness of breath)     Past Surgical History:  Procedure Laterality Date   APPENDECTOMY     CARDIOVERSION     CARDIOVERSION N/A 12/14/2016   Procedure: CARDIOVERSION;  Surgeon: Lovena Le,  Doylene Canning, MD;  Location: [facility] INVASIVE CV LAB;  Service: Cardiovascular;  Laterality: N/A;   CARDIOVERSION N/A 08/15/2018   Procedure: CARDIOVERSION;  Surgeon: Thurmon Fair, MD;  Location: [facility] ENDOSCOPY;  Service: Cardiovascular;  Laterality: N/A;   CARDIOVERSION N/A 04/30/2020   Procedure: CARDIOVERSION;  Surgeon: Thurmon Fair, MD;  Location: [facility] ENDOSCOPY;  Service: Cardiovascular;  Laterality: N/A;   CARDIOVERSION N/A 06/02/2020   Procedure: CARDIOVERSION (CATH LAB);  Surgeon: Regan Lemming, MD;  Location[facility] INVASIVE CV LAB;  Service: Cardiovascular;  Laterality: N/A;   COLONOSCOPY  06/18/2001    Bx: Tubulovillous adenoma. Done by Dr. Charm Barges. He was told to come back 50yr later, but pt didnot.    ESOPHAGOGASTRODUODENOSCOPY  12/17/2014   mild gastritis. Small antral polyps (likely inflammatory).  Status post empiric esophageal dilatation   HERNIA REPAIR     umblical   KNEE SURGERY     SKIN GRAFT     URETHRA SURGERY      Current Medications: Current Meds  Medication Sig   diltiazem (CARDIZEM) 30 MG tablet Take 1 tablet (30 mg total) by mouth 3 (three) times daily.   lisinopril (ZESTRIL) 20 MG tablet Take 1 tablet (20 mg total) by mouth daily.   metoprolol tartrate (LOPRESSOR) 50 MG tablet Take 1 tablet (50 mg total) by mouth in the morning, at noon, and at bedtime.   nitroGLYCERIN (NITROSTAT) 0.4 MG SL tablet Place 0.4 mg under the tongue every 5 (five) minutes as needed for chest pain.   rivaroxaban (XARELTO) 20 MG TABS tablet Take 1 tablet (20 mg total) by mouth daily with supper.     Allergies:   Lipitor [atorvastatin]   Social History   Socioeconomic History   Marital status: Married    Spouse name: Not on file   Number of children: 1   Years of education: Not on file   Highest education level: Not on file  Occupational History   Occupation: Truck driver  Tobacco Use   Smoking status: Former    Types: Cigars   Smokeless tobacco: Never   Tobacco comments:    quit 7 years ago  Vaping Use   Vaping Use: Never used  Substance and Sexual Activity   Alcohol use: Yes    Comment: ocassionally   Drug use: No   Sexual activity: Not on file  Other Topics Concern   Not on file  Social History Narrative   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[place]   Social Determinants of Health   Financial Resource Strain: Not on file  Food Insecurity: Not on file  Transportation Needs: Not on file  Physical Activity: Not on file  Stress: Not on file  Social Connections: Not on file     Family History: The patient's family history includes Diabetes in his paternal grandfather; Heart disease in his father and paternal grandfather; Hypertension in his father and mother; Obesity in his mother. There is no history of Colon cancer or Esophageal cancer. ROS:   Please see the history of  present illness.    All other systems reviewed and are negative.  EKGs/Labs/Other Studies Reviewed:    The following studies were reviewed today:    Recent Labs: 02/14/2022: ALT 29; BUN 11; Creatinine, Ser 0.88; Hemoglobin 15.1; NT-Pro BNP 670; Platelets 279; Potassium 4.4; Sodium 141; TSH 3.320  Recent Lipid Panel    Component Value Date/Time   CHOL 165 05/27/2020 1209   TRIG 105 05/27/2020 1209   HDL 52 05/27/2020 1209  LDLCALC 94 05/27/2020 1209    Physical Exam:    VS:  BP (!) 128/98 (BP Location: Right Arm, Patient Position: Sitting, Cuff Size: Large)   Pulse 88   Ht 6' 2.5" (1.892 m)   Wt (!) 396 lb (179.6 kg)   SpO2 98%   BMI 50.16 kg/m     Wt Readings from Last 3 Encounters:  03/01/22 (!) 396 lb (179.6 kg)  02/14/22 (!) 402 lb (182.3 kg)  08/30/20 (!) 387 lb (175.5 kg)     GEN:  Well nourished, well developed in no acute distress HEENT: Normal NECK: No JVD; No carotid bruits LYMPHATICS: No lymphadenopathy CARDIAC: Irregular irregular rhythm no longer rapid RRR, no murmurs, rubs, gallops RESPIRATORY:  Clear to auscultation without rales, wheezing or rhonchi  ABDOMEN: Soft, non-tender, non-distended MUSCULOSKELETAL: Mild 1-2+ lower extremity pitting edema edema; No deformity  SKIN: Warm and dry NEUROLOGIC:  Alert and oriented x 3 PSYCHIATRIC:  Normal affect    Signed, Norman Herrlich, MD  03/01/2022 8:58 AM    [facility]

## 2022-03-01 ENCOUNTER — Ambulatory Visit: Payer: 59 | Attending: Cardiology | Admitting: Cardiology

## 2022-03-01 ENCOUNTER — Encounter: Payer: Self-pay | Admitting: Cardiology

## 2022-03-01 VITALS — BP 128/98 | HR 88 | Ht 74.5 in | Wt 396.0 lb

## 2022-03-01 DIAGNOSIS — I110 Hypertensive heart disease with heart failure: Secondary | ICD-10-CM

## 2022-03-01 DIAGNOSIS — Z7901 Long term (current) use of anticoagulants: Secondary | ICD-10-CM

## 2022-03-01 DIAGNOSIS — I420 Dilated cardiomyopathy: Secondary | ICD-10-CM | POA: Diagnosis not present

## 2022-03-01 DIAGNOSIS — I4819 Other persistent atrial fibrillation: Secondary | ICD-10-CM

## 2022-03-01 DIAGNOSIS — I5032 Chronic diastolic (congestive) heart failure: Secondary | ICD-10-CM

## 2022-03-01 DIAGNOSIS — G4733 Obstructive sleep apnea (adult) (pediatric): Secondary | ICD-10-CM

## 2022-03-01 MED ORDER — FUROSEMIDE 40 MG PO TABS: 40.0000 mg | ORAL_TABLET | Freq: Every day | ORAL | 3 refills | Status: DC

## 2022-03-01 MED ORDER — SPIRONOLACTONE 25 MG PO TABS: 25.0000 mg | ORAL_TABLET | Freq: Every day | ORAL | 5 refills | Status: DC

## 2022-03-01 MED ORDER — DAPAGLIFLOZIN PROPANEDIOL 10 MG PO TABS: 10.0000 mg | ORAL_TABLET | Freq: Every day | ORAL | 5 refills | Status: DC

## 2022-03-01 NOTE — Patient Instructions (Signed)
Medication Instructions:  Your physician has recommended you make the following change in your medication:  Re start your Furosemide 40 mg once daily Start Farxiga 10 mg once daily Start Spironolactone 25 mg once daily  *If you need a refill on your cardiac medications before your next appointment, please call your pharmacy*   Lab Work: NONE If you have labs (blood work) drawn today and your tests are completely normal, you will receive your results only by: MyChart Message (if you have MyChart) OR A paper copy in the mail If you have any lab test that is abnormal or we need to change your treatment, we will call you to review the results.   Testing/Procedures: NONE   Follow-Up: At [facility], you and your health needs are our priority.  As part of our continuing mission to provide you with exceptional heart care, we have created designated Provider Care Teams.  These Care Teams include your primary Cardiologist (physician) and Advanced Practice Providers (APPs -  Physician Assistants and Nurse Practitioners) who all work together to provide you with the care you need, when you need it.  We recommend signing up for the patient portal called "MyChart".  Sign up information is provided on this After Visit Summary.  MyChart is used to connect with patients for Virtual Visits (Telemedicine).  Patients are able to view lab/test results, encounter notes, upcoming appointments, etc.  Non-urgent messages can be sent to your provider as well.   To learn more about what you can do with MyChart, go to [email or web address].    Your next appointment:   4 week(s)  The format for your next appointment:   In Person  Provider:   Norman Herrlich, MD    Other Instructions  Dapagliflozin Tablets What is this medication? DAPAGLIFLOZIN (DAP a gli FLOE zin) treats type 2 diabetes. It works by helping your kidneys remove sugar (glucose) from your blood through the urine, which decreases  your blood sugar. It may also be used to lower the risk of worsening disease and death caused by kidney disease and heart failure. It works by helping your kidneys remove salt (sodium) from your blood through the urine. This decreases the amount of work the kidneys and heart have to do. Changes to diet and exercise are often combined with this medication. This medicine may be used for other purposes; ask your health care provider or pharmacist if you have questions. COMMON BRAND NAME(S): Marcelline Deist What should I tell my care team before I take this medication? They need to know if you have any of these conditions: Dehydration Diabetic ketoacidosis Diet low in salt Eating less due to illness, surgery, dieting, or any other reason Frequently drink alcohol Having surgery History of pancreatitis or pancreas problems History of yeast infection of the penis or vagina Infection in the bladder, kidneys, or urinary tract Kidney disease Low blood pressure On dialysis Problems urinating Type 1 diabetes Uncircumcised male An unusual or allergic reaction to dapagliflozin, other medications, foods, dyes, or preservatives Pregnant or trying to get pregnant Breast-feeding How should I use this medication? Take this medication by mouth with water. Take it as directed on the prescription label at the same time every day. You can take it with or without food. If it upsets your stomach, take it with food. Keep taking it unless your care team tells you to stop. A special MedGuide will be given to you by the pharmacist with each prescription and refill. Be sure to read this  information carefully each time. Talk to your care team about the use of this medication in children. Special care may be needed. Overdosage: If you think you have taken too much of this medicine contact a poison control center or emergency room at once. NOTE: This medicine is only for you. Do not share this medicine with others. What if I  miss a dose? If you miss a dose, take it as soon as you can. If it is almost time for your next dose, take only that dose. Do not take double or extra doses. What may interact with this medication? Lithium Sulfonylureas, such as glimepiride, glipizide, glyburide This list may not describe all possible interactions. Give your health care provider a list of all the medicines, herbs, non-prescription drugs, or dietary supplements you use. Also tell them if you smoke, drink alcohol, or use illegal drugs. Some items may interact with your medicine. What should I watch for while using this medication? Visit your care team for regular checks on your progress. Tell your care team if your symptoms do not start to get better or if they get worse. This medication can cause a serious condition in which there is too much acid in the blood. If you develop nausea, vomiting, stomach pain, unusual tiredness, or breathing problems, stop taking this medication and call your care team right away. If possible, use a ketone dipstick to check for ketones in your urine. Check with your care team if you have severe diarrhea, nausea, and vomiting, or if you sweat a lot. The loss of too much body fluid may make it dangerous for you to take this medication. A test called the HbA1C (A1C) will be monitored. This is a simple blood test. It measures your blood sugar control over the last 2 to 3 months. You will receive this test every 3 to 6 months. Learn how to check your blood sugar. Learn the symptoms of low and high blood sugar and how to manage them. Always carry a quick-source of sugar with you in case you have symptoms of low blood sugar. Examples include hard sugar candy or glucose tablets. Make sure others know that you can choke if you eat or drink when you develop serious symptoms of low blood sugar, such as seizures or unconsciousness. Get medical help at once. Tell your care team if you have high blood sugar. You might  need to change the dose of your medication. If you are sick or exercising more than usual, you may need to change the dose of your medication. Do not skip meals. Ask your care team if you should avoid alcohol. Many nonprescription cough and cold products contain sugar or alcohol. These can affect blood sugar. Wear a medical ID bracelet or chain. Carry a card that describes your condition. List the medications and doses you take on the card. What side effects may I notice from receiving this medication? Side effects that you should report to your care team as soon as possible: Allergic reactions--skin rash, itching, hives, swelling of the face, lips, tongue, or throat Dehydration--increased thirst, dry mouth, feeling faint or lightheaded, headache, dark yellow or brown urine Diabetic ketoacidosis (DKA)--increased thirst or amount of urine, dry mouth, fatigue, fruity odor to breath, trouble breathing, stomach pain, nausea, vomiting Genital yeast infection--redness, swelling, pain, or itchiness, odor, thick or lumpy discharge New pain or tenderness, change in skin color, sores or ulcers, infection of the leg or foot Infection or redness, swelling, tenderness, or pain in the  genitals, or area from the genitals to the back of the rectum Urinary tract infection (UTI)--burning when passing urine, passing frequent small amounts of urine, bloody or cloudy urine, pain in the lower back or sides This list may not describe all possible side effects. Call your doctor for medical advice about side effects. You may report side effects to FDA at 1-800-FDA-1088. Where should I keep my medication? Keep out of the reach of children and pets. Store at room temperature between 20 and 25 degrees C (68 and 77 degrees F). Get rid of any unused medication after the expiration date. To get rid of medications that are no longer needed or have expired: Take the medication to a medication take-back program. Check with your  pharmacy or law enforcement to find a location. If you cannot return the medication, check the label or package insert to see if the medication should be thrown out in the garbage or flushed down the toilet. If you are not sure, ask your care team. If it is safe to put it in the trash, take the medication out of the container. Mix the medication with cat litter, dirt, coffee grounds, or other unwanted substance. Seal the mixture in a bag or container. Put it in the trash. NOTE: This sheet is a summary. It may not cover all possible information. If you have questions about this medicine, talk to your doctor, pharmacist, or health care provider.  2023 Elsevier/Gold Standard (2020-05-12 00:00:00)  Spironolactone Tablets What is this medication? SPIRONOLACTONE (speer on [place] LAK tone) treats high blood pressure and heart failure. It may also be used to reduce swelling related to heart, kidney, or liver disease. It helps your kidneys remove more fluid and salt from your blood through the urine without losing too much potassium. It belongs to a group of medications called diuretics. This medicine may be used for other purposes; ask your health care provider or pharmacist if you have questions. COMMON BRAND NAME(S): Aldactone What should I tell my care team before I take this medication? They need to know if you have any of these conditions: Addison's disease or low adrenal gland function High blood level of potassium Kidney disease Liver disease An unusual or allergic reaction to spironolactone, other medications, foods, dyes, or preservatives Pregnant or trying to get pregnant Breast-feeding How should I use this medication? Take this medication by mouth. Take it as directed on the prescription label at the same time every day. You can take it with or without food. You should always take it the same way. Keep taking it unless your care team tells you to stop. Talk to your care team about the use of  this medication in children. Special care may be needed. Overdosage: If you think you have taken too much of this medicine contact a poison control center or emergency room at once. NOTE: This medicine is only for you. Do not share this medicine with others. What if I miss a dose? If you miss a dose, take it as soon as you can. If it is almost time for your next dose, take only that dose. Do not take double or extra doses. What may interact with this medication? Do not take this medication with any of the following: Cidofovir Eplerenone Tranylcypromine This medication may also interact with the following: Aspirin Certain medications for blood pressure or heart disease, such as benazepril, lisinopril, losartan, valsartan Certain medications that prevent or treat blood clots, such as heparin and enoxaparin Cholestyramine Cyclosporine  Digoxin Lithium Medications that relax muscles for surgery NSAIDs, medications for pain and inflammation, such as ibuprofen or naproxen Other diuretics Potassium salts or supplements Steroid medications, such as prednisone or cortisone Trimethoprim This list may not describe all possible interactions. Give your health care provider a list of all the medicines, herbs, non-prescription drugs, or dietary supplements you use. Also tell them if you smoke, drink alcohol, or use illegal drugs. Some items may interact with your medicine. What should I watch for while using this medication? Visit your care team for regular checks on your progress. Check your blood pressure as directed. Know what your blood pressure should be and when to contact your care team. Do not treat yourself for coughs, colds, or pain while you are using this medication without asking your care team for advice. Some medications may increase your blood pressure. Check with your care team if you have severe diarrhea, nausea, and vomiting, or if you sweat a lot. The loss of too much body fluid may  make it dangerous for you to take this medication. You may need to be on a special diet while you are taking this medication. Ask your care team. Also, find out how many glasses of fluids you need to drink each day. This medication may affect your coordination, reaction time, or judgment. Do not drive or operate machinery until you know how this medication affects you. Sit up or stand slowly to reduce the risk of dizzy or fainting spells. Drinking alcohol with this medication can increase the risk of these side effects. Avoid salt substitutes unless you are told otherwise by your care team. What side effects may I notice from receiving this medication? Side effects that you should report to your care team as soon as possible: Allergic reactions--skin rash, itching, hives, swelling of the face, lips, tongue, or throat Dehydration--increased thirst, dry mouth, feeling faint or lightheaded, headache, dark yellow or brown urine High potassium level--muscle weakness, fast or irregular heartbeat Kidney injury--decrease in the amount of urine, swelling of the ankles, hands, or feet Low blood pressure--dizziness, feeling faint or lightheaded, blurry vision Low sodium level--muscle weakness, fatigue, dizziness, headache, confusion Side effects that usually do not require medical attention (report to your care team if they continue or are bothersome): Breast pain or tenderness Changes in sex drive or performance Dizziness Headache Irregular menstrual cycles or spotting Unexpected breast tissue growth This list may not describe all possible side effects. Call your doctor for medical advice about side effects. You may report side effects to FDA at 1-800-FDA-1088. Where should I keep my medication? Keep out of the reach of children and pets. Store below 25 degrees C (77 degrees F). Get rid of any unused medication after the expiration date. To get rid of medications that are no longer needed or have  expired: Take the medication to a medication take-back program. Check with your pharmacy or law enforcement to find a location. If you cannot return the medication, check the label or package insert to see if the medication should be thrown out in the garbage or flushed down the toilet. If you are not sure, ask your care team. If it is safe to put into the trash, take the medication out of the container. Mix the medication with cat litter, dirt, coffee grounds, or other unwanted substance. Seal the mixture in a bag or container. Put it in the trash. NOTE: This sheet is a summary. It may not cover all possible information. If you have  questions about this medicine, talk to your doctor, pharmacist, or health care provider.  2023 Elsevier/Gold Standard (2020-06-10 00:00:00)   Important Information About Sugar

## 2022-03-15 ENCOUNTER — Telehealth: Payer: Self-pay | Admitting: Cardiology

## 2022-03-15 DIAGNOSIS — Z0279 Encounter for issue of other medical certificate: Secondary | ICD-10-CM

## 2022-03-15 NOTE — Telephone Encounter (Signed)
Patient is requesting to speak with Dr. Joya Gaskins nurse. He states when he last saw Dr. Bettina Gavia he advised him that he would refer him back to Dr. Curt Bears for a procedure, but he has not heard from anyone regarding this.

## 2022-03-15 NOTE — Telephone Encounter (Signed)
Patient brought in forms from [employer] to be filled out for disability. Forms are completed as of 03/15/22 and cash has been collected. Completed auth attached and all forms scanned in. Patient picked up forms to deliver personally  Thank you 03/15/2022 KBl

## 2022-03-16 NOTE — Telephone Encounter (Signed)
Patient came by the office and stated that he had dropped off the wrong forms before. He had already dropped off the correct forms which he took back to complete his portion so when Dr. Bettina Gavia completed the provider portion we could then fax the forms.

## 2022-03-20 ENCOUNTER — Telehealth: Payer: Self-pay | Admitting: Cardiology

## 2022-03-20 ENCOUNTER — Ambulatory Visit: Payer: 59 | Admitting: Cardiology

## 2022-03-20 NOTE — Telephone Encounter (Signed)
Pt would like a callback regarding appt this morning. Please advise

## 2022-03-20 NOTE — Telephone Encounter (Signed)
Called the patient and he reported that he had an appointment today at 10:30 am but he woke up today with a fever of 103 degrees and he did not want to come in to the office with a fever. He wanted to know if he could re-schedule his appointment or do a virtual appointment instead. I spoke to [facility], Dr. Curt Bears nurse, and she stated that she would call him and re-schedule his appointment.

## 2022-03-20 NOTE — Telephone Encounter (Signed)
Called patient and he was asking about the second round of paperwork that he had brought to the office for Dr. Bettina Gavia to complete. I explained that I had given it to Dr. Bettina Gavia for him to complete last week and I would research it to see if he had completed it. Patient was agreeable with this plan and had no further questions at this time.

## 2022-03-22 ENCOUNTER — Telehealth: Payer: Self-pay | Admitting: Cardiology

## 2022-03-22 NOTE — Telephone Encounter (Signed)
Called patient and informed him that Dr. Bettina Gavia had completed the provider part of his disability paperwork and I would fax it to [facility] today. Patient was appreciative for the call and had no further questions at this time.

## 2022-03-22 NOTE — Telephone Encounter (Signed)
Patient states he turned in another form for [employer] short term disability and he is requesting updates as soon as possible.

## 2022-03-22 NOTE — Telephone Encounter (Signed)
Called patient and informed him that Dr. Munley had completed the provider part of his disability paperwork and I would fax it to [employer] today. Patient was appreciative for the call and had no further questions at this time. 

## 2022-03-29 ENCOUNTER — Encounter: Payer: Self-pay | Admitting: Cardiology

## 2022-03-29 ENCOUNTER — Ambulatory Visit: Payer: 59 | Attending: Cardiology | Admitting: Cardiology

## 2022-03-29 VITALS — BP 114/74 | HR 72 | Ht 74.5 in | Wt 389.0 lb

## 2022-03-29 DIAGNOSIS — I5032 Chronic diastolic (congestive) heart failure: Secondary | ICD-10-CM

## 2022-03-29 DIAGNOSIS — Z7901 Long term (current) use of anticoagulants: Secondary | ICD-10-CM

## 2022-03-29 DIAGNOSIS — I4819 Other persistent atrial fibrillation: Secondary | ICD-10-CM

## 2022-03-29 DIAGNOSIS — I110 Hypertensive heart disease with heart failure: Secondary | ICD-10-CM | POA: Diagnosis not present

## 2022-03-29 NOTE — Progress Notes (Signed)
Cardiology Office Note:    Date:  03/29/2022   ID:  Jesus Barnes, DOB 04-19-1970, MRN [id number]  PCP:  Janine Limbo, PA-C  Cardiologist:  Shirlee More, MD    Referring MD: Janine Limbo, PA-C    ASSESSMENT:    1. Persistent atrial fibrillation ([place])   2. Chronic anticoagulation   3. Hypertensive heart disease with chronic diastolic congestive heart failure ([place])   4. Morbid obesity (HCC)    PLAN:    In order of problems listed above:  He has an appointment to see EP Monday for consideration of inpatient history of send Tikosyn cardioversion if ineffective I think the only thing we can offer him be AV nodal ablation and physiologic pacing Continue his anticoagulant He is markedly improved he is on good guideline directed therapy except he is not on Entresto financial limitations have been a problem and I am not going to switch him off an ACE inhibitor at this time He is quite committed to changing his lifestyle and is abstaining from alcohol committed to changing his lifestyle and is abstaining from alcohol following dietary restriction and following dietary restriction   Next appointment: 3 months   Medication Adjustments/Labs and Tests Ordered: Current medicines are reviewed at length with the patient today.  Concerns regarding medicines are outlined above.  No orders of the defined types were placed in this encounter.  No orders of the defined types were placed in this encounter.   Follow-up cardiomyopathy associated with arrhythmia   History of Present Illness:    Jesus Barnes is a 52 y.o. male with a hx of  paroxysmal atrial fibrillation with catheter ablation prior to 2018 chronic anticoagulation hypertensive heart disease with chronic diastolic heart failure precipitated by atrial fibrillation mild CAD last seen 03/01/2022.  His ejection fraction showed severe LV dysfunction EF in the range of 30 to 35% previously 55 to 60% in sinus rhythm on  Tikosyn.  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 and has recurrent severe left ventricular dysfunction associated with his atrial fibrillation I discussed with the EP we both feel that optimally he should be back in sinus rhythm and we will plan will be for him to be admitted to initiate Tikosyn and cardioverted if needed He feels much improved he is had some midday hypotension at home but is not having angina edema orthopnea or syncope Past Medical History:  Diagnosis Date   Anxiety 09/02/2014   Back pain    Burn    Chest pain    Chronic anticoagulation 10/16/2016   Chronic fatigue syndrome    Depression    Dilated cardiomyopathy ([place]) 10/16/2016   EF 30-35%, 05/15/16 40%, 07/17/18 55-60%   Dysphagia    Esophageal stricture 09/02/2014   Essential hypertension 09/01/2014   GERD (gastroesophageal reflux disease)    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[place])    High cholesterol    Hypertension    Hypertension, essential 08/08/2018   Hypertensive heart disease 09/01/2014   Joint pain    Mild CAD 10/16/2016   Morbid obesity with body mass index (BMI) of 45.0 to 49.9 in adult ([place]) 04/05/2016   Obstructive sleep apnea    awaiting treatment   On amiodarone therapy 10/16/2016   Palpitation    Paroxysmal atrial fibrillation ([place]) 12/12/2016   Persistent atrial fibrillation ([place])  09/01/2014   Overview:  CHADS2=1   Sleep apnea    SOB (shortness of breath)     Past Surgical History:  Procedure Laterality Date   APPENDECTOMY     CARDIOVERSION     CARDIOVERSION N/A 12/14/2016   Procedure: CARDIOVERSION;  Surgeon: Evans Lance, MD;  Location: [place] [facility];  Service: Cardiovascular;  Laterality: N/A;   CARDIOVERSION N/A 08/15/2018   Procedure: CARDIOVERSION;  Surgeon: Sanda Klein, MD;  Location: [facility]  ENDOSCOPY;  Service: Cardiovascular;  Laterality: N/A;   CARDIOVERSION N/A 04/30/2020   Procedure: CARDIOVERSION;  Surgeon: Sanda Klein, MD;  Location: [facility];  Service: Cardiovascular;  Laterality: N/A;   CARDIOVERSION N/A 06/02/2020   Procedure: CARDIOVERSION (CATH LAB);  Surgeon: Constance Haw, MD;  Location: [facility];  Service: Cardiovascular;  Laterality: N/A;   COLONOSCOPY  06/18/2001    Bx: Tubulovillous adenoma. Done by Dr. Melina Copa. He was told to come back 63yr later, but pt didnot.    ESOPHAGOGASTRODUODENOSCOPY  12/17/2014   mild gastritis. Small antral polyps (likely inflammatory). Status post empiric esophageal dilatation   HERNIA REPAIR     umblical   KNEE SURGERY     SKIN GRAFT     URETHRA SURGERY      Current Medications: Current Meds  Medication Sig   dapagliflozin propanediol (FARXIGA) 10 MG TABS tablet Take 1 tablet (10 mg total) by mouth daily before breakfast.   diltiazem (CARDIZEM) 30 MG tablet Take 1 tablet (30 mg total) by mouth 3 (three) times daily.   furosemide (LASIX) 40 MG tablet Take 1 tablet (40 mg total) by mouth daily.   lisinopril (ZESTRIL) 20 MG tablet Take 1 tablet (20 mg total) by mouth daily.   metoprolol tartrate (LOPRESSOR) 50 MG tablet Take 1 tablet (50 mg total) by mouth in the morning, at noon, and at bedtime.   nitroGLYCERIN (NITROSTAT) 0.4 MG SL tablet Place 0.4 mg under the tongue every 5 (five) minutes as needed for chest pain.   rivaroxaban (XARELTO) 20 MG TABS tablet Take 1 tablet (20 mg total) by mouth daily with supper.   spironolactone (ALDACTONE) 25 MG tablet Take 1 tablet (25 mg total) by mouth daily.     Allergies:   Lipitor [atorvastatin]   Social History   Socioeconomic History   Marital status: Married    Spouse name: Not on file   Number of children: 1   Years of education: Not on file   Highest education level: Not on file  Occupational History   Occupation: Truck driver  Tobacco Use   Smoking  status: Former    Types: Cigars   Smokeless tobacco: Never   Tobacco comments:    quit 7 years ago  Vaping Use   Vaping Use: Never used  Substance and Sexual Activity   Alcohol use: Yes    Comment: ocassionally   Drug use: No   Sexual activity: Not on file  Other Topics Concern   Not on file  Social History Narrative   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[place] Determinants of Health   Financial Resource Strain: Not on file  Food Insecurity: Not on file  Transportation Needs: Not on file  Physical Activity: Not on file  Stress: Not on file  Social Connections: Not on file     Family History: The patient's family history includes Diabetes in his paternal grandfather; Heart disease in his father and paternal grandfather; Hypertension in his father and mother;  Obesity in his mother. There is no history of Colon cancer or Esophageal cancer. ROS:   Please see the history of present illness.    All other systems reviewed and are negative.  EKGs/Labs/Other Studies Reviewed:    The following studies were reviewed today:  Echocardiogram performed 02/28/2022 shows severe LV dysfunction EF in the range of 30 to 35%. Previous EF 55-60% 07/16/2020. 1. Left ventricular ejection fraction, by estimation, is 30 to 35%. The  left ventricle has severely decreased function. The left ventricle  demonstrates global hypokinesis. The left ventricular internal cavity size  was mildly dilated. There is moderate  concentric left ventricular hypertrophy. Left ventricular diastolic  parameters are indeterminate.   2. Right ventricular systolic function is normal. The right ventricular  size is not well visualized.   3. Left atrial size was moderately dilated.   4. The mitral valve is normal in structure. No evidence of mitral valve  regurgitation. No evidence of mitral stenosis.   5. The aortic valve is tricuspid. Aortic valve regurgitation is not  visualized. No aortic stenosis is present.     Left heart cath 04/05/2016:  Diagnostic procedure: Left Heart Cath , Selective Coronaries, Left   Ventricular Injection  Complications: No Complications.  Conclusions  Diagnostic Procedure Summary  1. Mild Non-obstructive CAD  2. Mild LV dysfunction, LVEF 30-35%  Diagnostic Procedure Recommendations  1. Pt is in A fib with RVR at 120 BPM even after sedation. Will ask EP for  consult.  2. Echo to confirm LVEF.  I have reviewed the recent history and physical documentation. I personally  spent 20 minutes continuously monitoring the patient during the administration  of moderate sedation. Pre and post activities have been reviewed. I was  present for the entire procedure.   Signatures   Electronically signed by Holley Raring, MD(Diagnostic Physician) on   04/06/2016 13:50   Angiographic findings   Cardiac Arteries and Lesion Findings  LMCA: 0%.  LAD: 0%.  LCx: 0%.  RCA:   Lesion on Mid RCA: 10% stenosis.  Procedure Data  Procedure Date  Date: 04/06/2016 Start: 11:34  Admission Data  Admission Date: 04/06/2016   Coronary [place]   Dominance: Right  VA  LV function assessed OI:[facility].  Ejection Fraction    - Method: LV gram. EF%: 35.    Recent Labs: 02/14/2022: ALT 29; BUN 11; Creatinine, Ser 0.88; Hemoglobin 15.1; NT-Pro BNP 670; Platelets 279; Potassium 4.4; Sodium 141; TSH 3.320  Recent Lipid Panel    Component Value Date/Time   CHOL 165 05/27/2020 1209   TRIG 105 05/27/2020 1209   HDL 52 05/27/2020 1209   LDLCALC 94 05/27/2020 1209    Physical Exam:    VS:  BP 114/74 (BP Location: Right Arm, Patient Position: Sitting)   Pulse 72   Ht 6' 2.5" (1.892 m)   Wt (!) 389 lb (176.4 kg)   SpO2 99%   BMI 49.28 kg/m     Wt Readings from Last 3 Encounters:  03/29/22 (!) 389 lb (176.4 kg)  03/01/22 (!) 396 lb (179.6 kg)  02/14/22 (!) 402 lb (182.3 kg)     GEN: Dramatic appearance change he does not look short of breath at rest or acutely ill any longer well nourished,  well developed in no acute distress HEENT: Normal NECK: No JVD; No carotid bruits LYMPHATICS: No lymphadenopathy CARDIAC: Irregular rhythm variable first heart sound RRR,  RESPIRATORY:  Clear to auscultation without rales, wheezing or rhonchi  ABDOMEN: Soft, non-tender, non-distended  MUSCULOSKELETAL:  No edema; No deformity  SKIN: Warm and dry NEUROLOGIC:  Alert and oriented x 3 PSYCHIATRIC:  Normal affect    Signed, Shirlee More, MD  03/29/2022 1:08 PM    [facility]

## 2022-03-29 NOTE — Telephone Encounter (Signed)
Jesus Barnes from [employer] because she received a message back about her medical records request and wanted to speak to someone about it

## 2022-03-29 NOTE — Patient Instructions (Signed)
Medication Instructions:  Your physician recommends that you continue on your current medications as directed. Please refer to the Current Medication list given to you today.  *If you need a refill on your cardiac medications before your next appointment, please call your pharmacy*   Lab Work: None If you have labs (blood work) drawn today and your tests are completely normal, you will receive your results only by: MyChart Message (if you have MyChart) OR A paper copy in the mail If you have any lab test that is abnormal or we need to change your treatment, we will call you to review the results.   Testing/Procedures: None   Follow-Up: At [facility], you and your health needs are our priority.  As part of our continuing mission to provide you with exceptional heart care, we have created designated Provider Care Teams.  These Care Teams include your primary Cardiologist (physician) and Advanced Practice Providers (APPs -  Physician Assistants and Nurse Practitioners) who all work together to provide you with the care you need, when you need it.  We recommend signing up for the patient portal called "MyChart".  Sign up information is provided on this After Visit Summary.  MyChart is used to connect with patients for Virtual Visits (Telemedicine).  Patients are able to view lab/test results, encounter notes, upcoming appointments, etc.  Non-urgent messages can be sent to your provider as well.   To learn more about what you can do with MyChart, go to [email or web address].    Your next appointment:   3 month(s)  Provider:   Brian Munley, MD    Other Instructions None  

## 2022-03-30 ENCOUNTER — Encounter: Payer: Self-pay | Admitting: Cardiovascular Disease

## 2022-03-30 ENCOUNTER — Other Ambulatory Visit: Payer: Self-pay

## 2022-03-30 NOTE — Telephone Encounter (Signed)
Crystal with [employer] returning nurses call. Please advise

## 2022-03-30 NOTE — Telephone Encounter (Signed)
Called Crystal from [facility] and she requested a copy of the patient's latest EF and echo report, as well as a copy of the patient's last two office visit notes. These documents were printed out and faxed to [place] at [facility]. Crystal had no further questions at this time.

## 2022-03-30 NOTE — Telephone Encounter (Signed)
Left message for [facility], from [facility], to call back.

## 2022-03-30 NOTE — Telephone Encounter (Signed)
Error

## 2022-04-03 ENCOUNTER — Ambulatory Visit: Payer: 59 | Attending: Cardiology | Admitting: Cardiology

## 2022-04-03 ENCOUNTER — Encounter: Payer: Self-pay | Admitting: Cardiology

## 2022-04-03 ENCOUNTER — Telehealth: Payer: Self-pay | Admitting: Cardiology

## 2022-04-03 VITALS — BP 110/62 | HR 105 | Ht 74.5 in | Wt 385.6 lb

## 2022-04-03 DIAGNOSIS — I5022 Chronic systolic (congestive) heart failure: Secondary | ICD-10-CM | POA: Diagnosis not present

## 2022-04-03 DIAGNOSIS — I4811 Longstanding persistent atrial fibrillation: Secondary | ICD-10-CM

## 2022-04-03 DIAGNOSIS — D6869 Other thrombophilia: Secondary | ICD-10-CM

## 2022-04-03 DIAGNOSIS — G4733 Obstructive sleep apnea (adult) (pediatric): Secondary | ICD-10-CM

## 2022-04-03 MED ORDER — LISINOPRIL 10 MG PO TABS: 10.0000 mg | ORAL_TABLET | Freq: Every day | ORAL | 2 refills | Status: DC

## 2022-04-03 MED ORDER — METOPROLOL SUCCINATE ER 100 MG PO TB24: 100.0000 mg | ORAL_TABLET | Freq: Two times a day (BID) | ORAL | 3 refills | Status: DC

## 2022-04-03 NOTE — Patient Instructions (Addendum)
Medication Instructions:  Your physician has recommended you make the following change in your medication:  STOP Diltiazem STOP Metoprolol Tartrate (Lopressor) START Metoprolol Succinate (Toprol) 100 mg twice a day DECREASE Lisinopril to 10 mg daily  *If you need a refill on your cardiac medications before your next appointment, please call your pharmacy*   Lab Work: None ordered If you have labs (blood work) drawn today and your tests are completely normal, you will receive your results only by: [place] (if you have MyChart) OR A paper copy in the mail If you have any lab test that is abnormal or we need to change your treatment, we will call you to review the results.   Testing/Procedures: None ordered   Follow-Up: At [facility], you and your health needs are our priority.  As part of our continuing mission to provide you with exceptional heart care, we have created designated Provider Care Teams.  These Care Teams include your primary Cardiologist (physician) and Advanced Practice Providers (APPs -  Physician Assistants and Nurse Practitioners) who all work together to provide you with the care you need, when you need it.  Your next appointment:   3 month(s)  The format for your next appointment:   In Person  Provider:   Allegra Lai, MD   You have been referred to Dr. Radford Pax for your sleep apnea   Thank you for choosing [facility]!!   Trinidad Curet, RN [phone]  Other Instructions  Dofetilide (Tikosyn) Admission  Prior to day of admission: Check with drug insurance company for cost of drug to ensure affordability --- Dofetilide 500 mcg twice a day.  GoodRx is an option if insurance copay is unaffordable.  A pharmacist will review all your medications for potential interactions with Tikosyn. If any medication changes are needed prior to admission we will be in touch with you.  If any new medications are started AFTER your admission date is  set with [place] [phone]). Please notify our office immediately so your medication list can be updated and reviewed by our pharmacist again.  No Benadryl is allowed 3 days prior to admission.  Please ensure no missed doses of your anticoagulation (blood thinner) for 3 weeks prior to admission. If a dose is missed please notify our office immediately.  Tikosyn initiation requires a 3 night/4 day hospital stay with constant telemetry monitoring. You will have an EKG after each dose of Tikosyn as well as daily lab draws. On day of admission: [facility] office visit on the morning of admission is needed for preliminary labs/ekg.  You may bring personal belongings/clothing with you to the hospital. Please leave your suitcase in the car until you arrive in admissions.  Time of admission is dependent on bed availability in the hospital. In some instances, you will be sent home until bed is available. Rarely admission can be delayed to the following day if hospital census prevents available beds.  If the drug does not convert you to normal rhythm a cardioversion after the 4th dose of Tikosyn.  Questions please call our office at [phone]

## 2022-04-03 NOTE — Telephone Encounter (Signed)
Pt c/o medication issue:  1. Name of Medication:  Tikosyn   [phone]    2. How are you currently taking this medication (dosage and times per day)?   3. Are you having a reaction (difficulty breathing--STAT)?   4. What is your medication issue? Pt was told to c/b to inform Dr. Curt Bears how much this medication would cost. He states it wont be $10 a month after prior authorization.

## 2022-04-03 NOTE — Telephone Encounter (Addendum)
Pt aware I will forward this to our AFib clinic for adx to restart Tikosyn. Pt would like next available admission spot as he is trying to return to work once he is back in rhythm.  He is even willing to do a same day if someone cancels/falls off. Aware Marzetta Board, RN will contact him to arrange. Pt agreeable to plan.

## 2022-04-03 NOTE — Progress Notes (Signed)
Electrophysiology Office Note   Date:  04/03/2022   ID:  [name], DOB [birth year], MRN [id number]  PCP:  Eunice Blase, PA-C  Cardiologist:  Dulce Sellar Primary Electrophysiologist:  Ermine Spofford Jorja Loa, MD    Chief Complaint: AF   History of Present Illness: Jesus Barnes is a 52 y.o. male who is being seen today for the evaluation of AF at the request of [name], [name], PA-C. Presenting today for electrophysiology evaluation.  He has a history significant for atrial fibrillation, hypertension, coronary artery disease, morbid obesity, sleep apnea.  He has had multiple cardioversions in the past.  He is previously been on dofetilide.  He did go back into atrial fibrillation.  He initially developed a tachycardia mediated cardiomyopathy though this is improved.  His dofetilide was stopped as he went back into atrial fibrillation.  He was loaded on amiodarone and had a cardioversion 06/02/2020.  At that time, he was drinking quite a bit.  He switched to a lower alcohol beer, but had doubled the amount.  He has continued to have atrial fibrillation, but now his ejection fraction is low.  He feels quite poor with a significant amount of shortness of breath and fatigue.  Today, denies symptoms of palpitations, chest pain, orthopnea, PND, lower extremity edema, claudication, dizziness, presyncope, syncope, bleeding, or neurologic sequela. The patient is tolerating medications without difficulties.     Past Medical History:  Diagnosis Date   Anxiety 09/02/2014   Back pain    Burn    Chest pain    Chronic anticoagulation 10/16/2016   Chronic fatigue syndrome    Depression    Dilated cardiomyopathy (HCC) 10/16/2016   EF 30-35%, 05/15/16 40%, 07/17/18 55-60%   Dysphagia    Esophageal stricture 09/02/2014   Essential hypertension 09/01/2014   GERD (gastroesophageal reflux disease)    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HCC)    High cholesterol    Hypertension    Hypertension, essential 08/08/2018   Hypertensive  heart disease 09/01/2014   Joint pain    Mild CAD 10/16/2016   Morbid obesity with body mass index (BMI) of 45.0 to 49.9 in adult [facility] 04/05/2016   Obstructive sleep apnea    awaiting treatment   On amiodarone therapy 10/16/2016   Palpitation    Paroxysmal atrial fibrillation (HCC) 12/12/2016   Persistent atrial fibrillation (HCC) 09/01/2014   Overview:  CHADS2=1   Sleep apnea    SOB (shortness of breath)    Past Surgical History:  Procedure Laterality Date   APPENDECTOMY     CARDIOVERSION     CARDIOVERSION N/A 12/14/2016   Procedure: CARDIOVERSION;  Surgeon: Marinus Maw, MD;  [facility] INVAS[facility];  Service: Cardiovascular;  Laterality: N/A;   CARDIOVERSION N/A 08/15/2018   Procedure: CARDIOVERSION;  Surgeon: Thurmon Fair, MD;  Location: [facility] ENDOSCOPY;  Service: Cardiovascular;  Laterality: N/A;   CARDIOVERSION N/A 04/30/2020   Procedure: CARDIOVERSION;  Surgeon: Thurmon Fair, MD;  Location: [facility] ENDOSCOPY;  Service: Cardiovascular;  Laterality: N/A;   CARDIOVERSION N/A 06/02/2020   Procedure: CARDIOVERSION (CATH LAB);  Surgeon: Regan Lemming, MD;  Location[facility] INVASIVE CV LAB;  Service: Cardiovascular;  Laterality: N/A;   COLONOSCOPY  06/18/2001    Bx: Tubulovillous adenoma. Done by Dr. Charm Barges. He was told to come back 30yr later, but pt didnot.    ESOPHAGOGASTRODUODENOSCOPY  12/17/2014   mild gastritis. Small antral polyps (likely inflammatory). Status post empiric esophageal dilatation   HERNIA REPAIR     umblical   KNEE  SURGERY     SKIN GRAFT     URETHRA SURGERY       Current Outpatient Medications  Medication Sig Dispense Refill   dapagliflozin propanediol (FARXIGA) 10 MG TABS tablet Take 1 tablet (10 mg total) by mouth daily before breakfast. 30 tablet 5   furosemide (LASIX) 40 MG tablet Take 1 tablet (40 mg total) by mouth daily. 90 tablet 3   lisinopril (ZESTRIL) 10 MG tablet Take 1 tablet (10 mg total) by mouth daily. 90 tablet 2   metoprolol succinate  (TOPROL-XL) 100 MG 24 hr tablet Take 1 tablet (100 mg total) by mouth 2 (two) times daily. Take with or immediately following a meal. 60 tablet 3   nitroGLYCERIN (NITROSTAT) 0.4 MG SL tablet Place 0.4 mg under the tongue every 5 (five) minutes as needed for chest pain.     rivaroxaban (XARELTO) 20 MG TABS tablet Take 1 tablet (20 mg total) by mouth daily with supper. 90 tablet 1   spironolactone (ALDACTONE) 25 MG tablet Take 1 tablet (25 mg total) by mouth daily. 30 tablet 5   No current facility-administered medications for this visit.    Allergies:   Lipitor [atorvastatin]   Social History:  The patient  reports that he has quit smoking. His smoking use included cigars. He has never used smokeless tobacco. He reports current alcohol use. He reports that he does not use drugs.   Family History:  The patient's family history includes Diabetes in his paternal grandfather; Heart disease in his father and paternal grandfather; Hypertension in his father and mother; Obesity in his mother.   ROS:  Please see the history of present illness.   Otherwise, review of systems is positive for none.   All other systems are reviewed and negative.   PHYSICAL EXAM: VS:  BP 110/62   Pulse (!) 105   Ht 6' 2.5" (1.892 m)   Wt (!) 385 lb 9.6 oz (174.9 kg)   BMI 48.85 kg/m  , BMI Body mass index is 48.85 kg/m. GEN: Well nourished, well developed, in no acute distress  HEENT: normal  Neck: no JVD, carotid bruits, or masses Cardiac: tachycardic, irregular; no murmurs, rubs, or gallops,no edema  Respiratory:  clear to auscultation bilaterally, normal work of breathing GI: soft, nontender, nondistended, + BS MS: no deformity or atrophy  Skin: warm and dry Neuro:  Strength and sensation are intact Psych: euthymic mood, full affect  EKG:  EKG is ordered today. Personal review of the ekg ordered shows AF   Recent Labs: 02/14/2022: ALT 29; BUN 11; Creatinine, Ser 0.88; Hemoglobin 15.1; NT-Pro BNP 670;  Platelets 279; Potassium 4.4; Sodium 141; TSH 3.320    Lipid Panel     Component Value Date/Time   CHOL 165 05/27/2020 1209   TRIG 105 05/27/2020 1209   HDL 52 05/27/2020 1209   LDLCALC 94 05/27/2020 1209     Wt Readings from Last 3 Encounters:  04/03/22 (!) 385 lb 9.6 oz (174.9 kg)  03/29/22 (!) 389 lb (176.4 kg)  03/01/22 (!) 396 lb (179.6 kg)      Other studies Reviewed: Additional studies/ records that were reviewed today include: TTE 02/28/22 Review of the above records today demonstrates:   1. Left ventricular ejection fraction, by estimation, is 30 to 35%. The  left ventricle has severely decreased function. The left ventricle  demonstrates global hypokinesis. The left ventricular internal cavity size  was mildly dilated. There is moderate  concentric left ventricular hypertrophy. Left ventricular  diastolic  parameters are indeterminate.   2. Right ventricular systolic function is normal. The right ventricular  size is not well visualized.   3. Left atrial size was moderately dilated.   4. The mitral valve is normal in structure. No evidence of mitral valve  regurgitation. No evidence of mitral stenosis.   5. The aortic valve is tricuspid. Aortic valve regurgitation is not  visualized. No aortic stenosis is present.   6. There is mild dilatation of the aortic root and of the ascending  aorta, measuring 39 mm.   7. The inferior vena cava is normal in size with greater than 50%  respiratory variability, suggesting right atrial pressure of 3 mmHg.    ASSESSMENT AND PLAN:  1.  Longstanding persistent atrial fibrillation: Reports of an ablation in the past.  He had a cardioversion that we did not convert.  Left atrium is severely dilated.  Currently on metoprolol 50 mg twice daily, Xarelto 20 mg daily.  CHA2DS2-VASc of at least 3.  He feels quite poorly in atrial fibrillation.  He also has developed heart failure.  He was benefit from a rhythm control strategy for his  atrial fibrillation.  He did well previously on dofetilide, though he was noncompliant.  He states that he has stopped drinking and has a plan to be more compliant with the medication.  Lexii Walsh plan for dofetilide load.  2.  Morbid obesity: Lifestyle modification with diet and exercise encouraged Body mass index is 48.85 kg/m.  3.  Hypertension: well controlled  4.  Obstructive sleep apnea: Unable to tolerate CPAP.  Had previously refused CPAP.  Holden Draughon plan to refer to sleep medicine for further management.  He states that he is willing to use his CPAP.   5.  Secondary hypercoagulable state: Currently on Xarelto for atrial fibrillation as above  6.  Systolic heart failure: Ejection fraction 30 to 35% as of December 2023.  He feels quite poorly with fatigue and weakness.  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  It is vital that we get him back into normal rhythm.  Khaila Velarde plan for dofetilide load as above.  Adalin Vanderploeg stop his metoprolol and start him on Toprol-XL 100 mg twice daily.  His blood pressures have been low.  Moriah Loughry reduce his lisinopril to 10 mg daily.  Tashonna Descoteaux stop his diltiazem.  Discussed with primary cardiology  Current medicines are reviewed at length with the patient today.   The patient does not have concerns regarding his medicines.  The following changes were made today: start toprol xl, reduce lisinopril, stop metoprolol and diltiazem  Labs/ tests ordered today include:  Orders Placed This Encounter  Procedures   Ambulatory referral to Cardiology   EKG 12-Lead     Disposition:   FU 3 months  Signed, Decarla Siemen Meredith Leeds, MD  04/03/2022 1:44 PM     [facility] [address] [place] [address] [phone] (office) [phone] (fax)

## 2022-04-04 NOTE — Telephone Encounter (Signed)
Admission scheduled. Insurance requires preauthorization therefore admission scheduled for 2/6. Pt in agreement.

## 2022-04-10 ENCOUNTER — Telehealth: Payer: Self-pay | Admitting: Pharmacist

## 2022-04-10 NOTE — Telephone Encounter (Signed)
Medication list reviewed in anticipation of upcoming Tikosyn initiation. Patient is not taking any contraindicated or QTc prolonging medications.   Patient is anticoagulated on Xarelto 20mg daily on the appropriate dose. Please ensure that patient has not missed any anticoagulation doses in the 3 weeks prior to Tikosyn initiation.   Patient will need to be counseled to avoid use of Benadryl while on Tikosyn and in the 2-3 days prior to Tikosyn initiation.  

## 2022-04-14 ENCOUNTER — Encounter (HOSPITAL_COMMUNITY): Payer: Self-pay

## 2022-04-14 ENCOUNTER — Telehealth (HOSPITAL_COMMUNITY): Payer: Self-pay

## 2022-04-14 NOTE — Telephone Encounter (Signed)
Tikosyn admission for date of service- 04/18/2022 has been approved.  Authorization # [id number]

## 2022-04-18 ENCOUNTER — Ambulatory Visit (HOSPITAL_COMMUNITY)
Admission: RE | Admit: 2022-04-18 | Discharge: 2022-04-18 | Disposition: A | Discharge status: Home / Self Care | Payer: 59 | Source: Ambulatory Visit | Attending: Nurse Practitioner | Admitting: Nurse Practitioner

## 2022-04-18 ENCOUNTER — Inpatient Hospital Stay (HOSPITAL_COMMUNITY)
Admission: RE | Admit: 2022-04-18 | Discharge: 2022-04-21 | DRG: 309 | Disposition: A | Discharge status: Home / Self Care | Payer: 59 | Source: Ambulatory Visit | Attending: Cardiology | Admitting: Cardiology

## 2022-04-18 ENCOUNTER — Other Ambulatory Visit: Payer: Self-pay

## 2022-04-18 ENCOUNTER — Encounter (HOSPITAL_COMMUNITY): Payer: Self-pay | Admitting: Cardiology

## 2022-04-18 VITALS — BP 86/68 | HR 121 | Ht 74.5 in | Wt 380.2 lb

## 2022-04-18 DIAGNOSIS — I10 Essential (primary) hypertension: Secondary | ICD-10-CM | POA: Diagnosis not present

## 2022-04-18 DIAGNOSIS — G4733 Obstructive sleep apnea (adult) (pediatric): Secondary | ICD-10-CM | POA: Diagnosis present

## 2022-04-18 DIAGNOSIS — F419 Anxiety disorder, unspecified: Secondary | ICD-10-CM | POA: Diagnosis present

## 2022-04-18 DIAGNOSIS — Z6841 Body Mass Index (BMI) 40.0 and over, adult: Secondary | ICD-10-CM | POA: Diagnosis not present

## 2022-04-18 DIAGNOSIS — I420 Dilated cardiomyopathy: Secondary | ICD-10-CM | POA: Diagnosis present

## 2022-04-18 DIAGNOSIS — Z888 Allergy status to other drugs, medicaments and biological substances status: Secondary | ICD-10-CM

## 2022-04-18 DIAGNOSIS — I5022 Chronic systolic (congestive) heart failure: Secondary | ICD-10-CM | POA: Diagnosis present

## 2022-04-18 DIAGNOSIS — K219 Gastro-esophageal reflux disease without esophagitis: Secondary | ICD-10-CM | POA: Diagnosis present

## 2022-04-18 DIAGNOSIS — F32A Depression, unspecified: Secondary | ICD-10-CM | POA: Diagnosis present

## 2022-04-18 DIAGNOSIS — Z87891 Personal history of nicotine dependence: Secondary | ICD-10-CM | POA: Diagnosis not present

## 2022-04-18 DIAGNOSIS — I110 Hypertensive heart disease with heart failure: Secondary | ICD-10-CM | POA: Diagnosis present

## 2022-04-18 DIAGNOSIS — Z7901 Long term (current) use of anticoagulants: Secondary | ICD-10-CM | POA: Diagnosis not present

## 2022-04-18 DIAGNOSIS — G9332 Myalgic encephalomyelitis/chronic fatigue syndrome: Secondary | ICD-10-CM | POA: Diagnosis present

## 2022-04-18 DIAGNOSIS — I2510 Atherosclerotic heart disease of native coronary artery without angina pectoris: Secondary | ICD-10-CM | POA: Diagnosis present

## 2022-04-18 DIAGNOSIS — E7800 Pure hypercholesterolemia, unspecified: Secondary | ICD-10-CM | POA: Diagnosis present

## 2022-04-18 DIAGNOSIS — Z91199 Patient's noncompliance with other medical treatment and regimen due to unspecified reason: Secondary | ICD-10-CM

## 2022-04-18 DIAGNOSIS — I4811 Longstanding persistent atrial fibrillation: Principal | ICD-10-CM | POA: Diagnosis present

## 2022-04-18 DIAGNOSIS — Z8249 Family history of ischemic heart disease and other diseases of the circulatory system: Secondary | ICD-10-CM

## 2022-04-18 DIAGNOSIS — I480 Paroxysmal atrial fibrillation: Secondary | ICD-10-CM

## 2022-04-18 DIAGNOSIS — I4891 Unspecified atrial fibrillation: Secondary | ICD-10-CM

## 2022-04-18 DIAGNOSIS — I4819 Other persistent atrial fibrillation: Secondary | ICD-10-CM | POA: Diagnosis not present

## 2022-04-18 DIAGNOSIS — Z006 Encounter for examination for normal comparison and control in clinical research program: Secondary | ICD-10-CM

## 2022-04-18 HISTORY — DX: Unspecified atrial fibrillation: I48.91

## 2022-04-18 LAB — BASIC METABOLIC PANEL
Anion gap: 12 (ref 5–15)
BUN: 23 mg/dL — ABNORMAL HIGH (ref 6–20)
CO2: 26 mmol/L (ref 22–32)
Calcium: 9.2 mg/dL (ref 8.9–10.3)
Chloride: 101 mmol/L (ref 98–111)
Creatinine, Ser: 1.21 mg/dL (ref 0.61–1.24)
GFR, Estimated: >60 mL/min (ref >60)
Glucose, Bld: 113 mg/dL — ABNORMAL HIGH (ref 70–99)
Potassium: 4.3 mmol/L (ref 3.5–5.1)
Sodium: 139 mmol/L (ref 135–145)

## 2022-04-18 LAB — MAGNESIUM: Magnesium: 2.3 mg/dL (ref 1.7–2.4)

## 2022-04-18 MED ORDER — DAPAGLIFLOZIN PROPANEDIOL 10 MG PO TABS
10.0000 mg | ORAL_TABLET | Freq: Every day | ORAL | Status: DC
  Administered 2022-04-19 – 2022-04-21 (×3): 10 mg via ORAL
  Filled 2022-04-18 (×3): qty 1

## 2022-04-18 MED ORDER — DOFETILIDE 500 MCG PO CAPS
500.0000 ug | ORAL_CAPSULE | Freq: Two times a day (BID) | ORAL | Status: DC
  Administered 2022-04-18 – 2022-04-21 (×6): 500 ug via ORAL
  Filled 2022-04-18 (×6): qty 1

## 2022-04-18 MED ORDER — METOPROLOL SUCCINATE ER 100 MG PO TB24
100.0000 mg | ORAL_TABLET | Freq: Two times a day (BID) | ORAL | Status: DC
  Administered 2022-04-18 – 2022-04-21 (×6): 100 mg via ORAL
  Filled 2022-04-18 (×6): qty 1

## 2022-04-18 MED ORDER — FUROSEMIDE 40 MG PO TABS
40.0000 mg | ORAL_TABLET | Freq: Every day | ORAL | Status: DC
  Administered 2022-04-19 – 2022-04-21 (×3): 40 mg via ORAL
  Filled 2022-04-18 (×3): qty 1

## 2022-04-18 MED ORDER — SPIRONOLACTONE 25 MG PO TABS
25.0000 mg | ORAL_TABLET | Freq: Every day | ORAL | Status: DC
  Administered 2022-04-19 – 2022-04-21 (×3): 25 mg via ORAL
  Filled 2022-04-18 (×3): qty 1

## 2022-04-18 MED ORDER — SODIUM CHLORIDE 0.9% FLUSH
3.0000 mL | Freq: Two times a day (BID) | INTRAVENOUS | Status: DC
  Administered 2022-04-18 – 2022-04-20 (×5): 3 mL via INTRAVENOUS

## 2022-04-18 MED ORDER — LISINOPRIL 10 MG PO TABS
10.0000 mg | ORAL_TABLET | Freq: Every day | ORAL | Status: DC
  Administered 2022-04-19 – 2022-04-21 (×3): 10 mg via ORAL
  Filled 2022-04-18 (×3): qty 1

## 2022-04-18 MED ORDER — NITROGLYCERIN 0.4 MG SL SUBL: 0.4000 mg | SUBLINGUAL_TABLET | SUBLINGUAL | Status: DC | PRN

## 2022-04-18 MED ORDER — SODIUM CHLORIDE 0.9 % IV SOLN: 250.0000 mL | INTRAVENOUS | Status: DC | PRN

## 2022-04-18 MED ORDER — SODIUM CHLORIDE 0.9% FLUSH: 3.0000 mL | INTRAVENOUS | Status: DC | PRN

## 2022-04-18 MED ORDER — RIVAROXABAN 20 MG PO TABS
20.0000 mg | ORAL_TABLET | Freq: Every day | ORAL | Status: DC
  Administered 2022-04-18 – 2022-04-20 (×3): 20 mg via ORAL
  Filled 2022-04-18 (×3): qty 1

## 2022-04-18 NOTE — Care Management (Signed)
  Transition of Care [facility]) Screening Note   Patient Details  Name: Jesus Barnes Date of Birth: 07/30/1970   Transition of Care [facility]) CM/SW Contact:    Bethena Roys, RN Phone Number: 04/18/2022, 3:52 PM    Transition of Care Department [facility]) has reviewed the patient. Patient presented for Tikosyn Load. Benefits check submitted for cost. Case Manager will discuss cost and pharmacy of choice as the patient progresses.

## 2022-04-18 NOTE — Progress Notes (Signed)
Primary Care Physician: Janine Limbo, PA-C Referring Physician: Dr. Curt Bears Cardiologist: Dr. Jacqualine Code is a 52 y.o. male with a h/o dilated cardiomyopathy, HTN, persistent  afib in the past on Tikosyn but stopped due to being ineffective and noncompliance. He was then placed on amiodarone but cardioversion failed to convert pt and this was stopped March of 2022. It was thought that alcohol consumption was contributing to not being able to maintain SR as well.   He was seen by Dr. Curt Bears 04/03/22 and was found to have low EF and felt poorly. It was discussed with pt that he had developed tachycardia mediated cardiomyopathy and it was vital for him to be returned to [place]. He discussed repeating dofetilide load with admission. PT states he is committed to stop using alcohol and be compliant with Tikosyn this go round.  His lopressor was stopped and he was placed on Toprol XL 100 mg bid.   He is now here for Tikosyn admit. No benadryl use. No missed anticoagulation. He is feeling lightheaded this am and BP is soft around 90 systolic. He thinks he has gotten behind on his water intake. He is pending a sleep study in the near future. He has afib with RVR today.   Today, he denies symptoms of palpitations, chest pain, shortness of breath, orthopnea, PND, lower extremity edema, dizziness, presyncope, syncope, or neurologic sequela. The patient is tolerating medications without difficulties and is otherwise without complaint today.   Past Medical History:  Diagnosis Date   Anxiety 09/02/2014   Back pain    Burn    Chest pain    Chronic anticoagulation 10/16/2016   Chronic fatigue syndrome    Depression    Dilated cardiomyopathy ([place]) 10/16/2016   EF 30-35%, 05/15/16 40%, 07/17/18 55-60%   Dysphagia    Esophageal stricture 09/02/2014   Essential hypertension 09/01/2014   GERD (gastroesophageal reflux disease)    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HCC)    High cholesterol    Hypertension    Hypertension,  essential 08/08/2018   Hypertensive heart disease 09/01/2014   Joint pain    Mild CAD 10/16/2016   Morbid obesity with body mass index (BMI) of 45.0 to 49.9 in adult [facility]) 04/05/2016   Obstructive sleep apnea    awaiting treatment   On amiodarone therapy 10/16/2016   Palpitation    Paroxysmal atrial fibrillation ([place]) 12/12/2016   Persistent atrial fibrillation ([place]) 09/01/2014   Overview:  CHADS2=1   Sleep apnea    SOB (shortness of breath)    Past Surgical History:  Procedure Laterality Date   APPENDECTOMY     CARDIOVERSION     CARDIOVERSION N/A 12/14/2016   Procedure: CARDIOVERSION;  Surgeon: Evans Lance, MD;  Location: [place] CV LAB;  Service: Cardiovascular;  Laterality: N/A;   CARDIOVERSION N/A 08/15/2018   Procedure: CARDIOVERSION;  Surgeon: Sanda Klein, MD;  Location: [facility] ENDOSCOPY;  Service: Cardiovascular;  Laterality: N/A;   CARDIOVERSION N/A 04/30/2020   Procedure: CARDIOVERSION;  Surgeon: Sanda Klein, MD;  Location: [place];  Service: Cardiovascular;  Laterality: N/A;   CARDIOVERSION N/A 06/02/2020   Procedure: CARDIOVERSION (CATH LAB);  Surgeon: Constance Haw, MD;  Location: [place] CV LAB;  Service: Cardiovascular;  Laterality: N/A;   COLONOSCOPY  06/18/2001    Bx: Tubulovillous adenoma. Done by Dr. Melina Copa. He was told to come back 47yr later, but pt didnot.    ESOPHAGOGASTRODUODENOSCOPY  12/17/2014   mild gastritis. Small antral polyps (likely inflammatory).  Status post empiric esophageal dilatation   HERNIA REPAIR     umblical   KNEE SURGERY     SKIN GRAFT     URETHRA SURGERY      Current Outpatient Medications  Medication Sig Dispense Refill   dapagliflozin propanediol (FARXIGA) 10 MG TABS tablet Take 1 tablet (10 mg total) by mouth daily before breakfast. 30 tablet 5   furosemide (LASIX) 40 MG tablet Take 1 tablet (40 mg total) by mouth daily. 90 tablet 3   lisinopril (ZESTRIL) 10 MG tablet Take 1 tablet (10 mg total) by mouth daily. 90  tablet 2   metoprolol succinate (TOPROL-XL) 100 MG 24 hr tablet Take 1 tablet (100 mg total) by mouth 2 (two) times daily. Take with or immediately following a meal. 60 tablet 3   nitroGLYCERIN (NITROSTAT) 0.4 MG SL tablet Place 0.4 mg under the tongue every 5 (five) minutes as needed for chest pain.     rivaroxaban (XARELTO) 20 MG TABS tablet Take 1 tablet (20 mg total) by mouth daily with supper. 90 tablet 1   spironolactone (ALDACTONE) 25 MG tablet Take 1 tablet (25 mg total) by mouth daily. 30 tablet 5   No current facility-administered medications for this encounter.    Allergies  Allergen Reactions   Lipitor [Atorvastatin] Other (See Comments)    Muscle Pain    Social History   Socioeconomic History   Marital status: Married    Spouse name: Not on file   Number of children: 1   Years of education: Not on file   Highest education level: Not on file  Occupational History   Occupation: Truck driver  Tobacco Use   Smoking status: Former    Types: Cigars   Smokeless tobacco: Never   Tobacco comments:    quit 7 years ago  Vaping Use   Vaping Use: Never used  Substance and Sexual Activity   Alcohol use: Yes    Comment: ocassionally   Drug use: No   Sexual activity: Not on file  Other Topics Concern   Not on file  Social History Narrative   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[place] Determinants of Health   Financial Resource Strain: Not on file  Food Insecurity: Not on file  Transportation Needs: Not on file  Physical Activity: Not on file  Stress: Not on file  Social Connections: Not on file  Intimate Partner Violence: Not on file    Family History  Problem Relation Age of Onset   Diabetes Paternal Grandfather    Heart disease Paternal Grandfather    Hypertension Mother    Obesity Mother    Hypertension Father    Heart disease Father    Colon cancer Neg Hx    Esophageal cancer Neg Hx     ROS- All systems are reviewed and negative except as per the HPI  above  Physical Exam: Vitals:   04/18/22 0945  Height: 6' 2.5" (1.892 m)   Wt Readings from Last 3 Encounters:  04/03/22 (!) 174.9 kg  03/29/22 (!) 176.4 kg  03/01/22 (!) 179.6 kg    Labs: Lab Results  Component Value Date   NA 141 02/14/2022   K 4.4 02/14/2022   CL 103 02/14/2022   CO2 22 02/14/2022   GLUCOSE 87 02/14/2022   BUN 11 02/14/2022   CREATININE 0.88 02/14/2022   CALCIUM 9.1 02/14/2022   MG 1.9 07/11/2018   Lab Results  Component Value Date   INR 1.3 (  H) 06/02/2020   Lab Results  Component Value Date   CHOL 165 05/27/2020   HDL 52 05/27/2020   LDLCALC 94 05/27/2020   TRIG 105 05/27/2020     GEN- The patient is well appearing, alert and oriented x 3 today.   Head- normocephalic, atraumatic Eyes-  Sclera clear, conjunctiva pink Ears- hearing intact Oropharynx- clear Neck- supple, no JVP Lymph- no cervical lymphadenopathy Lungs- Clear to ausculation bilaterally, normal work of breathing Heart- rapid irregular rate and rhythm, no murmurs, rubs or gallops, PMI not laterally displaced GI- soft, NT, ND, + BS Extremities- no clubbing, cyanosis, or edema MS- no significant deformity or atrophy Skin- no rash or lesion Psych- euthymic mood, full affect Neuro- strength and sensation are intact  EKG-Vent. rate 121 BPM PR interval * ms QRS duration 98 ms QT/QTcB 354/502 ms P-R-T axes * -12 13 Atrial fibrillation with rapid ventricular response Minimal voltage criteria for LVH, may be normal variant ( R in aVL ) Inferior infarct , age undetermined Abnormal ECG When compared with ECG of 02-Jun-2020 14:58, PREVIOUS ECG IS PRESENT  Echo- see echo report in EPIC form 02/28/22 EF 30-35% with LA diameter of 5.00 and volume of 135.00 ml  Assessment and Plan:   1. Persistent afib  Pt failed Tikosyn in the past for noncompliance and amiodarone was stopped in 05/2020 for being refractory to afib  Many cardioversion's over the years  He has stopped alcohol  use x one month  Now with tachy mediated cardiomyopathy and Dr Curt Bears is admitting for tikosyn load States no missed xarelto 20 mg daily doses Qtc in rapid afib today is 502 ms but cannot determine if accurate.  Last qtc in SR was EKG from 01/06/19 at  472 ms while on Tikosyn Sleep study pending for snoring  No benadryl use  Bmet/mag  To 6 E when bed available  Code status discussed with patient and he wishes to be a full code   Butch Penny C. Denijah Karrer, [facility] [address] [place], [place] [address] [phone]

## 2022-04-18 NOTE — Plan of Care (Signed)
  Problem: Education: Goal: Knowledge of General Education information will improve Description Including pain rating scale, medication(s)/side effects and non-pharmacologic comfort measures Outcome: Progressing   Problem: Health Behavior/Discharge Planning: Goal: Ability to manage health-related needs will improve Outcome: Progressing   Problem: Clinical Measurements: Goal: Ability to maintain clinical measurements within normal limits will improve Outcome: Progressing Goal: Will remain free from infection Outcome: Progressing Goal: Diagnostic test results will improve Outcome: Progressing Goal: Respiratory complications will improve Outcome: Progressing Goal: Cardiovascular complication will be avoided Outcome: Progressing   Problem: Activity: Goal: Risk for activity intolerance will decrease Outcome: Progressing   Problem: Nutrition: Goal: Adequate nutrition will be maintained Outcome: Progressing   Problem: Coping: Goal: Level of anxiety will decrease Outcome: Progressing   Problem: Elimination: Goal: Will not experience complications related to bowel motility Outcome: Progressing Goal: Will not experience complications related to urinary retention Outcome: Progressing   Problem: Pain Managment: Goal: General experience of comfort will improve Outcome: Progressing   Problem: Safety: Goal: Ability to remain free from injury will improve Outcome: Progressing   Problem: Skin Integrity: Goal: Risk for impaired skin integrity will decrease Outcome: Progressing   Problem: Education: Goal: Knowledge of disease or condition will improve Outcome: Progressing Goal: Understanding of medication regimen will improve Outcome: Progressing Goal: Individualized Educational Video(s) Outcome: Progressing   Problem: Activity: Goal: Ability to tolerate increased activity will improve Outcome: Progressing   Problem: Cardiac: Goal: Ability to achieve and maintain  adequate cardiopulmonary perfusion will improve Outcome: Progressing   Problem: Health Behavior/Discharge Planning: Goal: Ability to safely manage health-related needs after discharge will improve Outcome: Progressing   

## 2022-04-18 NOTE — Research (Addendum)
[place] Informed Consent   Subject Name: Jesus Barnes  Subject met inclusion and exclusion criteria.  The informed consent form, study requirements and expectations were reviewed with the subject and questions and concerns were addressed prior to the signing of the consent form.  The subject verbalized understanding of the trial requirements.  The subject agreed to participate in the [place] trial and signed the informed consent on 6/Feb/2024.  The informed consent was obtained prior to performance of any protocol-specific procedures for the subject.  A copy of the signed informed consent was given to the subject and a copy was placed in the subject's medical record.   Tori Milks Gweneth Fredlund

## 2022-04-18 NOTE — Progress Notes (Addendum)
Pharmacy: Dofetilide (Tikosyn) - Initial Consult Assessment and Electrolyte Replacement  Pharmacy consulted to assist in monitoring and replacing electrolytes in this 52 y.o. male admitted on 04/18/2022 undergoing dofetilide initiation. First dofetilide dose: 04/18/22 at 2000.  Assessment:  Patient Exclusion Criteria: If any screening criteria checked as "Yes", then  patient  should NOT receive dofetilide until criteria item is corrected.  If "Yes" please indicate correction plan.  YES  NO Patient  Exclusion Criteria Correction Plan   [x]   []   Baseline QTc interval is greater than or equal to 440 msec. IF above YES box checked dofetilide contraindicated unless patient has ICD; then may proceed if QTc 500-550 msec or with known ventricular conduction abnormalities may proceed with QTc 550-600 msec. QTc =  502 Qtc in rapid afib today is 502 ms but cannot determine if accurate. EP aware and monitoring.   []   [x]   Patient is known or suspected to have a digoxin level greater than 2 ng/ml: No results found for: "DIGOXIN"     []   [x]   Creatinine clearance less than 20 ml/min (calculated using Cockcroft-Gault, actual body weight and serum creatinine): Estimated Creatinine Clearance: 117.5 mL/min (by C-G formula based on SCr of 1.21 mg/dL).     []   [x]  Patient has received drugs known to prolong the QT intervals within the last 48 hours (phenothiazines, tricyclics or tetracyclic antidepressants, erythromycin, H-1 antihistamines, cisapride, fluoroquinolones, azithromycin, ondansetron).   Updated information on QT prolonging agents is available to be searched on the following database:QT prolonging agents     []   [x]   Patient received a dose of hydrochlorothiazide (Oretic) alone or in any combination including triamterene (Dyazide, Maxzide) in the last 48 hours.    []   [x]  Patient received a medication known to increase dofetilide plasma concentrations prior to initial dofetilide dose:   Trimethoprim (Primsol, Proloprim) in the last 36 hours Verapamil (Calan, Verelan) in the last 36 hours or a sustained release dose in the last 72 hours Megestrol (Megace) in the last 5 days  Cimetidine (Tagamet) in the last 6 hours Ketoconazole (Nizoral) in the last 24 hours Itraconazole (Sporanox) in the last 48 hours  Prochlorperazine (Compazine) in the last 36 hours     []   [x]   Patient is known to have a history of torsades de pointes; congenital or acquired long QT syndromes.    []   [x]   Patient has received a Class 1 antiarrhythmic with less than 2 half-lives since last dose. (Disopyramide, Quinidine, Procainamide, Lidocaine, Mexiletine, Flecainide, Propafenone)    []   [x]   Patient has received amiodarone therapy in the past 3 months or amiodarone level is greater than 0.3 ng/ml.    Labs:    Component Value Date/Time   K 4.3 04/18/2022 1101   MG 2.3 04/18/2022 1101     Plan: Select One Calculated CrCl  Dose q12h  [x]  > 60 ml/min 500 mcg  []  40-60 ml/min 250 mcg  []  20-40 ml/min 125 mcg   [x]   Physician selected initial dose within range recommended for patients level of renal function - will monitor for response.  []   Physician selected initial dose outside of range recommended for patients level of renal function - will discuss if the dose should be altered at this time.   Patient has been appropriately anticoagulated with rivaroxaban.  Potassium: K >/= 4: Appropriate to initiate Tikosyn, no replacement needed    Magnesium: Mg >2: Appropriate to initiate Tikosyn, no replacement needed     Thank  you for allowing pharmacy to participate in this patient's care   Jeneen Rinks 10/17/7670  0:94 PM

## 2022-04-18 NOTE — H&P (Signed)
Primary Care Physician: Janine Limbo, PA-C Referring Physician: Dr. Curt Bears Cardiologist: Dr. Jacqualine Code is a 52 y.o. male with a h/o dilated cardiomyopathy, HTN, persistent  afib in the past on Tikosyn but stopped due to being ineffective and noncompliance. He was then placed on amiodarone but cardioversion failed to convert pt and this was stopped March of 2022. It was thought that alcohol consumption was contributing to not being able to maintain SR as well.   He was seen by Dr. Curt Bears 04/03/22 and was found to have low EF and felt poorly. It was discussed with pt that he had developed tachycardia mediated cardiomyopathy and it was vital for him to be returned to [facility]. He discussed repeating dofetilide load with admission. PT states he is committed to stop using alcohol and be compliant with Tikosyn this go round.  His lopressor was stopped and he was placed on Toprol XL 100 mg bid.   He is now here for Tikosyn admit. No benadryl use. No missed anticoagulation. He is feeling lightheaded this am and BP is soft around 90 systolic. He thinks he has gotten behind on his water intake. He is pending a sleep study in the near future. He has afib with RVR today.   Today, he denies symptoms of palpitations, chest pain, shortness of breath, orthopnea, PND, lower extremity edema, dizziness, presyncope, syncope, or neurologic sequela. The patient is tolerating medications without difficulties and is otherwise without complaint today.   Past Medical History:  Diagnosis Date   Anxiety 09/02/2014   Back pain    Burn    Chest pain    Chronic anticoagulation 10/16/2016   Chronic fatigue syndrome    Depression    Dilated cardiomyopathy ([place]) 10/16/2016   EF 30-35%, 05/15/16 40%, 07/17/18 55-60%   Dysphagia    Esophageal stricture 09/02/2014   Essential hypertension 09/01/2014   GERD (gastroesophageal reflux disease)    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HCC)    High cholesterol    Hypertension    Hypertension,  essential 08/08/2018   Hypertensive heart disease 09/01/2014   Joint pain    Mild CAD 10/16/2016   Morbid obesity with body mass index (BMI) of 45.0 to 49.9 in adult [facility]) 04/05/2016   Obstructive sleep apnea    awaiting treatment   On amiodarone therapy 10/16/2016   Palpitation    Paroxysmal atrial fibrillation ([place]) 12/12/2016   Persistent atrial fibrillation ([place]) 09/01/2014   Overview:  CHADS2=1   Sleep apnea    SOB (shortness of breath)    Past Surgical History:  Procedure Laterality Date   APPENDECTOMY     CARDIOVERSION     CARDIOVERSION N/A 12/14/2016   Procedure: CARDIOVERSION;  Surgeon: Evans Lance, MD;  Location: [facility];  Service: Cardiovascular;  Laterality: N/A;   CARDIOVERSION N/A 08/15/2018   Procedure: CARDIOVERSION;  Surgeon: Sanda Klein, MD;  Location: [facility];  Service: Cardiovascular;  Laterality: N/A;   CARDIOVERSION N/A 04/30/2020   Procedure: CARDIOVERSION;  Surgeon: Sanda Klein, MD;  Location: [facility];  Service: Cardiovascular;  Laterality: N/A;   CARDIOVERSION N/A 06/02/2020   Procedure: CARDIOVERSION (CATH LAB);  Surgeon: Constance Haw, MD;  Location: [facility];  Service: Cardiovascular;  Laterality: N/A;   COLONOSCOPY  06/18/2001    Bx: Tubulovillous adenoma. Done by Dr. Melina Copa. He was told to come back 39yr later, but pt didnot.    ESOPHAGOGASTRODUODENOSCOPY  12/17/2014   mild gastritis. Small antral polyps (likely inflammatory).  Status post empiric esophageal dilatation   HERNIA REPAIR     umblical   KNEE SURGERY     SKIN GRAFT     URETHRA SURGERY      Current Facility-Administered Medications  Medication Dose Route Frequency Provider Last Rate Last Admin   0.9 %  sodium chloride infusion  250 mL Intravenous PRN Newman Nip, NP       [START ON 04/19/2022] dapagliflozin propanediol (FARXIGA) tablet 10 mg  10 mg Oral Daily Newman Nip, NP       dofetilide (TIKOSYN) capsule 500 mcg  500 mcg Oral BID  Newman Nip, NP       [START ON 04/19/2022] furosemide (LASIX) tablet 40 mg  40 mg Oral Daily Newman Nip, NP       [START ON 04/19/2022] lisinopril (ZESTRIL) tablet 10 mg  10 mg Oral Daily Newman Nip, NP       metoprolol succinate (TOPROL-XL) 24 hr tablet 100 mg  100 mg Oral BID Newman Nip, NP       nitroGLYCERIN (NITROSTAT) SL tablet 0.4 mg  0.4 mg Sublingual Q5 min PRN Newman Nip, NP       rivaroxaban Carlena Hurl) tablet 20 mg  20 mg Oral Q supper Newman Nip, NP   20 mg at 04/18/22 1659   sodium chloride flush (NS) 0.9 % injection 3 mL  3 mL Intravenous Q12H Newman Nip, NP   3 mL at 04/18/22 1642   sodium chloride flush (NS) 0.9 % injection 3 mL  3 mL Intravenous PRN Newman Nip, NP       Melene Muller ON 04/19/2022] spironolactone (ALDACTONE) tablet 25 mg  25 mg Oral Daily Newman Nip, NP        Allergies  Allergen Reactions   Lipitor [Atorvastatin] Other (See Comments)    Muscle Pain    Social History   Socioeconomic History   Marital status: Married    Spouse name: Not on file   Number of children: 1   Years of education: Not on file   Highest education level: Not on file  Occupational History   Occupation: Truck driver  Tobacco Use   Smoking status: Former    Types: Cigars   Smokeless tobacco: Never   Tobacco comments:    quit 7 years ago  Vaping Use   Vaping Use: Never used  Substance and Sexual Activity   Alcohol use: Not Currently    Comment: ocassionally   Drug use: No   Sexual activity: Not on file  Other Topics Concern   Not on file  Social History Narrative   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[place]   Social Determinants of Health   Financial Resource Strain: Not on file  Food Insecurity: No Food Insecurity (04/18/2022)   Hunger Vital Sign    Worried About Running Out of Food in the Last Year: Never true    Ran Out of Food in the Last Year: Never true  Transportation Needs: No Transportation Needs (04/18/2022)   PRAPARE -  Administrator, Civil Service (Medical): No    Lack of Transportation (Non-Medical): No  Physical Activity: Not on file  Stress: Not on file  Social Connections: Not on file  Intimate Partner Violence: Not At Risk (04/18/2022)   Humiliation, Afraid, Rape, and Kick questionnaire    Fear of Current or Ex-Partner: No    Emotionally Abused: No    Physically Abused: No  Sexually Abused: No    Family History  Problem Relation Age of Onset   Diabetes Paternal Grandfather    Heart disease Paternal Grandfather    Hypertension Mother    Obesity Mother    Hypertension Father    Heart disease Father    Colon cancer Neg Hx    Esophageal cancer Neg Hx     ROS- All systems are reviewed and negative except as per the HPI above  Physical Exam: Vitals:   04/18/22 1437 04/18/22 1537 04/18/22 1538 04/18/22 1649  BP: (!) 77/64 100/72 100/72   Pulse: 66 66 68   Resp: 20 19 19    Temp: 97.8 F (36.6 C)     TempSrc: Oral     SpO2: 95%     Weight: (!) 169.2 kg     Height:    6' 2.5" (1.892 m)   Wt Readings from Last 3 Encounters:  04/18/22 (!) 169.2 kg  04/18/22 (!) 172.5 kg  04/03/22 (!) 174.9 kg    Labs: Lab Results  Component Value Date   NA 139 04/18/2022   K 4.3 04/18/2022   CL 101 04/18/2022   CO2 26 04/18/2022   GLUCOSE 113 (H) 04/18/2022   BUN 23 (H) 04/18/2022   CREATININE 1.21 04/18/2022   CALCIUM 9.2 04/18/2022   MG 2.3 04/18/2022   Lab Results  Component Value Date   INR 1.3 (H) 06/02/2020   Lab Results  Component Value Date   CHOL 165 05/27/2020   HDL 52 05/27/2020   LDLCALC 94 05/27/2020   TRIG 105 05/27/2020     GEN- The patient is well appearing, alert and oriented x 3 today.   Head- normocephalic, atraumatic Eyes-  Sclera clear, conjunctiva pink Ears- hearing intact Oropharynx- clear Neck- supple, no JVP Lymph- no cervical lymphadenopathy Lungs- Clear to ausculation bilaterally, normal work of breathing Heart- rapid irregular rate and  rhythm, no murmurs, rubs or gallops, PMI not laterally displaced GI- soft, NT, ND, + BS Extremities- no clubbing, cyanosis, or edema MS- no significant deformity or atrophy Skin- no rash or lesion Psych- euthymic mood, full affect Neuro- strength and sensation are intact  EKG-Vent. rate 121 BPM PR interval * ms QRS duration 98 ms QT/QTcB 354/502 ms P-R-T axes * -12 13 Atrial fibrillation with rapid ventricular response Minimal voltage criteria for LVH, may be normal variant ( R in aVL ) Inferior infarct , age undetermined Abnormal ECG When compared with ECG of 02-Jun-2020 14:58, PREVIOUS ECG IS PRESENT  Echo- see echo report in EPIC form 02/28/22 EF 30-35% with LA diameter of 5.00 and volume of 135.00 ml  Assessment and Plan:   1. Persistent afib  Pt failed Tikosyn in the past for noncompliance and amiodarone was stopped in 05/2020 for being refractory to afib  Many cardioversion's over the years  He has stopped alcohol use x one month  Now with tachy mediated cardiomyopathy and Dr Curt Bears is admitting for tikosyn load States no missed xarelto 20 mg daily doses Qtc in rapid afib today is 502 ms but cannot determine if accurate.  Last qtc in SR was EKG from 01/06/19 at  472 ms while on Tikosyn Sleep study pending for snoring  No benadryl use  Bmet/mag  To 6 E when bed available  Code status discussed with patient and he wishes to be a full code   Butch Penny C. Carroll, [facility] [address] [place], [place] [address] [phone]    I  have seen and examined this patient with Roderic Palau.  Agree with above, note added to reflect my findings.  Patient presents to the hospital with persistent atrial fibrillation for dofetilide load.  He feels weak and fatigued.  Aside from that he is in his usual state of health.  GEN: Well nourished, well developed, in no acute distress  HEENT: normal  Neck: no JVD, carotid bruits, or masses Cardiac:  Irregular; no murmurs, rubs, or gallops,no edema  Respiratory:  clear to auscultation bilaterally, normal work of breathing GI: soft, nontender, nondistended, + BS MS: no deformity or atrophy  Skin: warm and dry Neuro:  Strength and sensation are intact Psych: euthymic mood, full affect   Longstanding persistent atrial fibrillation: Admission for dofetilide load.  Left atrium is severely dilated.  CHA2DS2-VASc of 3.  Harald Quevedo continue Xarelto.  Start dofetilide 500 mcg.  Glennice Marcos check potassium and magnesium throughout the visit Systolic heart failure: Ejection fraction 30 to 35%.  Feels quite fatigued and weak.  Potentially a tachycardia mediated cardiomyopathy.  Continue home medications for heart failure. Morbid obesity: Lifestyle modification encouraged Struct of sleep apnea: Has had referral to sleep medicine.  Vina Byrd M. Benigna Delisi MD 04/18/2022 5:24 PM

## 2022-04-19 ENCOUNTER — Other Ambulatory Visit (HOSPITAL_COMMUNITY): Payer: Self-pay

## 2022-04-19 ENCOUNTER — Telehealth (HOSPITAL_COMMUNITY): Payer: Self-pay | Admitting: Pharmacy Technician

## 2022-04-19 DIAGNOSIS — I4819 Other persistent atrial fibrillation: Secondary | ICD-10-CM | POA: Diagnosis not present

## 2022-04-19 LAB — BASIC METABOLIC PANEL
Anion gap: 7 (ref 5–15)
BUN: 24 mg/dL — ABNORMAL HIGH (ref 6–20)
CO2: 23 mmol/L (ref 22–32)
Calcium: 8.7 mg/dL — ABNORMAL LOW (ref 8.9–10.3)
Chloride: 105 mmol/L (ref 98–111)
Creatinine, Ser: 0.95 mg/dL (ref 0.61–1.24)
GFR, Estimated: >60 mL/min (ref >60)
Glucose, Bld: 111 mg/dL — ABNORMAL HIGH (ref 70–99)
Potassium: 3.6 mmol/L (ref 3.5–5.1)
Sodium: 135 mmol/L (ref 135–145)

## 2022-04-19 LAB — MAGNESIUM: Magnesium: 2.2 mg/dL (ref 1.7–2.4)

## 2022-04-19 LAB — HIV ANTIBODY (ROUTINE TESTING W REFLEX): HIV Screen 4th Generation wRfx: NONREACTIVE

## 2022-04-19 MED ORDER — SODIUM CHLORIDE 0.9 % IV SOLN: INTRAVENOUS | Status: DC

## 2022-04-19 MED ORDER — MELATONIN 3 MG PO TABS
3.0000 mg | ORAL_TABLET | Freq: Every day | ORAL | Status: DC
  Administered 2022-04-19 – 2022-04-20 (×2): 3 mg via ORAL
  Filled 2022-04-19 (×2): qty 1

## 2022-04-19 MED ORDER — POTASSIUM CHLORIDE CRYS ER 20 MEQ PO TBCR
60.0000 meq | EXTENDED_RELEASE_TABLET | Freq: Once | ORAL | Status: AC
  Administered 2022-04-19: 60 meq via ORAL
  Filled 2022-04-19: qty 3

## 2022-04-19 NOTE — Plan of Care (Signed)
  Problem: Education: Goal: Knowledge of General Education information will improve Description Including pain rating scale, medication(s)/side effects and non-pharmacologic comfort measures Outcome: Progressing   Problem: Health Behavior/Discharge Planning: Goal: Ability to manage health-related needs will improve Outcome: Progressing   Problem: Clinical Measurements: Goal: Ability to maintain clinical measurements within normal limits will improve Outcome: Progressing Goal: Will remain free from infection Outcome: Progressing Goal: Diagnostic test results will improve Outcome: Progressing Goal: Respiratory complications will improve Outcome: Progressing Goal: Cardiovascular complication will be avoided Outcome: Progressing   Problem: Activity: Goal: Risk for activity intolerance will decrease Outcome: Progressing   Problem: Nutrition: Goal: Adequate nutrition will be maintained Outcome: Progressing   Problem: Coping: Goal: Level of anxiety will decrease Outcome: Progressing   Problem: Elimination: Goal: Will not experience complications related to bowel motility Outcome: Progressing Goal: Will not experience complications related to urinary retention Outcome: Progressing   Problem: Pain Managment: Goal: General experience of comfort will improve Outcome: Progressing   Problem: Safety: Goal: Ability to remain free from injury will improve Outcome: Progressing   Problem: Skin Integrity: Goal: Risk for impaired skin integrity will decrease Outcome: Progressing   Problem: Education: Goal: Knowledge of disease or condition will improve Outcome: Progressing Goal: Understanding of medication regimen will improve Outcome: Progressing Goal: Individualized Educational Video(s) Outcome: Progressing   Problem: Activity: Goal: Ability to tolerate increased activity will improve Outcome: Progressing   Problem: Cardiac: Goal: Ability to achieve and maintain  adequate cardiopulmonary perfusion will improve Outcome: Progressing   Problem: Health Behavior/Discharge Planning: Goal: Ability to safely manage health-related needs after discharge will improve Outcome: Progressing   

## 2022-04-19 NOTE — Care Management (Signed)
04-19-22 Case Manager spoke with the patient regarding co pay cost. Patient is agreeable to cost and would like to have the initial Rx sent to [facility] and the Rx refills 90 day supply escribed to [facility]. No further needs identified at this time.

## 2022-04-19 NOTE — Progress Notes (Signed)
Post dose EKG is reviewed with Dr. Curt Bears QTc appears stable/shorter then machine read. Continue same dose Tikosyn tonight  [name], PA-C

## 2022-04-19 NOTE — TOC Benefit Eligibility Note (Signed)
Patient Teacher, English as a foreign language completed.    The patient is currently admitted and upon discharge could be taking dofetlide (Tikosyn) 500 mcg capsules.  Prior Authorization Required  The patient is insured through [place], [place] Patient Advocate Specialist [place] Patient Advocate Team Direct Number: [phone]  Fax: [phone]

## 2022-04-19 NOTE — Telephone Encounter (Signed)
Patient Advocate Encounter   Received notification that prior authorization for Dofetilide 500MCG capsules is required.   PA submitted on 04/19/2022 Key [id number] Status is pending       Lyndel Safe, [place] Patient Advocate Specialist [place] Patient Advocate Team Direct Number: [phone]  Fax: [phone]

## 2022-04-19 NOTE — Progress Notes (Signed)
Pharmacy: Dofetilide (Tikosyn) - Follow Up Assessment and Electrolyte Replacement  Pharmacy consulted to assist in monitoring and replacing electrolytes in this 52 y.o. male admitted on 04/18/2022 undergoing dofetilide initiation. First dofetilide dose: 500 mcg on 2/6  Labs:    Component Value Date/Time   K 3.6 04/19/2022 0301   MG 2.2 04/19/2022 0301     Plan: Potassium: K 3.5-3.7:  Give KCl 60 mEq po x1   Magnesium: Mg > 2: No additional supplementation needed     Thank you for allowing pharmacy to participate in this patient's care    Marguerite Olea, [facility] Clinical Pharmacist  04/19/2022 7:24 AM   [facility] pharmacy phone numbers are listed on [email or web address]

## 2022-04-19 NOTE — Telephone Encounter (Signed)
Patient Advocate Encounter  Prior Authorization for Dofetilide 500MCG capsules  has been approved.    PA# [id number] Effective dates: 04/19/2022 through 04/20/2023  Patients co-pay is $10.00.     Lyndel Safe, [place] Patient Advocate Specialist [place] Patient Advocate Team Direct Number: [phone]  Fax: [phone]

## 2022-04-19 NOTE — Progress Notes (Addendum)
Rounding Note    Patient Name: Jesus Barnes Date of Encounter: 04/19/2022  [facility] Cardiologist: Shirlee More, MD   Subjective   Doing OK, hard to sleep here  Inpatient Medications    Scheduled Meds:  dapagliflozin propanediol  10 mg Oral Daily   dofetilide  500 mcg Oral BID   furosemide  40 mg Oral Daily   lisinopril  10 mg Oral Daily   metoprolol succinate  100 mg Oral BID   potassium chloride  60 mEq Oral Once   rivaroxaban  20 mg Oral Q supper   sodium chloride flush  3 mL Intravenous Q12H   spironolactone  25 mg Oral Daily   Continuous Infusions:  sodium chloride     PRN Meds: sodium chloride, nitroGLYCERIN, sodium chloride flush   Vital Signs    Vitals:   04/18/22 1649 04/18/22 1829 04/18/22 1957 04/19/22 0500  BP:  113/74 108/70 100/82  Pulse:   97 95  Resp:  20 18 18   Temp:   98 F (36.7 C) (!) 97.3 F (36.3 C)  TempSrc:   Oral Oral  SpO2:   99% 95%  Weight:      Height: 6' 2.5" (1.892 m)      No intake or output data in the 24 hours ending 04/19/22 0729    04/18/2022    2:37 PM 04/18/2022    9:45 AM 04/03/2022   12:11 PM  Last 3 Weights  Weight (lbs) 373 lb 380 lb 3.2 oz 385 lb 9.6 oz  Weight (kg) 169.192 kg 172.458 kg 174.907 kg      Telemetry    AFib 90's mostly - Personally Reviewed  ECG    AFib 114ms, machine read QTc 565, though appears much shorter to me  - Personally Reviewed  Physical Exam   GEN: No acute distress.   Neck: No JVD Cardiac: irreg-irreg, no murmurs, rubs, or gallops.  Respiratory: CTA b/l. GI: Soft, nontender, non-distended  MS: No edema; No deformity. Neuro:  Nonfocal  Psych: Normal affect   Labs    High Sensitivity Troponin:  No results for input(s): "TROPONINIHS" in the last 720 hours.   Chemistry Recent Labs  Lab 04/18/22 1101 04/19/22 0301  NA 139 135  K 4.3 3.6  CL 101 105  CO2 26 23  GLUCOSE 113* 111*  BUN 23* 24*  CREATININE 1.21 0.95  CALCIUM 9.2 8.7*  MG 2.3 2.2   GFRNONAA >60 >60  ANIONGAP 12 7    Lipids No results for input(s): "CHOL", "TRIG", "HDL", "LABVLDL", "LDLCALC", "CHOLHDL" in the last 168 hours.  HematologyNo results for input(s): "WBC", "RBC", "HGB", "HCT", "MCV", "MCH", "MCHC", "RDW", "PLT" in the last 168 hours. Thyroid No results for input(s): "TSH", "FREET4" in the last 168 hours.  BNPNo results for input(s): "BNP", "PROBNP" in the last 168 hours.  DDimer No results for input(s): "DDIMER" in the last 168 hours.   Radiology    No results found.  Cardiac Studies   Echo- see echo report in EPIC form 02/28/22 EF 30-35% with LA diameter of 5.00 and volume of 135.00 ml  Patient Profile     52 y.o. male w/PMHx of morbid obesity, HTN, HLD, DCM, ETOH abuse and AFib admitted for [place]    Persistent AFib CHA2DS2Vasc is 2, on Xarelto Tikosyn load is in progress K+ 3.6, replace as per [facility] Mag 2.2 Creat 0.95 (stable) EKG reviewed with MD, agrees, QTc is shorter, and stable  for his dose this morning  DCCV tomorrow if not in SR Pt aware and agreeable  HTN Home meds  DCM Home meds Compensated currently    For questions or updates, please contact [place] Please consult [email or web address] for contact info under        Signed, Baldwin Jamaica, PA-C  04/19/2022, 7:29 AM    I have seen and examined this patient with Jesus Barnes.  Agree with above, note added to reflect my findings.  Remains in atrial fibrillation.  Otherwise no major complaints.  GEN: Well nourished, well developed, in no acute distress  HEENT: normal  Neck: no JVD, carotid bruits, or masses Cardiac: Irregular, tachycardic; no murmurs, rubs, or gallops,no edema  Respiratory:  clear to auscultation bilaterally, normal work of breathing GI: soft, nontender, nondistended, + BS MS: no deformity or atrophy  Skin: warm and dry Neuro:  Strength and sensation are intact Psych: euthymic mood, full affect   Persistent atrial  fibrillation: CHA2DS2-VASc of 2.  Currently on Xarelto.  Dofetilide load in progress.  QTc remained stable.  Jackson Fetters replace potassium today.  N.p.o. tonight after midnight for likely cardioversion tomorrow. Hypertension: Currently well-controlled Chronic systolic heart failure: No obvious volume overload.  Continue with home medications.  Well compensated.  Dionicio Shelnutt M. Georgia Delsignore MD 04/19/2022 8:11 AM

## 2022-04-20 ENCOUNTER — Encounter (HOSPITAL_COMMUNITY): Payer: Self-pay | Admitting: *Deleted

## 2022-04-20 ENCOUNTER — Inpatient Hospital Stay (HOSPITAL_COMMUNITY): Payer: 59 | Admitting: Certified Registered Nurse Anesthetist

## 2022-04-20 ENCOUNTER — Encounter (HOSPITAL_COMMUNITY)
Admission: RE | Disposition: A | Discharge status: Home / Self Care | Payer: Self-pay | Source: Ambulatory Visit | Attending: Cardiology

## 2022-04-20 DIAGNOSIS — I10 Essential (primary) hypertension: Secondary | ICD-10-CM

## 2022-04-20 DIAGNOSIS — I2510 Atherosclerotic heart disease of native coronary artery without angina pectoris: Secondary | ICD-10-CM

## 2022-04-20 DIAGNOSIS — I4819 Other persistent atrial fibrillation: Secondary | ICD-10-CM | POA: Diagnosis not present

## 2022-04-20 DIAGNOSIS — Z87891 Personal history of nicotine dependence: Secondary | ICD-10-CM

## 2022-04-20 DIAGNOSIS — I4891 Unspecified atrial fibrillation: Secondary | ICD-10-CM | POA: Diagnosis not present

## 2022-04-20 HISTORY — PX: CARDIOVERSION: SHX1299

## 2022-04-20 LAB — BASIC METABOLIC PANEL
Anion gap: 11 (ref 5–15)
BUN: 18 mg/dL (ref 6–20)
CO2: 24 mmol/L (ref 22–32)
Calcium: 9 mg/dL (ref 8.9–10.3)
Chloride: 99 mmol/L (ref 98–111)
Creatinine, Ser: 0.82 mg/dL (ref 0.61–1.24)
GFR, Estimated: >60 mL/min (ref >60)
Glucose, Bld: 99 mg/dL (ref 70–99)
Potassium: 3.8 mmol/L (ref 3.5–5.1)
Sodium: 134 mmol/L — ABNORMAL LOW (ref 135–145)

## 2022-04-20 LAB — MAGNESIUM: Magnesium: 2.2 mg/dL (ref 1.7–2.4)

## 2022-04-20 SURGERY — CARDIOVERSION
Anesthesia: General

## 2022-04-20 MED ORDER — LIDOCAINE 2% (20 MG/ML) 5 ML SYRINGE
INTRAMUSCULAR | Status: DC | PRN
  Administered 2022-04-20: 60 mg via INTRAVENOUS

## 2022-04-20 MED ORDER — PROPOFOL 10 MG/ML IV BOLUS
INTRAVENOUS | Status: DC | PRN
  Administered 2022-04-20 (×2): 10 mg via INTRAVENOUS
  Administered 2022-04-20: 50 mg via INTRAVENOUS
  Administered 2022-04-20: 20 mg via INTRAVENOUS
  Administered 2022-04-20: 10 mg via INTRAVENOUS
  Administered 2022-04-20: 100 mg via INTRAVENOUS
  Administered 2022-04-20: 10 mg via INTRAVENOUS
  Administered 2022-04-20: 40 mg via INTRAVENOUS

## 2022-04-20 MED ORDER — HYDROCORTISONE 1 % EX CREA
TOPICAL_CREAM | Freq: Two times a day (BID) | CUTANEOUS | Status: DC
  Filled 2022-04-20: qty 28

## 2022-04-20 MED ORDER — HYDROCORTISONE 1 % EX CREA: 1.0000 | TOPICAL_CREAM | Freq: Two times a day (BID) | CUTANEOUS | Status: DC | PRN

## 2022-04-20 MED ORDER — SODIUM CHLORIDE 0.9 % IV SOLN
INTRAVENOUS | Status: DC | PRN
  Administered 2022-04-20: 500 mL via INTRAVENOUS

## 2022-04-20 MED ORDER — POTASSIUM CHLORIDE CRYS ER 20 MEQ PO TBCR
40.0000 meq | EXTENDED_RELEASE_TABLET | Freq: Once | ORAL | Status: AC
  Administered 2022-04-20: 40 meq via ORAL
  Filled 2022-04-20: qty 2

## 2022-04-20 NOTE — Anesthesia Postprocedure Evaluation (Signed)
Anesthesia Post Note  Patient: Jesus Barnes  Procedure(s) Performed: CARDIOVERSION     Patient location during evaluation: PACU Anesthesia Type: General Level of consciousness: awake and alert Pain management: pain level controlled Vital Signs Assessment: post-procedure vital signs reviewed and stable Respiratory status: spontaneous breathing, nonlabored ventilation and respiratory function stable Cardiovascular status: blood pressure returned to baseline Postop Assessment: no apparent nausea or vomiting Anesthetic complications: no   No notable events documented.  Last Vitals:  Vitals:   04/20/22 1356 04/20/22 1406  BP: (!) 89/66 94/72  Pulse: 65 65  Resp: 16 12  Temp:    SpO2: 99% 96%    Last Pain:  Vitals:   04/20/22 1406  TempSrc:   PainSc: 0-No pain                 Marthenia Rolling

## 2022-04-20 NOTE — Progress Notes (Signed)
Pharmacy: Dofetilide (Tikosyn) - Follow Up Assessment and Electrolyte Replacement  Pharmacy consulted to assist in monitoring and replacing electrolytes in this 52 y.o. male admitted on 04/18/2022 undergoing dofetilide initiation. First dofetilide dose: 04/18/2022 PM  Labs:    Component Value Date/Time   K 3.8 04/20/2022 0156   MG 2.2 04/20/2022 0156     Plan: Potassium: K 3.8-3.9:  Give KCl 40 mEq po x1   Magnesium: Mg > 2: No additional supplementation needed   Thank you for allowing pharmacy to participate in this patient's care   Antonietta Jewel, PharmD, [place] Pharmacist  Phone: [phone] 04/20/2022 7:22 AM  Please check AMION for all [place] phone numbers After 10:00 PM, call [place] [phone]

## 2022-04-20 NOTE — Interval H&P Note (Signed)
History and Physical Interval Note:  04/20/2022 1:28 PM  Jesus Barnes  has presented today for surgery, with the diagnosis of afib.  The various methods of treatment have been discussed with the patient and family. After consideration of risks, benefits and other options for treatment, the patient has consented to  Procedure(s): CARDIOVERSION (N/A) as a surgical intervention.  The patient's history has been reviewed, patient examined, no change in status, stable for surgery.  I have reviewed the patient's chart and labs.  Questions were answered to the patient's satisfaction.     [name]

## 2022-04-20 NOTE — Progress Notes (Signed)
Post dose EKG again difficult to assess/measure QT in AFib, though I get shorter then the machine, most consistently 360 > QTc 485 in AFib 108  Post DCCV EKG is reviewed SR 67bpm with manually measured QT 440ms/Qtc 465 (machine read 481)  Continue Tikosyn  [name], PA-C

## 2022-04-20 NOTE — Anesthesia Procedure Notes (Signed)
Procedure Name: General with mask airway Date/Time: 04/20/2022 1:31 PM  Performed by: Lowella Dell, CRNAPre-anesthesia Checklist: Patient identified, Emergency Drugs available, Suction available, Patient being monitored and Timeout performed Patient Re-evaluated:Patient Re-evaluated prior to induction Oxygen Delivery Method: Ambu bag Preoxygenation: Pre-oxygenation with 100% oxygen Induction Type: IV induction Ventilation: Mask ventilation without difficulty Placement Confirmation: positive ETCO2 Dental Injury: Teeth and Oropharynx as per pre-operative assessment

## 2022-04-20 NOTE — CV Procedure (Signed)
    CARDIOVERSION NOTE  Procedure: Electrical Cardioversion Indications:  Atrial Fibrillation  Procedure Details:  Consent: Risks of procedure as well as the alternatives and risks of each were explained to the (patient/caregiver).  Consent for procedure obtained.  Time Out: Verified patient identification, verified procedure, site/side was marked, verified correct patient position, special equipment/implants available, medications/allergies/relevent history reviewed, required imaging and test results available.  Performed  Patient placed on cardiac monitor, pulse oximetry, supplemental oxygen as necessary.  Sedation given:  propofol per anesthesia Pacer pads placed anterior and posterior chest.  Cardioverted  4  time(s).  Cardioverted at 200J biphasic x 3 with pressure, then 360J (physio-control) x 1.  Impression: Findings: Post procedure EKG shows: NSR Complications: None Patient did tolerate procedure well.  Plan: Ultimately successful DCCV after 3 failed shocks at 200J biphasic and then successful at 360J physio-control. Dr. Quentin Ore was present to assist with the procedure.  Time Spent Directly with the Patient:  30 minutes   [name], MD, [facility], [place] Director of the [facility] Lipid [facility] &  Cardiovascular Risk Reduction Clinic Diplomate of the American Board of Clinical Lipidology Attending Cardiologist  Direct Dial: [phone]  Fax: [phone]  Website:  [email or web address] 04/20/2022, 1:46 PM

## 2022-04-20 NOTE — Progress Notes (Addendum)
Rounding Note    Patient Name: Jesus Barnes Date of Encounter: 04/19/2022  [facility] Cardiologist: Shirlee More, MD   Subjective   Doing OK, hard to sleep here, doing well, tolerating drug  Inpatient Medications    Scheduled Meds:  dapagliflozin propanediol  10 mg Oral Daily   dofetilide  500 mcg Oral BID   furosemide  40 mg Oral Daily   lisinopril  10 mg Oral Daily   metoprolol succinate  100 mg Oral BID   potassium chloride  60 mEq Oral Once   rivaroxaban  20 mg Oral Q supper   sodium chloride flush  3 mL Intravenous Q12H   spironolactone  25 mg Oral Daily   Continuous Infusions:  sodium chloride     PRN Meds: sodium chloride, nitroGLYCERIN, sodium chloride flush   Vital Signs    Vitals:   04/18/22 1649 04/18/22 1829 04/18/22 1957 04/19/22 0500  BP:  113/74 108/70 100/82  Pulse:   97 95  Resp:  20 18 18   Temp:   98 F (36.7 C) (!) 97.3 F (36.3 C)  TempSrc:   Oral Oral  SpO2:   99% 95%  Weight:      Height: 6' 2.5" (1.892 m)      No intake or output data in the 24 hours ending 04/19/22 0729    04/18/2022    2:37 PM 04/18/2022    9:45 AM 04/03/2022   12:11 PM  Last 3 Weights  Weight (lbs) 373 lb 380 lb 3.2 oz 385 lb 9.6 oz  Weight (kg) 169.192 kg 172.458 kg 174.907 kg      Telemetry    AFib 90's mostly - Personally Reviewed  ECG    AFib 148ms, machine read QTc 539  - Personally Reviewed with Dr. Curt Bears, variable QT measurements  Physical Exam   No change in exam GEN: No acute distress.   Neck: No JVD Cardiac: irreg-irreg, no murmurs, rubs, or gallops.  Respiratory: CTA b/l. GI: Soft, nontender, non-distended  MS: No edema; No deformity. Neuro:  Nonfocal  Psych: Normal affect   Labs    High Sensitivity Troponin:  No results for input(s): "TROPONINIHS" in the last 720 hours.   Chemistry Recent Labs  Lab 04/18/22 1101 04/19/22 0301  NA 139 135  K 4.3 3.6  CL 101 105  CO2 26 23  GLUCOSE 113* 111*  BUN 23* 24*   CREATININE 1.21 0.95  CALCIUM 9.2 8.7*  MG 2.3 2.2  GFRNONAA >60 >60  ANIONGAP 12 7    Lipids No results for input(s): "CHOL", "TRIG", "HDL", "LABVLDL", "LDLCALC", "CHOLHDL" in the last 168 hours.  HematologyNo results for input(s): "WBC", "RBC", "HGB", "HCT", "MCV", "MCH", "MCHC", "RDW", "PLT" in the last 168 hours. Thyroid No results for input(s): "TSH", "FREET4" in the last 168 hours.  BNPNo results for input(s): "BNP", "PROBNP" in the last 168 hours.  DDimer No results for input(s): "DDIMER" in the last 168 hours.   Radiology    No results found.  Cardiac Studies   Echo- see echo report in EPIC form 02/28/22 EF 30-35% with LA diameter of 5.00 and volume of 135.00 ml  Patient Profile     52 y.o. male w/PMHx of morbid obesity, HTN, HLD, DCM, ETOH abuse and AFib admitted for tikosyn  Assessment & Plan    Persistent AFib CHA2DS2Vasc is 2, on Xarelto Tikosyn load is in progress K+ 3.8, replace as per [facility] Mag 2.2 Creat 0.82 (stable) EKG reviewed  with MD, very difficult to assess QT accurately, he was successfully on 528mcg historically, Lonn Im re-evaluate once back in SR Continue 588mcg this AM  DCCV today Pt aware and remains agreeable  HTN Home meds  DCM Home meds Compensated currently    For questions or updates, please contact [place] Please consult [email or web address] for contact info under        Signed, Baldwin Jamaica, PA-C  04/19/2022, 7:29 AM    I have seen and examined this patient with [name].  Agree with above, note added to reflect my findings.  Tolerating current dose of Tikosyn.  No current chest pain.  Remains mildly fatigued.  GEN: Well nourished, well developed, in no acute distress  HEENT: normal  Neck: no JVD, carotid bruits, or masses Cardiac: Irregular tachycardic ; no murmurs, rubs, or gallops,no edema  Respiratory:  clear to auscultation bilaterally, normal work of breathing GI: soft, nontender, nondistended, + BS MS:  no deformity or atrophy  Skin: warm and dry Neuro:  Strength and sensation are intact Psych: euthymic mood, full affect   Persistent atrial fibrillation: Dofetilide load in progress.  Continue Xarelto for CHA2DS2-VASc of 2.  Plan for cardioversion today. Hypertension: Continue home medications Chronic systolic heart failure: Likely due to a dilated cardiomyopathy.  Continue home medications.  Lola Czerwonka M. Nobel Brar MD 04/20/2022 10:29 AM

## 2022-04-20 NOTE — Transfer of Care (Signed)
Immediate Anesthesia Transfer of Care Note  Patient: Jesus Barnes  Procedure(s) Performed: CARDIOVERSION  Patient Location: PACU and Endoscopy Unit  Anesthesia Type:General  Level of Consciousness: drowsy and patient cooperative  Airway & Oxygen Therapy: Patient Spontanous Breathing and Patient connected to nasal cannula oxygen  Post-op Assessment: Post -op Vital signs reviewed and stable  Post vital signs: Reviewed and stable  Last Vitals:  Vitals Value Taken Time  BP 105/74   Temp    Pulse 66   Resp 22   SpO2 95     Last Pain:  Vitals:   04/20/22 1306  TempSrc: Temporal  PainSc: 0-No pain      Patients Stated Pain Goal: 0 (99/77/41 4239)  Complications: No notable events documented.

## 2022-04-20 NOTE — Anesthesia Preprocedure Evaluation (Addendum)
Anesthesia Evaluation  Patient identified by MRN, date of birth, ID band Patient awake    Reviewed: Allergy & Precautions, NPO status , Patient's Chart, lab work & pertinent test results, reviewed documented beta blocker date and time   History of Anesthesia Complications Negative for: history of anesthetic complications  Airway Mallampati: II  TM Distance: >3 FB Neck ROM: Full    Dental no notable dental hx.    Pulmonary sleep apnea , former smoker   Pulmonary exam normal        Cardiovascular hypertension, Pt. on medications and Pt. on home beta blockers + CAD  Normal cardiovascular exam+ dysrhythmias Atrial Fibrillation   TTE 02/2022: EF 30-35%, global hypokinesis, moderate LVH, moderate LAE, mild dilatation of aortic root and ascending aorta measuring 42mm   Neuro/Psych   Anxiety Depression    negative neurological ROS     GI/Hepatic Neg liver ROS,GERD  ,,  Endo/Other    Morbid obesity  Renal/GU negative Renal ROS  negative genitourinary   Musculoskeletal negative musculoskeletal ROS (+)    Abdominal   Peds  Hematology negative hematology ROS (+)   Anesthesia Other Findings Day of surgery medications reviewed with patient.  Reproductive/Obstetrics negative OB ROS                             Anesthesia Physical Anesthesia Plan  ASA: 4  Anesthesia Plan: General   Post-op Pain Management: Minimal or no pain anticipated   Induction: Intravenous  PONV Risk Score and Plan: 2 and Treatment may vary due to age or medical condition and Propofol infusion  Airway Management Planned: Mask  Additional Equipment: None  Intra-op Plan:   Post-operative Plan:   Informed Consent: I have reviewed the patients History and Physical, chart, labs and discussed the procedure including the risks, benefits and alternatives for the proposed anesthesia with the patient or authorized representative  who has indicated his/her understanding and acceptance.     Dental advisory given  Plan Discussed with: CRNA  Anesthesia Plan Comments:         Anesthesia Quick Evaluation

## 2022-04-20 NOTE — Progress Notes (Signed)
   04/20/22 1046  Assess: MEWS Score  BP 103/74  Pulse Rate (!) 118  Level of Consciousness Alert  Assess: MEWS Score  MEWS Temp 0  MEWS Systolic 0  MEWS Pulse 2  MEWS RR 0  MEWS LOC 0  MEWS Score 2  MEWS Score Color Yellow  Assess: if the MEWS score is Yellow or Red  Were vital signs taken at a resting state? Yes  Focused Assessment Change from prior assessment (see assessment flowsheet)  Does the patient meet 2 or more of the SIRS criteria? No  Does the patient have a confirmed or suspected source of infection? No  Provider and Rapid Response Notified? No  MEWS guidelines implemented  Yes, yellow  Treat  MEWS Interventions Considered administering scheduled or prn medications/treatments as ordered  Take Vital Signs  Increase Vital Sign Frequency  Yellow: Q2hr x1, continue Q4hrs until patient remains green for 12hrs  Escalate  MEWS: Escalate Yellow: Discuss with charge nurse and consider notifying provider and/or RRT  Notify: Charge Nurse/RN  Name of Charge Nurse/RN Notified Janett Billow Affan Callow rn  Provider Notification  Provider Name/Title renee Charlcie Cradle pa  Date Provider Notified 04/20/22  Time Provider Notified 1133  Method of Notification Page (secure chat sent)  Notification Reason Other (Comment) (change in mews color)  Provider response No new orders  Date of Provider Response 04/20/22  Time of Provider Response 1134  Assess: SIRS CRITERIA  SIRS Temperature  0  SIRS Pulse 1  SIRS Respirations  0  SIRS WBC 0  SIRS Score Sum  1

## 2022-04-21 ENCOUNTER — Other Ambulatory Visit (HOSPITAL_COMMUNITY): Payer: Self-pay

## 2022-04-21 DIAGNOSIS — I4819 Other persistent atrial fibrillation: Secondary | ICD-10-CM | POA: Diagnosis not present

## 2022-04-21 LAB — MAGNESIUM: Magnesium: 2.1 mg/dL (ref 1.7–2.4)

## 2022-04-21 LAB — BASIC METABOLIC PANEL
Anion gap: 12 (ref 5–15)
BUN: 21 mg/dL — ABNORMAL HIGH (ref 6–20)
CO2: 23 mmol/L (ref 22–32)
Calcium: 9 mg/dL (ref 8.9–10.3)
Chloride: 97 mmol/L — ABNORMAL LOW (ref 98–111)
Creatinine, Ser: 0.91 mg/dL (ref 0.61–1.24)
GFR, Estimated: >60 mL/min (ref >60)
Glucose, Bld: 104 mg/dL — ABNORMAL HIGH (ref 70–99)
Potassium: 4 mmol/L (ref 3.5–5.1)
Sodium: 132 mmol/L — ABNORMAL LOW (ref 135–145)

## 2022-04-21 MED ORDER — DOFETILIDE 500 MCG PO CAPS
500.0000 ug | ORAL_CAPSULE | Freq: Two times a day (BID) | ORAL | 5 refills | Status: DC
  Filled 2022-04-21: qty 60, 30d supply, fill #0
  Filled 2022-05-19: qty 60, 30d supply, fill #1
  Filled 2022-06-21: qty 60, 30d supply, fill #2

## 2022-04-21 MED ORDER — POTASSIUM CHLORIDE CRYS ER 20 MEQ PO TBCR
20.0000 meq | EXTENDED_RELEASE_TABLET | Freq: Every day | ORAL | 5 refills | Status: DC
  Filled 2022-04-21: qty 30, 30d supply, fill #0
  Filled 2022-05-19: qty 30, 30d supply, fill #1
  Filled 2022-06-21: qty 30, 30d supply, fill #2

## 2022-04-21 NOTE — Progress Notes (Addendum)
Pharmacy: Dofetilide (Tikosyn) - Follow Up Assessment and Electrolyte Replacement  Pharmacy consulted to assist in monitoring and replacing electrolytes in this 53 y.o. male admitted on 04/18/2022 undergoing dofetilide initiation. First dofetilide dose: 04/18/2022 PM  Labs:    Component Value Date/Time   K 4.0 04/21/2022 0241   MG 2.1 04/21/2022 0241     Plan: Potassium: K >/= 4: No additional supplementation needed  Magnesium: Mg > 2: No additional supplementation needed  As patient has required on average 25 mEq of potassium replacement every day, recommend discharging patient with prescription for:  Potassium chloride 20 mEq  daily  Thank you for allowing pharmacy to participate in this patient's care   Antonietta Jewel, PharmD, [facility] Pharmacist  Phone: [phone] 04/21/2022 7:23 AM  Please check AMION for all [place] phone numbers After 10:00 PM, call [place] [phone]

## 2022-04-21 NOTE — Discharge Summary (Addendum)
ELECTROPHYSIOLOGY PROCEDURE DISCHARGE SUMMARY    Patient ID: Jesus Barnes,  MRN: [id number], DOB/AGE: Nov 10, 1970 52 y.o.  Admit date: 04/18/2022 Discharge date: 04/21/2022  Primary Care Physician: Jesus Limbo, PA-C  Primary Cardiologist: Dr. Bettina Barnes Electrophysiologist: Dr. Curt Barnes  Primary Discharge Diagnosis:  1.  persistent atrial fibrillation status post Tikosyn loading this admission      CHA2DS2Vasc is 2, on Xarelto  Secondary Discharge Diagnosis:  HTN DCM  Allergies  Allergen Reactions   Lipitor [Atorvastatin] Other (See Comments)    Muscle Pain     Procedures This Admission:  1.  Tikosyn loading 2.  Direct current cardioversion on 04/20/22 by Drs. Hilty/Lambert which successfully restored SR.  There were no early apparent complications.   Brief HPI: Jesus Barnes is a 52 y.o. male with a past medical history as noted above.  He is followed by EP.  Previously on Tikosyn though pt self stopped, planned to [facility] and pursue AAD management again.  Risks, benefits, and alternatives to Tikosyn were reviewed with the patient who wished to proceed.    Hospital Course:  The patient was admitted and Tikosyn was initiated.  Renal function and electrolytes were followed during the hospitalization.  The patient's QTc remained stable.  On 04/20/22 the patient underwent direct current cardioversion which restored sinus rhythm.  He was monitored until discharge on telemetry which demonstrated AFib > SR.  On the day of discharge, he feels well, was examined by Dr Jesus Barnes who considered the patient stable for discharge to home.  Follow-up has been arranged with the AFib clinic in 1 week and with EP team in 4 weeks.    Tikosyn teaching was completed electrolyte replacement for home Jesus Barnes be Kdur 65mq daily   Physical Exam: Vitals:   04/20/22 1428 04/20/22 2022 04/21/22 0532 04/21/22 0900  BP: 97/61 (!) 104/57 97/65 (!) 97/59  Pulse: 66 63 61   Resp: 14  16   Temp: (!) 97.5  F (36.4 C) 98.1 F (36.7 C) 98.5 F (36.9 C)   TempSrc: Oral Oral Oral   SpO2: 96% 96% 94%   Weight:      Height:         GEN- The patient is well appearing, alert and oriented x 3 today.   HEENT: normocephalic, atraumatic; sclera clear, conjunctiva pink; hearing intact; oropharynx clear; neck supple, no JVP Lymph- no cervical lymphadenopathy Lungs-  CTA b/l, normal work of breathing.  No wheezes, rales, rhonchi Heart-  RRR, no murmurs, rubs or gallops, PMI not laterally displaced GI- soft, non-tender, non-distended Extremities- no clubbing, cyanosis, or edema MS- no significant deformity or atrophy Skin- warm and dry, no rash or lesion Psych- euthymic mood, full affect Neuro- strength and sensation are intact   Labs:   Lab Results  Component Value Date   WBC 7.7 02/14/2022   HGB 15.1 02/14/2022   HCT 46.1 02/14/2022   MCV 89 02/14/2022   PLT 279 02/14/2022    Recent Labs  Lab 04/21/22 0241  NA 132*  K 4.0  CL 97*  CO2 23  BUN 21*  CREATININE 0.91  CALCIUM 9.0  GLUCOSE 104*     Discharge Medications:  Allergies as of 04/21/2022       Reactions   Lipitor [atorvastatin] Other (See Comments)   Muscle Pain        Medication List     TAKE these medications    dapagliflozin propanediol 10 MG Tabs tablet Commonly known as: FIran  Take 1 tablet (10 mg total) by mouth daily before breakfast.   dofetilide 500 MCG capsule Commonly known as: TIKOSYN Take 1 capsule (500 mcg total) by mouth 2 (two) times daily.   furosemide 40 MG tablet Commonly known as: LASIX Take 1 tablet (40 mg total) by mouth daily.   lisinopril 10 MG tablet Commonly known as: ZESTRIL Take 1 tablet (10 mg total) by mouth daily.   metoprolol succinate 100 MG 24 hr tablet Commonly known as: TOPROL-XL Take 1 tablet (100 mg total) by mouth 2 (two) times daily. Take with or immediately following a meal.   nitroGLYCERIN 0.4 MG SL tablet Commonly known as: NITROSTAT Place 0.4 mg  under the tongue every 5 (five) minutes as needed for chest pain.   potassium chloride SA 20 MEQ tablet Commonly known as: KLOR-CON M Take 1 tablet (20 mEq total) by mouth daily.   rivaroxaban 20 MG Tabs tablet Commonly known as: Xarelto Take 1 tablet (20 mg total) by mouth daily with supper.   spironolactone 25 MG tablet Commonly known as: ALDACTONE Take 1 tablet (25 mg total) by mouth daily.        Disposition: Home Discharge Instructions     Diet - low sodium heart healthy   Complete by: As directed    Increase activity slowly   Complete by: As directed         Duration of Discharge Encounter: Greater than 30 minutes including physician time.  Jesus Night, PA-C 04/21/2022 11:17 AM  I have seen and examined this patient with Jesus Barnes.  Agree with above, note added to reflect my findings.  Patient presented to the hospital with atrial fibrillation for dofetilide load.  He currently feels well.  He had a cardioversion performed yesterday.  He remains in sinus rhythm.  Jesus Barnes for discharge today with follow-up in clinic.  GEN: Well nourished, well developed, in no acute distress  HEENT: normal  Neck: no JVD, carotid bruits, or masses Cardiac: RRR; no murmurs, rubs, or gallops,no edema  Respiratory:  clear to auscultation bilaterally, normal work of breathing GI: soft, nontender, nondistended, + BS MS: no deformity or atrophy  Skin: warm and dry Neuro:  Strength and sensation are intact Psych: euthymic mood, full affect     Jesus Barnes M. Victorino Fatzinger MD 04/21/2022 2:27 PM

## 2022-04-24 ENCOUNTER — Encounter (HOSPITAL_COMMUNITY): Payer: Self-pay | Admitting: Internal Medicine

## 2022-04-27 ENCOUNTER — Encounter (HOSPITAL_COMMUNITY): Payer: Self-pay | Admitting: Nurse Practitioner

## 2022-04-27 ENCOUNTER — Ambulatory Visit (HOSPITAL_COMMUNITY)
Admit: 2022-04-27 | Discharge: 2022-04-27 | Disposition: A | Discharge status: Home / Self Care | Payer: 59 | Source: Ambulatory Visit | Attending: Nurse Practitioner | Admitting: Nurse Practitioner

## 2022-04-27 VITALS — BP 98/66 | HR 117 | Ht 74.5 in | Wt 379.0 lb

## 2022-04-27 DIAGNOSIS — I480 Paroxysmal atrial fibrillation: Secondary | ICD-10-CM | POA: Diagnosis not present

## 2022-04-27 DIAGNOSIS — I10 Essential (primary) hypertension: Secondary | ICD-10-CM | POA: Diagnosis not present

## 2022-04-27 DIAGNOSIS — Z79899 Other long term (current) drug therapy: Secondary | ICD-10-CM | POA: Insufficient documentation

## 2022-04-27 DIAGNOSIS — I4819 Other persistent atrial fibrillation: Secondary | ICD-10-CM | POA: Diagnosis present

## 2022-04-27 LAB — BASIC METABOLIC PANEL
Anion gap: 9 (ref 5–15)
BUN: 14 mg/dL (ref 6–20)
CO2: 26 mmol/L (ref 22–32)
Calcium: 9 mg/dL (ref 8.9–10.3)
Chloride: 102 mmol/L (ref 98–111)
Creatinine, Ser: 0.98 mg/dL (ref 0.61–1.24)
GFR, Estimated: >60 mL/min (ref >60)
Glucose, Bld: 140 mg/dL — ABNORMAL HIGH (ref 70–99)
Potassium: 4.2 mmol/L (ref 3.5–5.1)
Sodium: 137 mmol/L (ref 135–145)

## 2022-04-27 LAB — MAGNESIUM: Magnesium: 2 mg/dL (ref 1.7–2.4)

## 2022-04-27 NOTE — Progress Notes (Signed)
Primary Care Physician: Janine Limbo, PA-C Referring Physician: Dr. Curt Bears Cardiologist: Dr. Jacqualine Code is a 52 y.o. male with a h/o dilated cardiomyopathy, HTN, persistent  afib in the past on Tikosyn but stopped due to being ineffective and noncompliance. He was then placed on amiodarone but cardioversion failed to convert pt and this was stopped March of 2022. It was thought that alcohol consumption was contributing to not being able to maintain SR as well.   He was seen by Dr. Curt Bears 04/03/22 and was found to have low EF and felt poorly. It was discussed with pt that he had developed tachycardia mediated cardiomyopathy and it was vital for him to be returned to [place]. He discussed repeating dofetilide load with admission. PT states he is committed to stop using alcohol and be compliant with Tikosyn this go round.  His lopressor was stopped and he was placed on Toprol XL 100 mg bid.   He is now here for Tikosyn admit. No benadryl use. No missed anticoagulation. He is feeling lightheaded this am and BP is soft around 90 systolic. He thinks he has gotten behind on his water intake. He is pending a sleep study in the near future. He has afib with RVR today.   F/u in afib clinic, 04/27/21. He  is here for one week f/u of tikosyn admit. He checked his HR last night and it was noted to be 55 bpm. 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 He is being compliant with tikosyn.   Today, he denies symptoms of palpitations, chest pain, shortness of breath, orthopnea, PND, lower extremity edema, dizziness, presyncope, syncope, or neurologic sequela. The patient is tolerating medications without difficulties and is otherwise without complaint today.   Past Medical History:  Diagnosis Date   Anxiety 09/02/2014   Back pain    Burn    Chest pain    Chronic anticoagulation 10/16/2016    Chronic fatigue syndrome    Depression    Dilated cardiomyopathy ([place]) 10/16/2016   EF 30-35%, 05/15/16 40%, 07/17/18 55-60%   Dysphagia    Esophageal stricture 09/02/2014   Essential hypertension 09/01/2014   GERD (gastroesophageal reflux disease)    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HCC)    High cholesterol    Hypertension    Hypertension, essential 08/08/2018   Hypertensive heart disease 09/01/2014   Joint pain    Mild CAD 10/16/2016   Morbid obesity with body mass index (BMI) of 45.0 to 49.9 in adult [facility]) 04/05/2016   Obstructive sleep apnea    awaiting treatment   On amiodarone therapy 10/16/2016   Palpitation    Paroxysmal atrial fibrillation ([place]) 12/12/2016   Persistent atrial fibrillation ([place]) 09/01/2014   Overview:  CHADS2=1   Sleep apnea    SOB (shortness of breath)    Past Surgical History:  Procedure Laterality Date   APPENDECTOMY     CARDIOVERSION     CARDIOVERSION N/A 12/14/2016   Procedure: CARDIOVERSION;  Surgeon: Evans Lance, MD;  Location: [place] [facility];  Service: Cardiovascular;  Laterality: N/A;   CARDIOVERSION N/A 08/15/2018   Procedure: CARDIOVERSION;  Surgeon: Sanda Klein, MD;  Location: [facility] ENDOS[facility];  Service: Cardiovascular;  Laterality: N/A;   CARDIOVERSION N/A 04/30/2020   Procedure: CARDIOVERSION;  Surgeon: Sanda Klein, MD;  Location: [facility]OS[facility];  Service: Cardiovascular;  Laterality:  N/A;   CARDIOVERSION N/A 06/02/2020   Procedure: CARDIOVERSION (CATH LAB);  Surgeon: Constance Haw, MD;  Location: [place] [facility];  Service: Cardiovascular;  Laterality: N/A;   CARDIOVERSION N/A 04/20/2022   Procedure: CARDIOVERSION;  Surgeon: [name], MD;  Location: [facility] ENDOSCOPY;  Service: Cardiovascular;  Laterality: N/A;   COLONOSCOPY  06/18/2001    Bx: Tubulovillous adenoma. Done by Dr. Melina Copa. He was told to come back 75yrlater, but pt didnot.    ESOPHAGOGASTRODUODENOSCOPY  12/17/2014   mild gastritis. Small antral polyps (likely inflammatory). Status post  empiric esophageal dilatation   HERNIA REPAIR     umblical   KNEE SURGERY     SKIN GRAFT     URETHRA SURGERY      Current Outpatient Medications  Medication Sig Dispense Refill   dapagliflozin propanediol (FARXIGA) 10 MG TABS tablet Take 1 tablet (10 mg total) by mouth daily before breakfast. 30 tablet 5   dofetilide (TIKOSYN) 500 MCG capsule Take 1 capsule (500 mcg total) by mouth 2 (two) times daily. 60 capsule 5   furosemide (LASIX) 40 MG tablet Take 1 tablet (40 mg total) by mouth daily. 90 tablet 3   lisinopril (ZESTRIL) 10 MG tablet Take 1 tablet (10 mg total) by mouth daily. 90 tablet 2   metoprolol succinate (TOPROL-XL) 100 MG 24 hr tablet Take 1 tablet (100 mg total) by mouth 2 (two) times daily. Take with or immediately following a meal. 60 tablet 3   nitroGLYCERIN (NITROSTAT) 0.4 MG SL tablet Place 0.4 mg under the tongue every 5 (five) minutes as needed for chest pain.     potassium chloride SA (KLOR-CON M) 20 MEQ tablet Take 1 tablet (20 mEq total) by mouth daily. 30 tablet 5   rivaroxaban (XARELTO) 20 MG TABS tablet Take 1 tablet (20 mg total) by mouth daily with supper. 90 tablet 1   spironolactone (ALDACTONE) 25 MG tablet Take 1 tablet (25 mg total) by mouth daily. 30 tablet 5   No current facility-administered medications for this encounter.    Allergies  Allergen Reactions   Lipitor [Atorvastatin] Other (See Comments)    Muscle Pain    Social History   Socioeconomic History   Marital status: Married    Spouse name: Not on file   Number of children: 1   Years of education: Not on file   Highest education level: Not on file  Occupational History   Occupation: Truck driver  Tobacco Use   Smoking status: Former    Types: Cigars   Smokeless tobacco: Never   Tobacco comments:    quit 7 years ago  Vaping Use   Vaping Use: Never used  Substance and Sexual Activity   Alcohol use: Not Currently    Comment: ocassionally   Drug use: No   Sexual activity: Not  on file  Other Topics Concern   Not on file  Social History Narrative   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[place] on file  Food Insecurity: No Food Insecurity (04/18/2022)   Hunger Vital Sign    Worried About Running Out of Food in the Last Year: Never true    Ran Out of Food in the Last Year: Never true  Transportation Needs: No Transportation Needs (04/18/2022)   PRAPARE - THydrologist(Medical): No    Lack of Transportation (Non-Medical): No  Physical Activity: Not on file  Stress: Not on file  Social Connections: Not on file  Intimate Partner Violence: Not At Risk (04/18/2022)   Humiliation, Afraid, Rape, and Kick questionnaire    Fear of Current or Ex-Partner: No    Emotionally Abused: No    Physically Abused: No    Sexually Abused: No    Family History  Problem Relation Age of Onset   Diabetes Paternal Grandfather    Heart disease Paternal Grandfather    Hypertension Mother    Obesity Mother    Hypertension Father    Heart disease Father    Colon cancer Neg Hx    Esophageal cancer Neg Hx     ROS- All systems are reviewed and negative except as per the HPI above  Physical Exam: Vitals:   04/27/22 0947  BP: 98/66  Pulse: (!) 117  Weight: (!) 171.9 kg  Height: 6' 2.5" (1.892 m)   Wt Readings from Last 3 Encounters:  04/27/22 (!) 171.9 kg  04/18/22 (!) 169.2 kg  04/18/22 (!) 172.5 kg    Labs: Lab Results  Component Value Date   NA 132 (L) 04/21/2022   K 4.0 04/21/2022   CL 97 (L) 04/21/2022   CO2 23 04/21/2022   GLUCOSE 104 (H) 04/21/2022   BUN 21 (H) 04/21/2022   CREATININE 0.91 04/21/2022   CALCIUM 9.0 04/21/2022   MG 2.1 04/21/2022   Lab Results  Component Value Date   INR 1.3 (H) 06/02/2020   Lab Results  Component Value Date   CHOL 165 05/27/2020   HDL 52 05/27/2020   LDLCALC 94 05/27/2020   TRIG 105 05/27/2020     GEN- The patient is well appearing,  alert and oriented x 3 today.   Head- normocephalic, atraumatic Eyes-  Sclera clear, conjunctiva pink Ears- hearing intact Oropharynx- clear Neck- supple, no JVP Lymph- no cervical lymphadenopathy Lungs- Clear to ausculation bilaterally, normal work of breathing Heart- rapid irregular rate and rhythm, no murmurs, rubs or gallops, PMI not laterally displaced GI- soft, NT, ND, + BS Extremities- no clubbing, cyanosis, or edema MS- no significant deformity or atrophy Skin- no rash or lesion Psych- euthymic mood, full affect Neuro- strength and sensation are intact  EKG- Vent. rate 117 BPM PR interval * ms QRS duration 100 ms QT/QTcB 350/488 ms P-R-T axes * 1 21 Atrial fibrillation with rapid ventricular response Nonspecific ST abnormality Abnormal ECG When compared with ECG of 21-Apr-2022 10:02, PREVIOUS ECG IS PRESENT  Echo- see echo report in EPIC form 02/28/22 EF 30-35% with LA diameter of 5.00 and volume of 135.00 ml  Assessment and Plan:   1. Persistent afib  Pt failed Tikosyn in the past for noncompliance and amiodarone was stopped in 05/2020 for being refractory to afib  Many cardioversion's over the years  He has stopped alcohol use x one month  Now with tachy mediated cardiomyopathy and Dr Curt Bears  admitted for tikosyn load and converted with cardioversion Now in afib this am, but I don't think he was in afib last night, very discouraged  Encouraged pt that he may return to [place] in a few hours  and I just caught in afib this am  Take V/S 2 x a day to note HR's and bring  back in one week and will decide if afib is persistent or paroxymal and  if cardioversion is needed  Continue xarelto 20 mg  Continue Tikosyn 500 mcg bid  Qt stable  Sleep study pending for snoring  No benadryl use  Bmet/mag    Recheck in one week   Butch Penny C. Dashonna Chagnon, [facility] [address] [place], [place] [address] [phone]

## 2022-04-27 NOTE — Patient Instructions (Signed)
Check blood pressure/heart rates after medications at least twice a day - bring chart with you.

## 2022-05-04 ENCOUNTER — Encounter (HOSPITAL_COMMUNITY): Payer: Self-pay | Admitting: Nurse Practitioner

## 2022-05-04 ENCOUNTER — Ambulatory Visit
Admission: RE | Admit: 2022-05-04 | Discharge: 2022-05-04 | Disposition: A | Discharge status: Home / Self Care | Payer: 59 | Source: Ambulatory Visit | Attending: Nurse Practitioner | Admitting: Nurse Practitioner

## 2022-05-04 VITALS — BP 100/76 | HR 100 | Ht 74.5 in | Wt 384.8 lb

## 2022-05-04 DIAGNOSIS — Z7901 Long term (current) use of anticoagulants: Secondary | ICD-10-CM | POA: Insufficient documentation

## 2022-05-04 DIAGNOSIS — Z7984 Long term (current) use of oral hypoglycemic drugs: Secondary | ICD-10-CM | POA: Diagnosis not present

## 2022-05-04 DIAGNOSIS — G4733 Obstructive sleep apnea (adult) (pediatric): Secondary | ICD-10-CM | POA: Insufficient documentation

## 2022-05-04 DIAGNOSIS — R000 Tachycardia, unspecified: Secondary | ICD-10-CM | POA: Insufficient documentation

## 2022-05-04 DIAGNOSIS — I10 Essential (primary) hypertension: Secondary | ICD-10-CM | POA: Diagnosis not present

## 2022-05-04 DIAGNOSIS — K219 Gastro-esophageal reflux disease without esophagitis: Secondary | ICD-10-CM | POA: Diagnosis not present

## 2022-05-04 DIAGNOSIS — I4819 Other persistent atrial fibrillation: Secondary | ICD-10-CM

## 2022-05-04 DIAGNOSIS — I5022 Chronic systolic (congestive) heart failure: Secondary | ICD-10-CM | POA: Diagnosis not present

## 2022-05-04 DIAGNOSIS — D6869 Other thrombophilia: Secondary | ICD-10-CM | POA: Diagnosis not present

## 2022-05-04 DIAGNOSIS — I110 Hypertensive heart disease with heart failure: Secondary | ICD-10-CM | POA: Diagnosis not present

## 2022-05-04 DIAGNOSIS — Z79899 Other long term (current) drug therapy: Secondary | ICD-10-CM | POA: Insufficient documentation

## 2022-05-04 DIAGNOSIS — I420 Dilated cardiomyopathy: Secondary | ICD-10-CM | POA: Diagnosis not present

## 2022-05-04 NOTE — Progress Notes (Signed)
Primary Care Physician: Janine Limbo, PA-C Referring Physician: Dr. Curt Bears Cardiologist: Dr. Jacqualine Code is a 52 y.o. male with a h/o dilated cardiomyopathy, HTN, persistent  afib in the past on Tikosyn but stopped due to being ineffective and noncompliance. He was then placed on amiodarone but cardioversion failed to convert pt and this was stopped March of 2022. It was thought that alcohol consumption was contributing to not being able to maintain SR as well.   He was seen by Dr. Curt Bears 04/03/22 and was found to have low EF and felt poorly. It was discussed with pt that he had developed tachycardia mediated cardiomyopathy and it was vital for him to be returned to [place]. He discussed repeating dofetilide load with admission. PT states he is committed to stop using alcohol and be compliant with Tikosyn this go round.  His lopressor was stopped and he was placed on Toprol XL 100 mg bid.   He is now here for Tikosyn admit. No benadryl use. No missed anticoagulation. He is feeling lightheaded this am and BP is soft around 90 systolic. He thinks he has gotten behind on his water intake. He is pending a sleep study in the near future. He has afib with RVR today.   F/u in afib clinic, 04/27/21. He  is here for one week f/u of tikosyn admit. He checked his HR last night and it was noted to be 55 bpm. 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 He is being compliant with tikosyn.   F/u in afib clinic,05/04/22. He continues in afib, rate controlled. 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 We discussed today to pursue cardioversion and he wants to proceed. No missed anticoagulation and compliant with tikosyn. QT is reading prolonged in afib but inaccurate when out of rhythm.   Today, he denies symptoms of  palpitations, chest pain, shortness of breath, orthopnea, PND, lower extremity edema, dizziness, presyncope, syncope, or neurologic sequela. The patient is tolerating medications without difficulties and is otherwise without complaint today.   Past Medical History:  Diagnosis Date   Anxiety 09/02/2014   Back pain    Burn    Chest pain    Chronic anticoagulation 10/16/2016   Chronic fatigue syndrome    Depression    Dilated cardiomyopathy ([place]) 10/16/2016   EF 30-35%, 05/15/16 40%, 07/17/18 55-60%   Dysphagia    Esophageal stricture 09/02/2014   Essential hypertension 09/01/2014   GERD (gastroesophageal reflux disease)    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HCC)    High cholesterol    Hypertension    Hypertension, essential 08/08/2018   Hypertensive heart disease 09/01/2014   Joint pain    Mild CAD 10/16/2016   Morbid obesity with body mass index (BMI) of 45.0 to 49.9 in adult [facility]) 04/05/2016   Obstructive sleep apnea    awaiting treatment   On amiodarone therapy 10/16/2016   Palpitation    Paroxysmal atrial fibrillation ([place]) 12/12/2016   Persistent atrial fibrillation ([place]) 09/01/2014   Overview:  CHADS2=1   Sleep apnea    SOB (shortness of breath)    Past Surgical History:  Procedure Laterality Date   APPENDECTOMY     CARDIOVERSION     CARDIOVERSION N/A 12/14/2016   Procedure: CARDIOVERSION;  Surgeon: Evans Lance, MD;  Location: [place] [facility];  Service: Cardiovascular;  Laterality: N/A;   CARDIOVERSION N/A 08/15/2018   Procedure: CARDIOVERSION;  Surgeon: Sanda Klein, MD;  [facility] ENDOS[facility];  Service: Cardiovascular;  Laterality: N/A;   CARDIOVERSION N/A 04/30/2020   Procedure: CARDIOVERSION;  Surgeon: Sanda Klein, MD;  Location: [place];  Service: Cardiovascular;  Laterality: N/A;   CARDIOVERSION N/A 06/02/2020   Procedure: CARDIOVERSION (CATH LAB);  Surgeon: Constance Haw, MD;  Location: [place] CV LAB;  Service: Cardiovascular;  Laterality: N/A;   CARDIOVERSION N/A  04/20/2022   Procedure: CARDIOVERSION;  Surgeon: [name], MD;  Location: [facility] ENDOSCOPY;  Service: Cardiovascular;  Laterality: N/A;   COLONOSCOPY  06/18/2001    Bx: Tubulovillous adenoma. Done by Dr. Melina Copa. He was told to come back 41yrlater, but pt didnot.    ESOPHAGOGASTRODUODENOSCOPY  12/17/2014   mild gastritis. Small antral polyps (likely inflammatory). Status post empiric esophageal dilatation   HERNIA REPAIR     umblical   KNEE SURGERY     SKIN GRAFT     URETHRA SURGERY      Current Outpatient Medications  Medication Sig Dispense Refill   dapagliflozin propanediol (FARXIGA) 10 MG TABS tablet Take 1 tablet (10 mg total) by mouth daily before breakfast. 30 tablet 5   dofetilide (TIKOSYN) 500 MCG capsule Take 1 capsule (500 mcg total) by mouth 2 (two) times daily. 60 capsule 5   furosemide (LASIX) 40 MG tablet Take 1 tablet (40 mg total) by mouth daily. 90 tablet 3   lisinopril (ZESTRIL) 10 MG tablet Take 1 tablet (10 mg total) by mouth daily. 90 tablet 2   metoprolol succinate (TOPROL-XL) 100 MG 24 hr tablet Take 1 tablet (100 mg total) by mouth 2 (two) times daily. Take with or immediately following a meal. 60 tablet 3   nitroGLYCERIN (NITROSTAT) 0.4 MG SL tablet Place 0.4 mg under the tongue every 5 (five) minutes as needed for chest pain.     potassium chloride SA (KLOR-CON M) 20 MEQ tablet Take 1 tablet (20 mEq total) by mouth daily. 30 tablet 5   rivaroxaban (XARELTO) 20 MG TABS tablet Take 1 tablet (20 mg total) by mouth daily with supper. 90 tablet 1   spironolactone (ALDACTONE) 25 MG tablet Take 1 tablet (25 mg total) by mouth daily. 30 tablet 5   No current facility-administered medications for this encounter.    Allergies  Allergen Reactions   Lipitor [Atorvastatin] Other (See Comments)    Muscle Pain    Social History   Socioeconomic History   Marital status: Married    Spouse name: Not on file   Number of children: 1   Years of education: Not on file    Highest education level: Not on file  Occupational History   Occupation: Truck driver  Tobacco Use   Smoking status: Former    Types: Cigars   Smokeless tobacco: Never   Tobacco comments:    quit 7 years ago  Vaping Use   Vaping Use: Never used  Substance and Sexual Activity   Alcohol use: Not Currently    Comment: ocassionally   Drug use: No   Sexual activity: Not on file  Other Topics Concern   Not on file  Social History Narrative   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   Financial Resource Strain: Not on file  Food Insecurity: No Food Insecurity (04/18/2022)   Hunger  Vital Sign    Worried About Charity fundraiser in the Last Year: Never true    Ran Out of Food in the Last Year: Never true  Transportation Needs: No Transportation Needs (04/18/2022)   PRAPARE - Hydrologist (Medical): No    Lack of Transportation (Non-Medical): No  Physical Activity: Not on file  Stress: Not on file  Social Connections: Not on file  Intimate Partner Violence: Not At Risk (04/18/2022)   Humiliation, Afraid, Rape, and Kick questionnaire    Fear of Current or Ex-Partner: No    Emotionally Abused: No    Physically Abused: No    Sexually Abused: No    Family History  Problem Relation Age of Onset   Diabetes Paternal Grandfather    Heart disease Paternal Grandfather    Hypertension Mother    Obesity Mother    Hypertension Father    Heart disease Father    Colon cancer Neg Hx    Esophageal cancer Neg Hx     ROS- All systems are reviewed and negative except as per the HPI above  Physical Exam: Vitals:   05/04/22 1006  BP: 100/76  Pulse: 100  Weight: (!) 174.5 kg  Height: 6' 2.5" (1.892 m)   Wt Readings from Last 3 Encounters:  05/04/22 (!) 174.5 kg  04/27/22 (!) 171.9 kg  04/18/22 (!) 169.2 kg    Labs: Lab Results  Component Value Date   NA 137 04/27/2022   K 4.2 04/27/2022   CL 102 04/27/2022   CO2 26 04/27/2022    GLUCOSE 140 (H) 04/27/2022   BUN 14 04/27/2022   CREATININE 0.98 04/27/2022   CALCIUM 9.0 04/27/2022   MG 2.0 04/27/2022   Lab Results  Component Value Date   INR 1.3 (H) 06/02/2020   Lab Results  Component Value Date   CHOL 165 05/27/2020   HDL 52 05/27/2020   LDLCALC 94 05/27/2020   TRIG 105 05/27/2020     GEN- The patient is well appearing, alert and oriented x 3 today.   Head- normocephalic, atraumatic Eyes-  Sclera clear, conjunctiva pink Ears- hearing intact Oropharynx- clear Neck- supple, no JVP Lymph- no cervical lymphadenopathy Lungs- Clear to ausculation bilaterally, normal work of breathing Heart-  irregular rate and rhythm, no murmurs, rubs or gallops, PMI not laterally displaced GI- soft, NT, ND, + BS Extremities- no clubbing, cyanosis, or edema MS- no significant deformity or atrophy Skin- no rash or lesion Psych- euthymic mood, full affect Neuro- strength and sensation are intact  EKG- Vent. rate 100 BPM PR interval * ms QRS duration 100 ms QT/QTcB 426/549 ms P-R-T axes * -13 13 Atrial fibrillation with premature ventricular or aberrantly conducted complexes Nonspecific ST and T wave abnormality Prolonged QT (due to being in afib, inaccurate)  Abnormal ECG When compared with ECG of 27-Apr-2022 10:25, PREVIOUS ECG IS PRESENT  Echo- see echo report in EPIC form 02/28/22 EF 30-35% with LA diameter of 5.00 and volume of 135.00 ml  Assessment and Plan:   1. Persistent afib  Pt failed Tikosyn in the past for noncompliance and amiodarone was stopped in 05/2020 for being refractory to afib  Many cardioversion's over the years  He has stopped alcohol use   Now with tachy mediated cardiomyopathy and Dr Curt Bears  admitted for tikosyn load and converted with cardioversion Now  with persistent afib  Discussed pursuing cardioversion and he wants to pursue  Continue xarelto 20 mg, no missed doses  Continue Tikosyn 500 mcg bid  Bmet/mag/cbc Sleep study  pending for snoring   Recheck with Dr. Curt Bears 3/18 after cardioversion   Geroge Baseman. Juanetta Negash, [facility] [address] [place],  [address] [phone]

## 2022-05-04 NOTE — Patient Instructions (Signed)
Cardioversion scheduled for: Tuesday, March 12th   - Arrive at the [facility] and go to admitting at 830am   - Do not eat or drink anything after midnight the night prior to your procedure.   - Take all your morning medication (except diabetic medications) with a sip of water prior to arrival.  - You will not be able to drive home after your procedure.    - Do NOT miss any doses of your blood thinner - if you should miss a dose please notify our office immediately.   - If you feel as if you go back into normal rhythm prior to scheduled cardioversion, please notify our office immediately.   If your procedure is canceled in the cardioversion suite you will be charged a cancellation fee.    Hold medication 7 days prior to scheduled procedure/anesthesia.  Restart medication on the normal dosing day after scheduled procedure/anesthesia  Dulaglutide (Trulicity) Exenatide extended release (Bydureon bcise) Semaglutide (Ozempic) (WEGOVY)  Tirzepatide (Mounjaro)     Hold medication 24 hours prior to scheduled procedure/anesthesia.   Restart medication on the following day after scheduled procedure/anesthesia   Exenatide (Byetta)  Liraglutide (Victoza, Saxenda)  Lixisenatide (Adlyxin)  Semaglutide (Rybelsus) Polyethylene Glycol Loxenatide   For those patients who have a scheduled procedure/anesthesia on the same day of the week as their dose, hold the medication on the day of surgery.  They can take their scheduled dose the week before.  **Patients on the above medications scheduled for elective procedures that have not held the medication for the appropriate amount of time are at risk of cancellation or change in the anesthetic plan.

## 2022-05-05 ENCOUNTER — Ambulatory Visit: Payer: 59 | Admitting: Cardiology

## 2022-05-05 ENCOUNTER — Encounter: Payer: Self-pay | Admitting: Cardiology

## 2022-05-05 VITALS — BP 134/72 | HR 67 | Ht 74.5 in | Wt 385.8 lb

## 2022-05-05 DIAGNOSIS — G4733 Obstructive sleep apnea (adult) (pediatric): Secondary | ICD-10-CM | POA: Diagnosis not present

## 2022-05-05 DIAGNOSIS — I10 Essential (primary) hypertension: Secondary | ICD-10-CM

## 2022-05-05 NOTE — Addendum Note (Signed)
Addended by: Joni Reining on: 05/05/2022 10:34 AM   Modules accepted: Orders

## 2022-05-05 NOTE — Progress Notes (Addendum)
Sleep Medicine CONSULT Note    Date:  05/05/2022   ID:  Jesus Barnes, DOB [birth year], MRN [id number]  PCP:  Janine Limbo, PA-C  Cardiologist: Shirlee More, MD   Chief Complaint  Patient presents with   New Patient (Initial Visit)    OSA    History of Present Illness:  Jesus Barnes is a 52 y.o. male who is being seen today for the evaluation of obstructive sleep apnea at the request of Shirlee More, MD.  This is a 52 year old male with a history of dilated cardiomyopathy, chronic systolic CHF, hypertension, GERD, hyperlipidemia.  He has a history of obstructive sleep apnea and has tried CPAP about 3 years ago but was unable to tolerate it and refused to continue therapy.  I do not have any records on his sleep study available. We last saw Dr. Curt Bears for his A-fib he stated he was willing to try the CPAP again.  He is now referred to establish sleep care.  He tells me that when he tried to use his CPAP he could not keep the mask on and would find it on the floor.  He feels sluggish when he wakes up in the am but has no daytime sleepiness once he is up.  He only sleeps 4-5 hours nightly and feels worse if he sleeps longer.  He denies any issues wit HAs in the am.  He wakes up with a dry mouth from mouth breathing.  He has been told he snores and does wake himself up snoring but very rare in occurrence.  He does not wake up gasping for breath.   Past Medical History:  Diagnosis Date   Anxiety 09/02/2014   Back pain    Burn    Chest pain    Chronic anticoagulation 10/16/2016   Chronic fatigue syndrome    Depression    Dilated cardiomyopathy ([place]) 10/16/2016   EF 30-35%, 05/15/16 40%, 07/17/18 55-60%   Dysphagia    Esophageal stricture 09/02/2014   Essential hypertension 09/01/2014   GERD (gastroesophageal reflux disease)    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HCC)    High cholesterol    Hypertension    Hypertension, essential 08/08/2018   Hypertensive heart disease 09/01/2014   Joint pain    Mild  CAD 10/16/2016   Morbid obesity with body mass index (BMI) of 45.0 to 49.9 in adult [facility]) 04/05/2016   Obstructive sleep apnea    awaiting treatment   On amiodarone therapy 10/16/2016   Palpitation    Paroxysmal atrial fibrillation ([place]) 12/12/2016   Persistent atrial fibrillation ([place]) 09/01/2014   Overview:  CHADS2=1   Sleep apnea    SOB (shortness of breath)     Past Surgical History:  Procedure Laterality Date   APPENDECTOMY     CARDIOVERSION     CARDIOVERSION N/A 12/14/2016   Procedure: CARDIOVERSION;  Surgeon: Evans Lance, MD;  Location: [place] [facility];  Service: Cardiovascular;  Laterality: N/A;   CARDIOVERSION N/A 08/15/2018   Procedure: CARDIOVERSION;  Surgeon: Sanda Klein, MD;  [facility];  Service: Cardiovascular;  Laterality: N/A;   CARDIOVERSION N/A 04/30/2020   Procedure: CARDIOVERSION;  Surgeon: Sanda Klein, MD;  Location: [place];  Service: Cardiovascular;  Laterality: N/A;   CARDIOVERSION N/A 06/02/2020   Procedure: CARDIOVERSION (CATH LAB);  Surgeon: Constance Haw, MD;  Location: [place] [facility];  Service: Cardiovascular;  Laterality: N/A;   CARDIOVERSION N/A 04/20/2022   Procedure: CARDIOVERSION;  Surgeon: [name],  MD;  Location: [place];  Service: Cardiovascular;  Laterality: N/A;   COLONOSCOPY  06/18/2001    Bx: Tubulovillous adenoma. Done by Dr. Melina Copa. He was told to come back 13yrlater, but pt didnot.    ESOPHAGOGASTRODUODENOSCOPY  12/17/2014   mild gastritis. Small antral polyps (likely inflammatory). Status post empiric esophageal dilatation   HERNIA REPAIR     umblical   KNEE SURGERY     SKIN GRAFT     URETHRA SURGERY      Current Medications: Current Meds  Medication Sig   dapagliflozin propanediol (FARXIGA) 10 MG TABS tablet Take 1 tablet (10 mg total) by mouth daily before breakfast.   dofetilide (TIKOSYN) 500 MCG capsule Take 1 capsule (500 mcg total) by mouth 2 (two) times daily.   furosemide  (LASIX) 40 MG tablet Take 1 tablet (40 mg total) by mouth daily.   lisinopril (ZESTRIL) 10 MG tablet Take 1 tablet (10 mg total) by mouth daily.   metoprolol succinate (TOPROL-XL) 100 MG 24 hr tablet Take 1 tablet (100 mg total) by mouth 2 (two) times daily. Take with or immediately following a meal.   nitroGLYCERIN (NITROSTAT) 0.4 MG SL tablet Place 0.4 mg under the tongue every 5 (five) minutes as needed for chest pain.   potassium chloride SA (KLOR-CON M) 20 MEQ tablet Take 1 tablet (20 mEq total) by mouth daily.   rivaroxaban (XARELTO) 20 MG TABS tablet Take 1 tablet (20 mg total) by mouth daily with supper.   spironolactone (ALDACTONE) 25 MG tablet Take 1 tablet (25 mg total) by mouth daily.    Allergies:   Lipitor [atorvastatin]   Social History   Socioeconomic History   Marital status: Married    Spouse name: Not on file   Number of children: 1   Years of education: Not on file   Highest education level: Not on file  Occupational History   Occupation: Truck driver  Tobacco Use   Smoking status: Former    Types: Cigars   Smokeless tobacco: Never   Tobacco comments:    quit 7 years ago  Vaping Use   Vaping Use: Never used  Substance and Sexual Activity   Alcohol use: Not Currently    Comment: ocassionally   Drug use: No   Sexual activity: Not on file  Other Topics Concern   Not on file  Social History Narrative   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  Food Insecurity: No Food Insecurity (04/18/2022)   Hunger Vital Sign    Worried About Running Out of Food in the Last Year: Never true    Ran Out of Food in the Last Year: Never true  Transportation Needs: No Transportation Needs (04/18/2022)   PRAPARE - THydrologist(Medical): No    Lack of Transportation (Non-Medical): No  Physical Activity: Not on file  Stress: Not on file  Social Connections: Not on file     Family  History:  The patient's family history includes Diabetes in his paternal grandfather; Heart disease in his father and paternal grandfather; Hypertension in his father and mother; Obesity in his mother.   ROS:   Please see the history of present illness.    ROS All other systems reviewed and are negative.      No data to display             PHYSICAL EXAM:  VS:  BP 134/72   Pulse 67   Ht 6' 2.5" (1.892 m)   Wt (!) 385 lb 12.8 oz (175 kg)   SpO2 98%   BMI 48.87 kg/m    GEN: Well nourished, well developed, in no acute distress  HEENT: normal  Neck: no JVD, carotid bruits, or masses Cardiac: irregularly irregular; no murmurs, rubs, or gallops,no edema.  Intact distal pulses bilaterally.  Respiratory:  clear to auscultation bilaterally, normal work of breathing GI: soft, nontender, nondistended, + BS MS: no deformity or atrophy  Skin: warm and dry, no rash Neuro:  Alert and Oriented x 3, Strength and sensation are intact Psych: euthymic mood, full affect  Wt Readings from Last 3 Encounters:  05/05/22 (!) 385 lb 12.8 oz (175 kg)  05/04/22 (!) 384 lb 12.8 oz (174.5 kg)  04/27/22 (!) 379 lb (171.9 kg)      Studies/Labs Reviewed:   none  Recent Labs: 02/14/2022: ALT 29; Hemoglobin 15.1; NT-Pro BNP 670; Platelets 279; TSH 3.320 04/27/2022: BUN 14; Creatinine, Ser 0.98; Magnesium 2.0; Potassium 4.2; Sodium 137     ASSESSMENT:    1. OSA (obstructive sleep apnea)   2. Hypertension, essential      PLAN:  In order of problems listed above:  OSA -He has a history of obstructive sleep apnea in the past and has tried CPAP in the past but did not tolerate it and stopped using it -He has had issues with atrial fibrillation and is now referred back by EP for treatment of obstructive sleep apnea due to concern that it may be driving his A-fib -he is a truck driver but has not had any problems with excessive daytime sleepiness -We will start out by getting a split-night sleep  study to determine the degree of sleep apnea and then try to get him titrated on CPAP.  I will see him back after his studies -unfortunately he is not a candidate for the Inspire device due to morbid obesity  2.  Hypertension -BP controlled on exam -Continue drug management with lisinopril 10 mg daily, spironolactone 25 mg daily and Toprol-XL 100 mg twice daily with as needed refills  Time Spent: 20 minutes total time of encounter, including 15 minutes spent in face-to-face patient care on the date of this encounter. This time includes coordination of care and counseling regarding above mentioned problem list. Remainder of non-face-to-face time involved reviewing chart documents/testing relevant to the patient encounter and documentation in the medical record. I have independently reviewed documentation from referring provider  Medication Adjustments/Labs and Tests Ordered: Current medicines are reviewed at length with the patient today.  Concerns regarding medicines are outlined above.  Medication changes, Labs and Tests ordered today are listed in the Patient Instructions below.  There are no Patient Instructions on file for this visit.   Signed, Fransico Him, MD  05/05/2022 9:56 AM    [address], [place], [place]  [address] Phone: [phone]; Fax: [phone]

## 2022-05-05 NOTE — Patient Instructions (Signed)
Medication Instructions:  Your physician recommends that you continue on your current medications as directed. Please refer to the Current Medication list given to you today.  *If you need a refill on your cardiac medications before your next appointment, please call your pharmacy*   Lab Work: None.  If you have labs (blood work) drawn today and your tests are completely normal, you will receive your results only by: [place] (if you have MyChart) OR A paper copy in the mail If you have any lab test that is abnormal or we need to change your treatment, we will call you to review the results.   Testing/Procedures: Your physician has ordered a sleep study for you. A member of our sleep team will call you to get this set up.    Follow-Up: At [facility], you and your health needs are our priority.  As part of our continuing mission to provide you with exceptional heart care, we have created designated Provider Care Teams.  These Care Teams include your primary Cardiologist (physician) and Advanced Practice Providers (APPs -  Physician Assistants and Nurse Practitioners) who all work together to provide you with the care you need, when you need it.  We recommend signing up for the patient portal called "MyChart".  Sign up information is provided on this After Visit Summary.  MyChart is used to connect with patients for Virtual Visits (Telemedicine).  Patients are able to view lab/test results, encounter notes, upcoming appointments, etc.  Non-urgent messages can be sent to your provider as well.   To learn more about what you can do with MyChart, go to [email or web address].    Your next appointment will be dependent on the results of your study and it will be with:   Provider:   Dr. Fransico Him, MD

## 2022-05-09 ENCOUNTER — Ambulatory Visit: Payer: 59 | Attending: Cardiology | Admitting: Cardiology

## 2022-05-09 ENCOUNTER — Encounter: Payer: Self-pay | Admitting: Cardiology

## 2022-05-09 VITALS — BP 126/86 | HR 60 | Ht 74.5 in | Wt 382.0 lb

## 2022-05-09 DIAGNOSIS — I5022 Chronic systolic (congestive) heart failure: Secondary | ICD-10-CM

## 2022-05-09 DIAGNOSIS — Z7901 Long term (current) use of anticoagulants: Secondary | ICD-10-CM | POA: Diagnosis not present

## 2022-05-09 DIAGNOSIS — I110 Hypertensive heart disease with heart failure: Secondary | ICD-10-CM

## 2022-05-09 DIAGNOSIS — I480 Paroxysmal atrial fibrillation: Secondary | ICD-10-CM

## 2022-05-09 DIAGNOSIS — I5032 Chronic diastolic (congestive) heart failure: Secondary | ICD-10-CM

## 2022-05-09 MED ORDER — ENTRESTO 49-51 MG PO TABS: 1.0000 | ORAL_TABLET | Freq: Two times a day (BID) | ORAL | 3 refills | Status: DC

## 2022-05-09 NOTE — H&P (View-Only) (Signed)
Cardiology Office Note:    Date:  05/09/2022   ID:  Jesus Barnes, DOB 10/03/1970, MRN [id number]  PCP:  [name], [name], PA-C  Cardiologist:  Katieann Hungate, MD    Referring MD: [name], [name], PA-C    ASSESSMENT:    1. Chronic systolic (congestive) heart failure (HCC)   2. Hypertensive heart disease with chronic diastolic congestive heart failure (HCC)   3. Paroxysmal atrial fibrillation (HCC)   4. Chronic anticoagulation    PLAN:    In order of problems listed above:  Optimize medical therapy transition from lisinopril to Entresto continue beta-blocker diuretic MRA and SGLT2 inhibitor follow-up 6 weeks for up titration Being followed by A-fib clinic I suspect he is in and out of atrial fibrillation he is on Tikosyn and continue his anticoagulant   Next appointment: 6 weeks   Medication Adjustments/Labs and Tests Ordered: Current medicines are reviewed at length with the patient today.  Concerns regarding medicines are outlined above.  No orders of the defined types were placed in this encounter.  No orders of the defined types were placed in this encounter.  Chief complaint follow-up atrial fibrillation   History of Present Illness:    Jesus Barnes is a 51 y.o. male with a hx of paroxysmal atrial fibrillation with previous catheter ablation chronic anticoagulation hypertensive heart disease with heart failure precipitated by atrial fibrillation mild CAD and unfortunately the setting of recurrent atrial fibrillation noncompliance with medicine recurrent severe LV dysfunction with an ejection fraction in the range of 30 to 35% after previous normalization maintaining sinus rhythm.  He was last seen 03/29/2022.  Most recent has been seen by EP after Tikosyn admission cardioversion and Dr. Turner for his obstructive sleep apnea.  He is in the A-fib clinic 04/27/2022 with recurrent atrial fibrillation.  Compliance with diet, lifestyle and medications: Yes  Multifaceted  visit He feels very weak deconditioned and after discussion refer to cardiac rehabilitation for heart failure systolic dysfunction cardiomyopathy He thinks that he is in and out of atrial fibrillation. 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 Not having edema shortness of breath syncope or bleeding from his anticoagulant Past Medical History:  Diagnosis Date   Anxiety 09/02/2014   Back pain    Burn    Chest pain    Chronic anticoagulation 10/16/2016   Chronic fatigue syndrome    Depression    Dilated cardiomyopathy (HCC) 10/16/2016   EF 30-35%, 05/15/16 40%, 07/17/18 55-60%   Dysphagia    Esophageal stricture 09/02/2014   Essential hypertension 09/01/2014   GERD (gastroesophageal reflux disease)    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HCC)    High cholesterol    Hypertension    Hypertension, essential 08/08/2018   Hypertensive heart disease 09/01/2014   Joint pain    Mild CAD 10/16/2016   Morbid obesity with body mass index (BMI) of 45.0 to 49.9 in adult (HCC) 04/05/2016   Obstructive sleep apnea    awaiting treatment   On amiodarone therapy 10/16/2016   Palpitation    Paroxysmal atrial fibrillation (HCC) 12/12/2016   Persistent atrial fibrillation (HCC) 09/01/2014   Overview:  CHADS2=1   Sleep apnea    SOB (shortness of breath)     Past Surgical History:  Procedure Laterality Date   APPENDECTOMY     CARDIOVERSION     CARDIOVERSION N/A 12/14/2016   Procedure: CARDIOVERSION;  Surgeon: Taylor, Gregg W, MD;  [facility];    Service: Cardiovascular;  Laterality: N/A;   CARDIOVERSION N/A 08/15/2018   Procedure: CARDIOVERSION;  Surgeon: Croitoru, Mihai, MD;  [facility];  Service: Cardiovascular;  Laterality: N/A;   CARDIOVERSION N/A 04/30/2020   Procedure: CARDIOVERSION;  Surgeon: Croitoru, Mihai, MD;  [facility];  Service: Cardiovascular;  Laterality: N/A;   CARDIOVERSION N/A 06/02/2020   Procedure:  CARDIOVERSION (CATH LAB);  Surgeon: Camnitz, Will Martin, MD;  [facility];  Service: Cardiovascular;  Laterality: N/A;   CARDIOVERSION N/A 04/20/2022   Procedure: CARDIOVERSION;  Surgeon: Hilty, Kenneth C, MD;  [facility];  Service: Cardiovascular;  Laterality: N/A;   COLONOSCOPY  06/18/2001    Bx: Tubulovillous adenoma. Done by Dr. Butler. He was told to come back 1yr later, but pt didnot.    ESOPHAGOGASTRODUODENOSCOPY  12/17/2014   mild gastritis. Small antral polyps (likely inflammatory). Status post empiric esophageal dilatation   HERNIA REPAIR     umblical   KNEE SURGERY     SKIN GRAFT     URETHRA SURGERY      Current Medications: Current Meds  Medication Sig   dapagliflozin propanediol (FARXIGA) 10 MG TABS tablet Take 1 tablet (10 mg total) by mouth daily before breakfast.   dofetilide (TIKOSYN) 500 MCG capsule Take 1 capsule (500 mcg total) by mouth 2 (two) times daily.   furosemide (LASIX) 40 MG tablet Take 1 tablet (40 mg total) by mouth daily.   lisinopril (ZESTRIL) 10 MG tablet Take 1 tablet (10 mg total) by mouth daily.   metoprolol succinate (TOPROL-XL) 100 MG 24 hr tablet Take 1 tablet (100 mg total) by mouth 2 (two) times daily. Take with or immediately following a meal.   nitroGLYCERIN (NITROSTAT) 0.4 MG SL tablet Place 0.4 mg under the tongue every 5 (five) minutes as needed for chest pain.   PARoxetine (PAXIL) 20 MG tablet Take 20 mg by mouth daily.   potassium chloride SA (KLOR-CON M) 20 MEQ tablet Take 1 tablet (20 mEq total) by mouth daily.   rivaroxaban (XARELTO) 20 MG TABS tablet Take 1 tablet (20 mg total) by mouth daily with supper.   spironolactone (ALDACTONE) 25 MG tablet Take 1 tablet (25 mg total) by mouth daily.     Allergies:   Lipitor [atorvastatin]   Social History   Socioeconomic History   Marital status: Married    Spouse name: Not on file   Number of children: 1   Years of education: Not on file   Highest education  level: Not on file  Occupational History   Occupation: Truck driver  Tobacco Use   Smoking status: Former    Types: Cigars   Smokeless tobacco: Never   Tobacco comments:    quit 7 years ago  Vaping Use   Vaping Use: Never used  Substance and Sexual Activity   Alcohol use: Not Currently    Comment: ocassionally   Drug use: No   Sexual activity: Not on file  Other Topics Concern   Not on file  Social History Narrative   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[place]   Social Determinants of Health   Financial Resource Strain: Not on file  Food Insecurity: No Food Insecurity (04/18/2022)   Hunger Vital Sign    Worried About Running Out of Food in the Last Year: Never true    Ran Out of Food in the Last Year: Never true  Transportation Needs: No Transportation Needs (04/18/2022)   PRAPARE - Transportation    Lack   of Transportation (Medical): No    Lack of Transportation (Non-Medical): No  Physical Activity: Not on file  Stress: Not on file  Social Connections: Not on file     Family History: The patient's family history includes Diabetes in his paternal grandfather; Heart disease in his father and paternal grandfather; Hypertension in his father and mother; Obesity in his mother. There is no history of Colon cancer or Esophageal cancer. ROS:   Please see the history of present illness.    All other systems reviewed and are negative.  EKGs/Labs/Other Studies Reviewed:    The following studies were reviewed today:    Recent Labs: 02/14/2022: ALT 29; Hemoglobin 15.1; NT-Pro BNP 670; Platelets 279; TSH 3.320 04/27/2022: BUN 14; Creatinine, Ser 0.98; Magnesium 2.0; Potassium 4.2; Sodium 137  Recent Lipid Panel    Component Value Date/Time   CHOL 165 05/27/2020 1209   TRIG 105 05/27/2020 1209   HDL 52 05/27/2020 1209   LDLCALC 94 05/27/2020 1209    Physical Exam:    VS:  BP 126/86 (BP Location: Right Arm, Patient Position: Sitting)   Pulse 60   Ht 6' 2.5" (1.892 m)   Wt (!) 382  lb (173.3 kg)   SpO2 97%   BMI 48.39 kg/m     Wt Readings from Last 3 Encounters:  05/09/22 (!) 382 lb (173.3 kg)  05/05/22 (!) 385 lb 12.8 oz (175 kg)  05/04/22 (!) 384 lb 12.8 oz (174.5 kg)     GEN:  Well nourished, well developed in no acute distress HEENT: Normal NECK: No JVD; No carotid bruits LYMPHATICS: No lymphadenopathy CARDIAC: RRR, no murmurs, rubs, gallops I suspect he is in sinus rhythm today RESPIRATORY:  Clear to auscultation without rales, wheezing or rhonchi  ABDOMEN: Soft, non-tender, non-distended MUSCULOSKELETAL:  No edema; No deformity  SKIN: Warm and dry NEUROLOGIC:  Alert and oriented x 3 PSYCHIATRIC:  Normal affect    Signed, Jayquon Theiler, MD  05/09/2022 1:30 PM    [facility]  

## 2022-05-09 NOTE — Patient Instructions (Signed)
Medication Instructions:  Your physician has recommended you make the following change in your medication:   STOP: Lisinopril (for 3 days then start Entresto) START: Entresto 49-51 twice daily  *If you need a refill on your cardiac medications before your next appointment, please call your pharmacy*   Lab Work: None If you have labs (blood work) drawn today and your tests are completely normal, you will receive your results only by: [place] (if you have MyChart) OR A paper copy in the mail If you have any lab test that is abnormal or we need to change your treatment, we will call you to review the results.   Testing/Procedures: None   Follow-Up: At [facility], you and your health needs are our priority.  As part of our continuing mission to provide you with exceptional heart care, we have created designated Provider Care Teams.  These Care Teams include your primary Cardiologist (physician) and Advanced Practice Providers (APPs -  Physician Assistants and Nurse Practitioners) who all work together to provide you with the care you need, when you need it.  We recommend signing up for the patient portal called "MyChart".  Sign up information is provided on this After Visit Summary.  MyChart is used to connect with patients for Virtual Visits (Telemedicine).  Patients are able to view lab/test results, encounter notes, upcoming appointments, etc.  Non-urgent messages can be sent to your provider as well.   To learn more about what you can do with MyChart, go to [email or web address].    Your next appointment:   6 week(s)  Provider:   Venia Carbon, NP [facility])    Other Instructions Refer to cardiac rehab at [facility]

## 2022-05-09 NOTE — Addendum Note (Signed)
Addended by: Edwyna Shell I on: 05/09/2022 01:44 PM   Modules accepted: Orders

## 2022-05-09 NOTE — Progress Notes (Signed)
Cardiology Office Note:    Date:  05/09/2022   ID:  [name], DOB 09-02-1970, MRN [id number]  PCP:  Janine Limbo, PA-C  Cardiologist:  Shirlee More, MD    Referring MD: Janine Limbo, PA-C    ASSESSMENT:    1. Chronic systolic (congestive) heart failure ([place])   2. Hypertensive heart disease with chronic diastolic congestive heart failure (H[facility])   3. Paroxysmal atrial fibrillation ([facility])   4. Chronic anticoagulation    PLAN:    In order of problems listed above:  Optimize medical therapy transition from lisinopril to Entresto continue beta-blocker diuretic MRA and SGLT2 inhibitor follow-up 6 weeks for up titration Being followed by A-fib clinic I suspect he is in and out of atrial fibrillation he is on Tikosyn and continue his anticoagulant   Next appointment: 6 weeks   Medication Adjustments/Labs and Tests Ordered: Current medicines are reviewed at length with the patient today.  Concerns regarding medicines are outlined above.  No orders of the defined types were placed in this encounter.  No orders of the defined types were placed in this encounter.  Chief complaint follow-up atrial fibrillation   History of Present Illness:    Jesus Barnes is a 52 y.o. male with a hx of paroxysmal atrial fibrillation with previous catheter ablation chronic anticoagulation hypertensive heart disease with heart failure precipitated by atrial fibrillation mild CAD and unfortunately the setting of recurrent atrial fibrillation noncompliance with medicine recurrent severe LV dysfunction with an ejection fraction in the range of 30 to 35% after previous normalization maintaining sinus rhythm.  He was last seen 03/29/2022.  Most recent has been seen by EP after Tikosyn admission cardioversion and Dr. Radford Barnes for his obstructive sleep apnea.  He is in the A-fib clinic 04/27/2022 with recurrent atrial fibrillation.  Compliance with diet, lifestyle and medications: Yes  Multifaceted  visit He feels very weak deconditioned and after discussion refer to cardiac rehabilitation for heart failure systolic dysfunction cardiomyopathy He thinks that he is in and out of atrial fibrillation. 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 Not having edema shortness of breath syncope or bleeding from his anticoagulant Past Medical History:  Diagnosis Date   Anxiety 09/02/2014   Back pain    Burn    Chest pain    Chronic anticoagulation 10/16/2016   Chronic fatigue syndrome    Depression    Dilated cardiomyopathy ([place]) 10/16/2016   EF 30-35%, 05/15/16 40%, 07/17/18 55-60%   Dysphagia    Esophageal stricture 09/02/2014   Essential hypertension 09/01/2014   GERD (gastroesophageal reflux disease)    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H[facility])    High cholesterol    Hypertension    Hypertension, essential 08/08/2018   Hypertensive heart disease 09/01/2014   Joint pain    Mild CAD 10/16/2016   Morbid obesity with body mass index (BMI) of 45.0 to 49.9 in adult [facility]) 04/05/2016   Obstructive sleep apnea    awaiting treatment   On amiodarone therapy 10/16/2016   Palpitation    Paroxysmal atrial fibrillation ([place]) 12/12/2016   Persistent atrial fibrillation ([place]) 09/01/2014   Overview:  CHADS2=1   Sleep apnea    SOB (shortness of breath)     Past Surgical History:  Procedure Laterality Date   APPENDECTOMY     CARDIOVERSION     CARDIOVERSION N/A 12/14/2016   Procedure: CARDIOVERSION;  Surgeon: Barnes Lance, MD;  Location: [place] [facility];  Service: Cardiovascular;  Laterality: N/A;   CARDIOVERSION N/A 08/15/2018   Procedure: CARDIOVERSION;  Surgeon: Jesus Klein, MD;  Location: [facility] ENDOS[facility];  Service: Cardiovascular;  Laterality: N/A;   CARDIOVERSION N/A 04/30/2020   Procedure: CARDIOVERSION;  Surgeon: Jesus Klein, MD;  Location: [place];  Service: Cardiovascular;  Laterality: N/A;   CARDIOVERSION N/A 06/02/2020   Procedure:  CARDIOVERSION (CATH LAB);  Surgeon: Jesus Haw, MD;  Location: [place] [facility];  Service: Cardiovascular;  Laterality: N/A;   CARDIOVERSION N/A 04/20/2022   Procedure: CARDIOVERSION;  Surgeon: Jesus Casino, MD;  Location: [facility] ENDOSCOPY;  Service: Cardiovascular;  Laterality: N/A;   COLONOSCOPY  06/18/2001    Bx: Tubulovillous adenoma. Done by Dr. Melina Barnes. He was told to come back 56yrlater, but pt didnot.    ESOPHAGOGASTRODUODENOSCOPY  12/17/2014   mild gastritis. Small antral polyps (likely inflammatory). Status post empiric esophageal dilatation   HERNIA REPAIR     umblical   KNEE SURGERY     SKIN GRAFT     URETHRA SURGERY      Current Medications: Current Meds  Medication Sig   dapagliflozin propanediol (FARXIGA) 10 MG TABS tablet Take 1 tablet (10 mg total) by mouth daily before breakfast.   dofetilide (TIKOSYN) 500 MCG capsule Take 1 capsule (500 mcg total) by mouth 2 (two) times daily.   furosemide (LASIX) 40 MG tablet Take 1 tablet (40 mg total) by mouth daily.   lisinopril (ZESTRIL) 10 MG tablet Take 1 tablet (10 mg total) by mouth daily.   metoprolol succinate (TOPROL-XL) 100 MG 24 hr tablet Take 1 tablet (100 mg total) by mouth 2 (two) times daily. Take with or immediately following a meal.   nitroGLYCERIN (NITROSTAT) 0.4 MG SL tablet Place 0.4 mg under the tongue every 5 (five) minutes as needed for chest pain.   PARoxetine (PAXIL) 20 MG tablet Take 20 mg by mouth daily.   potassium chloride SA (KLOR-CON M) 20 MEQ tablet Take 1 tablet (20 mEq total) by mouth daily.   rivaroxaban (XARELTO) 20 MG TABS tablet Take 1 tablet (20 mg total) by mouth daily with supper.   spironolactone (ALDACTONE) 25 MG tablet Take 1 tablet (25 mg total) by mouth daily.     Allergies:   Lipitor [atorvastatin]   Social History   Socioeconomic History   Marital status: Married    Spouse name: Not on file   Number of children: 1   Years of education: Not on file   Highest education  level: Not on file  Occupational History   Occupation: Truck driver  Tobacco Use   Smoking status: Former    Types: Cigars   Smokeless tobacco: Never   Tobacco comments:    quit 7 years ago  Vaping Use   Vaping Use: Never used  Substance and Sexual Activity   Alcohol use: Not Currently    Comment: ocassionally   Drug use: No   Sexual activity: Not on file  Other Topics Concern   Not on file  Social History Narrative   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  Food Insecurity: No Food Insecurity (04/18/2022)   Hunger Vital Sign    Worried About Running Out of Food in the Last Year: Never true    Ran Out of Food in the Last Year: Never true  Transportation Needs: No Transportation Needs (04/18/2022)   PRAPARE - Transportation    Lack  of Transportation (Medical): No    Lack of Transportation (Non-Medical): No  Physical Activity: Not on file  Stress: Not on file  Social Connections: Not on file     Family History: The patient's family history includes Diabetes in his paternal grandfather; Heart disease in his father and paternal grandfather; Hypertension in his father and mother; Obesity in his mother. There is no history of Colon cancer or Esophageal cancer. ROS:   Please see the history of present illness.    All other systems reviewed and are negative.  EKGs/Labs/Other Studies Reviewed:    The following studies were reviewed today:    Recent Labs: 02/14/2022: ALT 29; Hemoglobin 15.1; NT-Pro BNP 670; Platelets 279; TSH 3.320 04/27/2022: BUN 14; Creatinine, Ser 0.98; Magnesium 2.0; Potassium 4.2; Sodium 137  Recent Lipid Panel    Component Value Date/Time   CHOL 165 05/27/2020 1209   TRIG 105 05/27/2020 1209   HDL 52 05/27/2020 1209   LDLCALC 94 05/27/2020 1209    Physical Exam:    VS:  BP 126/86 (BP Location: Right Arm, Patient Position: Sitting)   Pulse 60   Ht 6' 2.5" (1.892 m)   Wt (!) 382  lb (173.3 kg)   SpO2 97%   BMI 48.39 kg/m     Wt Readings from Last 3 Encounters:  05/09/22 (!) 382 lb (173.3 kg)  05/05/22 (!) 385 lb 12.8 oz (175 kg)  05/04/22 (!) 384 lb 12.8 oz (174.5 kg)     GEN:  Well nourished, well developed in no acute distress HEENT: Normal NECK: No JVD; No carotid bruits LYMPHATICS: No lymphadenopathy CARDIAC: RRR, no murmurs, rubs, gallops I suspect he is in sinus rhythm today RESPIRATORY:  Clear to auscultation without rales, wheezing or rhonchi  ABDOMEN: Soft, non-tender, non-distended MUSCULOSKELETAL:  No edema; No deformity  SKIN: Warm and dry NEUROLOGIC:  Alert and oriented x 3 PSYCHIATRIC:  Normal affect    Signed, Shirlee More, MD  05/09/2022 1:30 PM    [place]

## 2022-05-18 ENCOUNTER — Telehealth: Payer: Self-pay | Admitting: Cardiology

## 2022-05-18 ENCOUNTER — Other Ambulatory Visit (HOSPITAL_COMMUNITY): Payer: Self-pay | Admitting: Nurse Practitioner

## 2022-05-18 NOTE — Telephone Encounter (Signed)
Patient is calling to check on the progress of his paperwork that is being completed.   He also has questions about lab work required for cardiac rehab  Please call [phone]

## 2022-05-18 NOTE — Telephone Encounter (Signed)
Called the patient and informed him that his paperwork had been completed by Dr. Bettina Gavia and that he needed to complete the first page and then we could fax it for him. Patient stated that he would come by the office and complete the first page later today. Patient also stated that the cardiac rehab had told him that he needed to get blood work done but he did not know what blood work they needed. He stated that he had a lot of paperwork from the cardiac rehab. I asked him to bring it by the office and we would look through it to see what kind of blood work he needed. Patient verbalized understanding and had no further questions at this time.

## 2022-05-19 ENCOUNTER — Telehealth (HOSPITAL_COMMUNITY): Payer: Self-pay | Admitting: *Deleted

## 2022-05-19 LAB — CBC
Hematocrit: 47.7 % (ref 37.5–51.0)
Hemoglobin: 16.6 g/dL (ref 13.0–17.7)
MCH: 30.5 pg (ref 26.6–33.0)
MCHC: 34.8 g/dL (ref 31.5–35.7)
MCV: 88 fL (ref 79–97)
Platelets: 290 10*3/uL (ref 150–450)
RBC: 5.45 x10E6/uL (ref 4.14–5.80)
RDW: 13.8 % (ref 11.6–15.4)
WBC: 6.5 10*3/uL (ref 3.4–10.8)

## 2022-05-19 LAB — BASIC METABOLIC PANEL
BUN/Creatinine Ratio: 12 (ref 9–20)
BUN: 12 mg/dL (ref 6–24)
CO2: 23 mmol/L (ref 20–29)
Calcium: 9.3 mg/dL (ref 8.7–10.2)
Chloride: 104 mmol/L (ref 96–106)
Creatinine, Ser: 0.99 mg/dL (ref 0.76–1.27)
Glucose: 95 mg/dL (ref 70–99)
Potassium: 4.7 mmol/L (ref 3.5–5.2)
Sodium: 141 mmol/L (ref 134–144)
eGFR: 91 mL/min/{1.73_m2} (ref >59)

## 2022-05-19 LAB — MAGNESIUM: Magnesium: 2.1 mg/dL (ref 1.6–2.3)

## 2022-05-22 NOTE — Anesthesia Preprocedure Evaluation (Signed)
Anesthesia Evaluation  Patient identified by MRN, date of birth, ID band Patient awake    Reviewed: Allergy & Precautions, NPO status , Patient's Chart, lab work & pertinent test results  Airway Mallampati: II  TM Distance: >3 FB Neck ROM: Full    Dental no notable dental hx. (+) Teeth Intact, Dental Advisory Given   Pulmonary sleep apnea , former smoker   Pulmonary exam normal breath sounds clear to auscultation       Cardiovascular hypertension, Pt. on medications and Pt. on home beta blockers + angina  + CAD  Normal cardiovascular exam+ dysrhythmias (xarelto) Atrial Fibrillation  Rhythm:Irregular Rate:Normal  TTE 2023 1. Left ventricular ejection fraction, by estimation, is 30 to 35%. The  left ventricle has severely decreased function. The left ventricle  demonstrates global hypokinesis. The left ventricular internal cavity size  was mildly dilated. There is moderate  concentric left ventricular hypertrophy. Left ventricular diastolic  parameters are indeterminate.   2. Right ventricular systolic function is normal. The right ventricular  size is not well visualized.   3. Left atrial size was moderately dilated.   4. The mitral valve is normal in structure. No evidence of mitral valve  regurgitation. No evidence of mitral stenosis.   5. The aortic valve is tricuspid. Aortic valve regurgitation is not  visualized. No aortic stenosis is present.   6. There is mild dilatation of the aortic root and of the ascending  aorta, measuring 39 mm.   7. The inferior vena cava is normal in size with greater than 50%  respiratory variability, suggesting right atrial pressure of 3 mmHg     Neuro/Psych  PSYCHIATRIC DISORDERS Anxiety Depression    negative neurological ROS     GI/Hepatic Neg liver ROS,GERD  ,,  Endo/Other    Morbid obesity  Renal/GU negative Renal ROS  negative genitourinary   Musculoskeletal negative  musculoskeletal ROS (+)    Abdominal   Peds  Hematology negative hematology ROS (+)   Anesthesia Other Findings   Reproductive/Obstetrics                             Anesthesia Physical Anesthesia Plan  ASA: 3  Anesthesia Plan: General   Post-op Pain Management:    Induction: Intravenous  PONV Risk Score and Plan: Propofol infusion and Treatment may vary due to age or medical condition  Airway Management Planned: Natural Airway  Additional Equipment:   Intra-op Plan:   Post-operative Plan:   Informed Consent: I have reviewed the patients History and Physical, chart, labs and discussed the procedure including the risks, benefits and alternatives for the proposed anesthesia with the patient or authorized representative who has indicated his/her understanding and acceptance.     Dental advisory given  Plan Discussed with: CRNA  Anesthesia Plan Comments:        Anesthesia Quick Evaluation

## 2022-05-23 ENCOUNTER — Ambulatory Visit (HOSPITAL_BASED_OUTPATIENT_CLINIC_OR_DEPARTMENT_OTHER): Payer: 59 | Admitting: Anesthesiology

## 2022-05-23 ENCOUNTER — Ambulatory Visit (HOSPITAL_COMMUNITY)
Admission: RE | Admit: 2022-05-23 | Discharge: 2022-05-23 | Disposition: A | Discharge status: Home / Self Care | Payer: 59 | Attending: Cardiovascular Disease | Admitting: Cardiovascular Disease

## 2022-05-23 ENCOUNTER — Other Ambulatory Visit: Payer: Self-pay

## 2022-05-23 ENCOUNTER — Encounter (HOSPITAL_COMMUNITY)
Admission: RE | Disposition: A | Discharge status: Home / Self Care | Payer: Self-pay | Source: Home / Self Care | Attending: Cardiovascular Disease

## 2022-05-23 ENCOUNTER — Ambulatory Visit (HOSPITAL_COMMUNITY): Payer: 59 | Admitting: Anesthesiology

## 2022-05-23 ENCOUNTER — Encounter (HOSPITAL_COMMUNITY): Payer: Self-pay | Admitting: Cardiovascular Disease

## 2022-05-23 DIAGNOSIS — Z79899 Other long term (current) drug therapy: Secondary | ICD-10-CM | POA: Diagnosis not present

## 2022-05-23 DIAGNOSIS — F32A Depression, unspecified: Secondary | ICD-10-CM | POA: Diagnosis not present

## 2022-05-23 DIAGNOSIS — Z91148 Patient's other noncompliance with medication regimen for other reason: Secondary | ICD-10-CM | POA: Insufficient documentation

## 2022-05-23 DIAGNOSIS — I4891 Unspecified atrial fibrillation: Secondary | ICD-10-CM

## 2022-05-23 DIAGNOSIS — Z7901 Long term (current) use of anticoagulants: Secondary | ICD-10-CM | POA: Insufficient documentation

## 2022-05-23 DIAGNOSIS — F419 Anxiety disorder, unspecified: Secondary | ICD-10-CM | POA: Insufficient documentation

## 2022-05-23 DIAGNOSIS — I110 Hypertensive heart disease with heart failure: Secondary | ICD-10-CM | POA: Insufficient documentation

## 2022-05-23 DIAGNOSIS — K219 Gastro-esophageal reflux disease without esophagitis: Secondary | ICD-10-CM | POA: Insufficient documentation

## 2022-05-23 DIAGNOSIS — I2510 Atherosclerotic heart disease of native coronary artery without angina pectoris: Secondary | ICD-10-CM | POA: Insufficient documentation

## 2022-05-23 DIAGNOSIS — I5042 Chronic combined systolic (congestive) and diastolic (congestive) heart failure: Secondary | ICD-10-CM | POA: Insufficient documentation

## 2022-05-23 DIAGNOSIS — G4733 Obstructive sleep apnea (adult) (pediatric): Secondary | ICD-10-CM | POA: Diagnosis not present

## 2022-05-23 DIAGNOSIS — Z6841 Body Mass Index (BMI) 40.0 and over, adult: Secondary | ICD-10-CM | POA: Insufficient documentation

## 2022-05-23 DIAGNOSIS — Z87891 Personal history of nicotine dependence: Secondary | ICD-10-CM | POA: Diagnosis not present

## 2022-05-23 DIAGNOSIS — I25119 Atherosclerotic heart disease of native coronary artery with unspecified angina pectoris: Secondary | ICD-10-CM

## 2022-05-23 DIAGNOSIS — E7800 Pure hypercholesterolemia, unspecified: Secondary | ICD-10-CM | POA: Diagnosis not present

## 2022-05-23 DIAGNOSIS — I10 Essential (primary) hypertension: Secondary | ICD-10-CM

## 2022-05-23 DIAGNOSIS — I480 Paroxysmal atrial fibrillation: Secondary | ICD-10-CM | POA: Insufficient documentation

## 2022-05-23 DIAGNOSIS — I4819 Other persistent atrial fibrillation: Secondary | ICD-10-CM

## 2022-05-23 DIAGNOSIS — I420 Dilated cardiomyopathy: Secondary | ICD-10-CM | POA: Diagnosis not present

## 2022-05-23 HISTORY — PX: CARDIOVERSION: SHX1299

## 2022-05-23 SURGERY — CARDIOVERSION
Anesthesia: General

## 2022-05-23 MED ORDER — LIDOCAINE 2% (20 MG/ML) 5 ML SYRINGE
INTRAMUSCULAR | Status: DC | PRN
  Administered 2022-05-23: 100 mg via INTRAVENOUS

## 2022-05-23 MED ORDER — PROPOFOL 10 MG/ML IV BOLUS
INTRAVENOUS | Status: DC | PRN
  Administered 2022-05-23: 100 mg via INTRAVENOUS

## 2022-05-23 MED ORDER — SODIUM CHLORIDE 0.9 % IV SOLN: INTRAVENOUS | Status: DC | PRN

## 2022-05-23 MED ORDER — PHENYLEPHRINE 80 MCG/ML (10ML) SYRINGE FOR IV PUSH (FOR BLOOD PRESSURE SUPPORT)
PREFILLED_SYRINGE | INTRAVENOUS | Status: DC | PRN
  Administered 2022-05-23: 80 ug via INTRAVENOUS
  Administered 2022-05-23: 160 ug via INTRAVENOUS
  Administered 2022-05-23: 80 ug via INTRAVENOUS

## 2022-05-23 MED ORDER — SODIUM CHLORIDE 0.9 % IV SOLN: INTRAVENOUS | Status: DC

## 2022-05-23 NOTE — Discharge Instructions (Signed)

## 2022-05-23 NOTE — Anesthesia Procedure Notes (Signed)
Procedure Name: General with mask airway Date/Time: 05/23/2022 9:15 AM  Performed by: Harden Mo, CRNAPre-anesthesia Checklist: Patient identified, Emergency Drugs available, Suction available and Patient being monitored Patient Re-evaluated:Patient Re-evaluated prior to induction Oxygen Delivery Method: Ambu bag Preoxygenation: Pre-oxygenation with 100% oxygen Induction Type: IV induction Placement Confirmation: positive ETCO2 and breath sounds checked- equal and bilateral Dental Injury: Teeth and Oropharynx as per pre-operative assessment

## 2022-05-23 NOTE — CV Procedure (Signed)
[place]: Anesthesia: Propofol On Rx anticoagulation with no missed doses  DCC x 4  300J biphasic then 360 J biphasic x 3 Patient failed to convert even with manual AP pressure  Not a single sinus beat recorded  No immediate neurologic sequelae   Patient has f/u with Dr Roselie Skinner MD [facility]

## 2022-05-23 NOTE — Interval H&P Note (Signed)
History and Physical Interval Note:  05/23/2022 9:31 AM  Jesus Barnes  has presented today for surgery, with the diagnosis of AFIB.  The various methods of treatment have been discussed with the patient and family. After consideration of risks, benefits and other options for treatment, the patient has consented to  Procedure(s): CARDIOVERSION (N/A) as a surgical intervention.  The patient's history has been reviewed, patient examined, no change in status, stable for surgery.  I have reviewed the patient's chart and labs.  Questions were answered to the patient's satisfaction.     Jenkins Rouge

## 2022-05-23 NOTE — Transfer of Care (Signed)
Immediate Anesthesia Transfer of Care Note  Patient: Jesus Barnes  Procedure(s) Performed: CARDIOVERSION  Patient Location: Endoscopy Unit  Anesthesia Type:General  Level of Consciousness: awake and alert   Airway & Oxygen Therapy: Patient Spontanous Breathing  Post-op Assessment: Report given to RN, Post -op Vital signs reviewed and stable, and Patient moving all extremities X 4  Post vital signs: Reviewed and stable  Last Vitals:  Vitals Value Taken Time  BP 82/59   Temp    Pulse 74   Resp 14   SpO2 95     Last Pain:  Vitals:   05/23/22 0821  TempSrc: Tympanic         Complications: No notable events documented.

## 2022-05-24 NOTE — Anesthesia Postprocedure Evaluation (Signed)
Anesthesia Post Note  Patient: Jesus Barnes  Procedure(s) Performed: CARDIOVERSION     Patient location during evaluation: Endoscopy Anesthesia Type: General Level of consciousness: awake and alert Pain management: pain level controlled Vital Signs Assessment: post-procedure vital signs reviewed and stable Respiratory status: spontaneous breathing, nonlabored ventilation, respiratory function stable and patient connected to nasal cannula oxygen Cardiovascular status: blood pressure returned to baseline and stable Postop Assessment: no apparent nausea or vomiting Anesthetic complications: no  No notable events documented.  Last Vitals:  Vitals:   05/23/22 1010 05/23/22 1016  BP: 93/66 115/78  Pulse: 69 88  Resp: 18 18  Temp:    SpO2: 96% 96%    Last Pain:  Vitals:   05/23/22 1016  TempSrc:   PainSc: 0-No pain                 Dewitt Judice L Emely Fahy

## 2022-05-26 ENCOUNTER — Encounter (HOSPITAL_COMMUNITY): Payer: Self-pay | Admitting: Cardiovascular Disease

## 2022-05-29 ENCOUNTER — Ambulatory Visit: Payer: 59 | Attending: Cardiology | Admitting: Cardiology

## 2022-05-29 ENCOUNTER — Encounter: Payer: Self-pay | Admitting: Cardiology

## 2022-05-29 VITALS — BP 92/68 | HR 85 | Ht 74.5 in | Wt 371.2 lb

## 2022-05-29 DIAGNOSIS — D6869 Other thrombophilia: Secondary | ICD-10-CM

## 2022-05-29 DIAGNOSIS — I4811 Longstanding persistent atrial fibrillation: Secondary | ICD-10-CM | POA: Diagnosis not present

## 2022-05-29 DIAGNOSIS — I5022 Chronic systolic (congestive) heart failure: Secondary | ICD-10-CM

## 2022-05-29 DIAGNOSIS — Z79899 Other long term (current) drug therapy: Secondary | ICD-10-CM

## 2022-05-29 DIAGNOSIS — G4733 Obstructive sleep apnea (adult) (pediatric): Secondary | ICD-10-CM

## 2022-05-29 NOTE — Progress Notes (Signed)
Electrophysiology Office Note   Date:  05/29/2022   ID:  BUCKY LENKIEWICZ, DOB 05/06/1970, MRN [id number]  PCP:  Janine Limbo, PA-C  Cardiologist:  Bettina Gavia Primary Electrophysiologist:  Ysabel Cowgill Meredith Leeds, MD    Chief Complaint: AF   History of Present Illness: DERIOUS AHMANN is a 52 y.o. male who is being seen today for the evaluation of AF at the request of [place], PA-C. Presenting today for electrophysiology evaluation.  He has a history sniffer atrial fibrillation, hypertension, coronary artery disease, morbid obesity, sleep apnea.  He has had multiple cardioversions in the past.  He had previously been on dofetilide.  He did go back into atrial fibrillation.  He developed a tachycardia mediated cardiomyopathy.  Dofetilide was stopped and he went back into atrial fibrillation.  He was loaded on amiodarone and had a cardioversion.  He was drinking quite a bit at that time.    Unfortunately he remains in atrial fibrillation.  He has been loaded on Tikosyn.  He has had multiple cardioversions but has failed to convert to sinus rhythm.  He has significant weakness and fatigue.  He finds it difficult to do his daily activities.  He is on optimal medical therapy for his heart failure.  His blood pressure is low, but he does not wish to change his medicines at this time.  Today, denies symptoms of palpitations, chest pain, orthopnea, PND, lower extremity edema, claudication, dizziness, presyncope, syncope, bleeding, or neurologic sequela. The patient is tolerating medications without difficulties.     Past Medical History:  Diagnosis Date   Anxiety 09/02/2014   Back pain    Burn    Chest pain    Chronic anticoagulation 10/16/2016   Chronic fatigue syndrome    Depression    Dilated cardiomyopathy ([address]) 10/16/2016   EF 30-35%, 05/15/16 40%, 07/17/18 55-60%   Dysphagia    Esophageal stricture 09/02/2014   Essential hypertension 09/01/2014   GERD (gastroesophageal reflux disease)     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HCC)    High cholesterol    Hypertension    Hypertension, essential 08/08/2018   Hypertensive heart disease 09/01/2014   Joint pain    Mild CAD 10/16/2016   Morbid obesity with body mass index (BMI) of 45.0 to 49.9 in adult [facility]) 04/05/2016   Obstructive sleep apnea    awaiting treatment   On amiodarone therapy 10/16/2016   Palpitation    Paroxysmal atrial fibrillation ([place]) 12/12/2016   Persistent atrial fibrillation ([place]) 09/01/2014   Overview:  CHADS2=1   Sleep apnea    SOB (shortness of breath)    Past Surgical History:  Procedure Laterality Date   APPENDECTOMY     CARDIOVERSION     CARDIOVERSION N/A 12/14/2016   Procedure: CARDIOVERSION;  Surgeon: Evans Lance, MD;  Location: [place] CV LAB;  Service: Cardiovascular;  Laterality: N/A;   CARDIOVERSION N/A 08/15/2018   Procedure: CARDIOVERSION;  Surgeon: Sanda Klein, MD;  Location: [facility] ENDOSCOPY;  Service: Cardiovascular;  Laterality: N/A;   CARDIOVERSION N/A 04/30/2020   Procedure: CARDIOVERSION;  Surgeon: Sanda Klein, MD;  Location: [place];  Service: Cardiovascular;  Laterality: N/A;   CARDIOVERSION N/A 06/02/2020   Procedure: CARDIOVERSION (CATH LAB);  Surgeon: Constance Haw, MD;  Location: [place] CV LAB;  Service: Cardiovascular;  Laterality: N/A;   CARDIOVERSION N/A 04/20/2022   Procedure: CARDIOVERSION;  Surgeon: [name], MD;  Location: [facility] ENDOSCOPY;  Service: Cardiovascular;  Laterality: N/A;   CARDIOVERSION N/A 05/23/2022   Procedure:  CARDIOVERSION;  Surgeon: Josue Hector, MD;  Location: [facility] ENDOSCOPY;  Service: Cardiovascular;  Laterality: N/A;   COLONOSCOPY  06/18/2001    Bx: Tubulovillous adenoma. Done by Dr. Melina Copa. He was told to come back 81yr later, but pt didnot.    ESOPHAGOGASTRODUODENOSCOPY  12/17/2014   mild gastritis. Small antral polyps (likely inflammatory). Status post empiric esophageal dilatation   HERNIA REPAIR     umblical   KNEE SURGERY     SKIN GRAFT      URETHRA SURGERY       Current Outpatient Medications  Medication Sig Dispense Refill   aspirin-acetaminophen-caffeine (EXCEDRIN MIGRAINE) 250-250-65 MG tablet Take 3 tablets by mouth daily as needed for headache.     dapagliflozin propanediol (FARXIGA) 10 MG TABS tablet Take 1 tablet (10 mg total) by mouth daily before breakfast. 30 tablet 5   dofetilide (TIKOSYN) 500 MCG capsule Take 1 capsule (500 mcg total) by mouth 2 (two) times daily. 60 capsule 5   furosemide (LASIX) 40 MG tablet Take 1 tablet (40 mg total) by mouth daily. 90 tablet 3   metoprolol succinate (TOPROL-XL) 100 MG 24 hr tablet Take 1 tablet (100 mg total) by mouth 2 (two) times daily. Take with or immediately following a meal. 60 tablet 3   nitroGLYCERIN (NITROSTAT) 0.4 MG SL tablet Place 0.4 mg under the tongue every 5 (five) minutes as needed for chest pain.     pantoprazole (PROTONIX) 40 MG tablet Take 40 mg by mouth daily.     PARoxetine (PAXIL) 20 MG tablet Take 20 mg by mouth daily.     potassium chloride SA (KLOR-CON M) 20 MEQ tablet Take 1 tablet (20 mEq total) by mouth daily. 30 tablet 5   rivaroxaban (XARELTO) 20 MG TABS tablet Take 1 tablet (20 mg total) by mouth daily with supper. 90 tablet 1   sacubitril-valsartan (ENTRESTO) 49-51 MG Take 1 tablet by mouth 2 (two) times daily. 180 tablet 3   spironolactone (ALDACTONE) 25 MG tablet Take 1 tablet (25 mg total) by mouth daily. 30 tablet 5   diltiazem (CARDIZEM) 30 MG tablet Take 30 mg by mouth every morning.     No current facility-administered medications for this visit.    Allergies:   Lipitor [atorvastatin]   Social History:  The patient  reports that he has quit smoking. His smoking use included cigars. He has never used smokeless tobacco. He reports that he does not currently use alcohol. He reports that he does not use drugs.   Family History:  The patient's family history includes Diabetes in his paternal grandfather; Heart disease in his father and  paternal grandfather; Hypertension in his father and mother; Obesity in his mother.   ROS:  Please see the history of present illness.   Otherwise, review of systems is positive for none.   All other systems are reviewed and negative.   PHYSICAL EXAM: VS:  BP 92/68   Pulse 85   Ht 6' 2.5" (1.892 m)   Wt (!) 371 lb 3.2 oz (168.4 kg)   SpO2 94%   BMI 47.02 kg/m  , BMI Body mass index is 47.02 kg/m. GEN: Well nourished, well developed, in no acute distress  HEENT: normal  Neck: no JVD, carotid bruits, or masses Cardiac: RRR; no murmurs, rubs, or gallops,no edema  Respiratory:  clear to auscultation bilaterally, normal work of breathing GI: soft, nontender, nondistended, + BS MS: no deformity or atrophy  Skin: warm and dry Neuro:  Strength and  sensation are intact Psych: euthymic mood, full affect  EKG:  EKG is ordered today. Personal review of the ekg ordered shows atrial fibrillation   Recent Labs: 02/14/2022: ALT 29; NT-Pro BNP 670; TSH 3.320 05/18/2022: BUN 12; Creatinine, Ser 0.99; Hemoglobin 16.6; Magnesium 2.1; Platelets 290; Potassium 4.7; Sodium 141    Lipid Panel     Component Value Date/Time   CHOL 165 05/27/2020 1209   TRIG 105 05/27/2020 1209   HDL 52 05/27/2020 1209   LDLCALC 94 05/27/2020 1209     Wt Readings from Last 3 Encounters:  05/29/22 (!) 371 lb 3.2 oz (168.4 kg)  05/23/22 (!) 380 lb (172.4 kg)  05/09/22 (!) 382 lb (173.3 kg)      Other studies Reviewed: Additional studies/ records that were reviewed today include: TTE 02/28/22 Review of the above records today demonstrates:   1. Left ventricular ejection fraction, by estimation, is 30 to 35%. The  left ventricle has severely decreased function. The left ventricle  demonstrates global hypokinesis. The left ventricular internal cavity size  was mildly dilated. There is moderate  concentric left ventricular hypertrophy. Left ventricular diastolic  parameters are indeterminate.   2. Right  ventricular systolic function is normal. The right ventricular  size is not well visualized.   3. Left atrial size was moderately dilated.   4. The mitral valve is normal in structure. No evidence of mitral valve  regurgitation. No evidence of mitral stenosis.   5. The aortic valve is tricuspid. Aortic valve regurgitation is not  visualized. No aortic stenosis is present.   6. There is mild dilatation of the aortic root and of the ascending  aorta, measuring 39 mm.   7. The inferior vena cava is normal in size with greater than 50%  respiratory variability, suggesting right atrial pressure of 3 mmHg.    ASSESSMENT AND PLAN:  1.  Longstanding persistent atrial fibrillation: Reports of an ablation in the past.  Left atrium is severely dilated.  Currently on metoprolol, Xarelto, has been loaded on dofetilide.  CHA2DS2-VASc of 3.  He had attempted cardioversion, but did not convert to sinus rhythm.  Jathen Sudano continue dofetilide.  He has stopped drinking and has been compliant with his medications.  He has been trying to lose weight.  He feels quite poorly with significant weakness and fatigue because of his atrial fibrillation and heart failure.  Isidora Laham discuss with our surgeons about the possibility of hybrid surgical ablation.  2.  Morbid obesity: Lifestyle modification with diet and exercise encouraged Body mass index is 47.02 kg/m.  3.  Hypertension: Blood pressure well-controlled  4.  Obstructive sleep apnea: CPAP compliance encouraged.  Has been referred to sleep medicine.  5.  Secondary to coagula state: Currently on Xarelto for atrial fibrillation  6.  High risk medication monitoring: Currently on dofetilide.  QTc remained stable.  Labs within normal limits.  7.  Chronic systolic heart failure: Ejection fraction 30 to 35%.  Feels weak and fatigued.  Currently on optimal medical therapy per primary cardiology.  Cassidi Modesitt reassess once maintenance of sinus rhythm has been achieved.   Current  medicines are reviewed at length with the patient today.   The patient does not have concerns regarding his medicines.  The following changes were made today: start toprol xl, reduce lisinopril, stop metoprolol and diltiazem  Labs/ tests ordered today include:  Orders Placed This Encounter  Procedures   EKG 12-Lead     Disposition:   FU 3 months  Signed,  Keyonna Comunale Meredith Leeds, MD  05/29/2022 12:46 PM     [place] [address] [place] [address] [phone] (office) [phone] (fax)

## 2022-06-13 ENCOUNTER — Telehealth: Payer: Self-pay | Admitting: Cardiology

## 2022-06-13 NOTE — Telephone Encounter (Signed)
  Almyra Free is calling, she said, pt is having low BP response. On admission in cardiac rehab started out 98/60 then exercise response 98/70. She would like to check in with Dr. Bettina Gavia of pt is ok to start rehab tomorrow

## 2022-06-13 NOTE — Telephone Encounter (Signed)
Left message for Almyra Free that Dr. Bettina Gavia said pt could start Rehab tomorrow

## 2022-06-16 ENCOUNTER — Ambulatory Visit (HOSPITAL_BASED_OUTPATIENT_CLINIC_OR_DEPARTMENT_OTHER): Payer: 59 | Attending: Cardiology | Admitting: Cardiology

## 2022-06-16 ENCOUNTER — Telehealth: Payer: Self-pay | Admitting: Cardiology

## 2022-06-16 ENCOUNTER — Other Ambulatory Visit: Payer: Self-pay

## 2022-06-16 VITALS — Ht 74.5 in | Wt 370.0 lb

## 2022-06-16 DIAGNOSIS — I9589 Other hypotension: Secondary | ICD-10-CM

## 2022-06-16 DIAGNOSIS — R0683 Snoring: Secondary | ICD-10-CM | POA: Insufficient documentation

## 2022-06-16 DIAGNOSIS — G4733 Obstructive sleep apnea (adult) (pediatric): Secondary | ICD-10-CM | POA: Diagnosis not present

## 2022-06-16 DIAGNOSIS — I4891 Unspecified atrial fibrillation: Secondary | ICD-10-CM | POA: Insufficient documentation

## 2022-06-16 NOTE — Telephone Encounter (Signed)
Called patient and informed him of Dr. Kem Parkinson recommendation below:  "Hold diuretics spironolactone and Lasix over the weekend let Jesus Barnes know how he feels on Monday.  If his symptoms persist he needs to go to the emergency room.  He needs to keep himself hydrated.  On Monday best for him to have a nurse visit here.  Also get a Chem-7 when he comes in.   Patient verbalized understanding and had no further questions at this time.

## 2022-06-16 NOTE — Telephone Encounter (Signed)
Attempted to call all of the phone numbers listed on the patient's chart. The patient and his wife did not answer the phone. Left messages on all 3 phone numbers for the patient to call the office.

## 2022-06-16 NOTE — Telephone Encounter (Signed)
Pt c/o BP issue: STAT if pt c/o blurred vision, one-sided weakness or slurred speech  1. What are your last 5 BP readings? 65/32  2. Are you having any other symptoms (ex. Dizziness, headache, blurred vision, passed out)? Lightheaded, pre-syncope (2nd episode while exercising)  3. What is your BP issue? Patient is experiencing Exercise Hypotension  20 min of rest and hydration, BP was 94/60 Patient is reporting that it happens frequently at home too

## 2022-06-16 NOTE — Telephone Encounter (Signed)
Patient returning call.

## 2022-06-16 NOTE — Telephone Encounter (Signed)
Called Sherri from [facility] and she reported that the patient was having exercise hypotension. After walking on the treadmill he sat down and told the staff that he was feeling light headed and he felt like he was going to pass out. They checked his blood pressure and it was 65/32. The rehab staff had him rest and hydrate for 20 minutes and his blood pressure came up to 94/60. While hydrating he told the staff that this happens a lot at home as well. Currently he is scheduled to go back to cardiac rehab on Monday. Please advise

## 2022-06-18 NOTE — Procedures (Signed)
   Patient Name: Jesus Barnes, Jesus Barnes Date:06/16/2022 Gender: Male D.O.B: 07-01-1970 Age (years): 35 Referring Provider: Loman Brooklyn, MD Height (inches): 72 Interpreting Physician: Armanda Magic MD, ABSM Weight (lbs): 370 RPSGT: Cherylann Parr BMI: 50 MRN: [id number] Neck Size: 19.50  CLINICAL INFORMATION Sleep Study Type: NPSG  Indication for sleep study: Fatigue, Obesity, Snoring, Witnesses Apnea / Gasping During Sleep  Epworth Sleepiness Score: 6  SLEEP STUDY TECHNIQUE As per the AASM Manual for the Scoring of Sleep and Associated Events v2.3 (April 2016) with a hypopnea requiring 4% desaturations.  The channels recorded and monitored were frontal, central and occipital EEG, electrooculogram (EOG), submentalis EMG (chin), nasal and oral airflow, thoracic and abdominal wall motion, anterior tibialis EMG, snore microphone, electrocardiogram, and pulse oximetry.  MEDICATIONS Medications self-administered by patient taken the night of the study : N/A  SLEEP ARCHITECTURE The study was initiated at 10:03:10 PM and ended at 4:20:37 AM.  Sleep onset time was 13.8 minutes and the sleep efficiency was 83.5%. The total sleep time was 315 minutes.  Stage REM latency was N/A minutes.  The patient spent 1.9% of the night in stage N1 sleep, 98.1% in stage N2 sleep, 0.0% in stage N3 and 0% in REM.  Alpha intrusion was absent.  Supine sleep was 100.00%.  RESPIRATORY PARAMETERS The overall apnea/hypopnea index (AHI) was 5.3 per hour. There were 11 total apneas, including 10 obstructive, 1 central and 0 mixed apneas. There were 17 hypopneas and 16 RERAs.  The AHI during Stage REM sleep was N/A per hour.  AHI while supine was 5.3 per hour.  The mean oxygen saturation was 94.3%. The minimum SpO2 during sleep was 87.0%.  loud snoring was noted during this study.  CARDIAC DATA The 2 lead EKG demonstrated atrial fibrillation The mean heart rate was 87.8 beats per minute. Other EKG  findings include: None.  LEG MOVEMENT DATA The total PLMS were 0 with a resulting PLMS index of 0.0. Associated arousal with leg movement index was 0.0 .  IMPRESSIONS - Mild obstructive sleep apnea occurred during this study (AHI = 5.3/h). - No significant central sleep apnea occurred during this study (CAI = 0.2/h). - Mild oxygen desaturation was noted during this study (Min O2 = 87.0%). - The patient snored with loud snoring volume. - Atrial Fibrillation was noted during this study.  DIAGNOSIS - Obstructive Sleep Apnea (G47.33) - Atrial Fibrillation  RECOMMENDATIONS - Positional therapy avoiding supine position during sleep. - Very mild obstructive sleep apnea. Return to discuss treatment options. - Avoid alcohol, sedatives and other CNS depressants that may worsen sleep apnea and disrupt normal sleep architecture. - Sleep hygiene should be reviewed to assess factors that may improve sleep quality. - Weight management and regular exercise should be initiated or continued if appropriate.  [Electronically signed] 06/18/2022 09:25 PM  Armanda Magic MD, ABSM Diplomate, American Board of Sleep Medicine

## 2022-06-19 ENCOUNTER — Ambulatory Visit: Payer: 59 | Attending: Cardiology

## 2022-06-20 ENCOUNTER — Ambulatory Visit: Payer: 59 | Admitting: Cardiology

## 2022-06-20 LAB — BASIC METABOLIC PANEL
BUN/Creatinine Ratio: 20 (ref 9–20)
BUN: 17 mg/dL (ref 6–24)
CO2: 22 mmol/L (ref 20–29)
Calcium: 9 mg/dL (ref 8.7–10.2)
Chloride: 101 mmol/L (ref 96–106)
Creatinine, Ser: 0.87 mg/dL (ref 0.76–1.27)
Glucose: 76 mg/dL (ref 70–99)
Potassium: 4.6 mmol/L (ref 3.5–5.2)
Sodium: 140 mmol/L (ref 134–144)
eGFR: 103 mL/min/{1.73_m2} (ref >59)

## 2022-06-20 NOTE — Progress Notes (Signed)
   Nurse Visit   Date of Encounter: 06/20/2022 ID: Jesus Barnes, DOB [birth year], MRN [id number]  PCP:  Otila Back   [facility] Providers Cardiologist:  Norman Herrlich, MD Electrophysiologist:  Regan Lemming, MD      Visit Details   VS:  BP 94/62 (BP Location: Right Arm, Patient Position: Sitting, Cuff Size: Normal)   Pulse 84  , BMI There is no height or weight on file to calculate BMI.  Wt Readings from Last 3 Encounters:  06/16/22 (!) 370 lb (167.8 kg)  05/29/22 (!) 371 lb 3.2 oz (168.4 kg)  05/23/22 (!) 380 lb (172.4 kg)     Reason for visit: Vital Signs and Lab Work Performed today: Vitals, Provider consulted and Education Changes (medications, testing, etc.) : Continue to hold Lasix and Spironolactone and set-up Nurse visit for Wednesday afternoon to check vitals Length of Visit: 20 minutes    Medications Adjustments/Labs and Tests Ordered: No orders of the defined types were placed in this encounter.  No orders of the defined types were placed in this encounter.    Signed, Samson Frederic, RN  06/20/2022 8:36 AM

## 2022-06-22 ENCOUNTER — Telehealth: Payer: Self-pay

## 2022-06-22 ENCOUNTER — Other Ambulatory Visit: Payer: Self-pay

## 2022-06-22 ENCOUNTER — Telehealth: Payer: Self-pay | Admitting: *Deleted

## 2022-06-22 MED ORDER — SPIRONOLACTONE 25 MG PO TABS: 12.5000 mg | ORAL_TABLET | Freq: Every day | ORAL | 3 refills | Status: DC

## 2022-06-22 MED ORDER — ENTRESTO 24-26 MG PO TABS: 1.0000 | ORAL_TABLET | Freq: Two times a day (BID) | ORAL | 3 refills | Status: DC

## 2022-06-22 NOTE — Telephone Encounter (Signed)
The patient has been notified of the result and verbalized understanding.  All questions (if any) were answered. Latrelle Dodrill, CMA 06/22/2022 6:16 PM    Will send to Leah to set up OV visit.

## 2022-06-22 NOTE — Telephone Encounter (Signed)
The patient has been notified of the result. Left detailed message on voicemail and informed patient to call back..[name], CMA   

## 2022-06-22 NOTE — Telephone Encounter (Signed)
-----   Message from Gaynelle Cage, [place] sent at 06/19/2022  8:15 AM EDT -----  ----- Message ----- From: Quintella Reichert, MD Sent: 06/18/2022   9:29 PM EDT To: Cv Div Sleep Studies  Patient has very mild OSA - set up OV to discuss treatment options.

## 2022-06-22 NOTE — Telephone Encounter (Signed)
Reviewed the following message from Sherri at [facility] with Dr. Dulce Sellar:  "Called Sherri from [facility] and she reported that the patient was having exercise hypotension. After walking on the treadmill he sat down and told the staff that he was feeling light headed and he felt like he was going to pass out. They checked his blood pressure and it was 65/32. The rehab staff had him rest and hydrate for 20 minutes and his blood pressure came up to 94/60. While hydrating he told the staff that this happens a lot at home as well. Currently he is scheduled to go back to cardiac rehab on Monday."  Dr. Dulce Sellar recommended that the patient stop taking their Lasix and to cut the Spirinolactone dose in half and also to decrease the patient's Entresto to 24-26 twice daily. These changes were made in the patient's chart and the patient was informed and verbalized understanding and had no further questions at this time.

## 2022-06-29 ENCOUNTER — Other Ambulatory Visit: Payer: Self-pay | Admitting: Cardiology

## 2022-07-03 ENCOUNTER — Other Ambulatory Visit: Payer: Self-pay | Admitting: *Deleted

## 2022-07-03 DIAGNOSIS — I4819 Other persistent atrial fibrillation: Secondary | ICD-10-CM

## 2022-07-03 NOTE — Progress Notes (Unsigned)
Cardiology Office Note:    Date:  07/04/2022   ID:  Jesus Barnes, DOB 1970/05/20, MRN [id number]  PCP:  Eunice Blase, PA-C  Cardiologist:  Norman Herrlich, MD    Referring MD: Eunice Blase, PA-C    ASSESSMENT:    1. Paroxysmal atrial fibrillation   2. Chronic anticoagulation   3. High risk medication use   4. Dilated cardiomyopathy   5. Hypotension due to drugs    PLAN:    In order of problems listed above:  Very difficult case he has failed both conventional antiarrhythmic drug therapy including amiodarone and dofetilide and previous EP catheter ablation. Very impaired with his atrial fibrillation his last ejection fraction in December was 30 to 35% recheck echocardiogram continue his current tolerated guideline directed therapy reduced dose Entresto spironolactone SGLT2 inhibitor and beta-blocker with effective heart rate control of his atrial fibrillation Continue anticoagulation He remains on dofetilide I will leave it to EP to decide whether he should continue on antiarrhythmic drugs and A-fib If he fails hybrid attempt EP ablation would consider AV nodal ablation and either CRT or left bundle branch block pacing   Next appointment: 3 months   Medication Adjustments/Labs and Tests Ordered: Current medicines are reviewed at length with the patient today.  Concerns regarding medicines are outlined above.  No orders of the defined types were placed in this encounter.  No orders of the defined types were placed in this encounter.   Chief Complaint  Patient presents with   Hypotension    History of Present Illness:    Jesus Barnes is a 52 y.o. male with a hx of atrial fibrillation with tachycardia induced cardiomyopathy hypertension CAD morbid obesity sleep apnea last seen 05/29/2022.  He has had symptomatic hypotension or cardiac rehabilitation program and is maintaining sinus rhythm on dofetilide.  Compliance with diet, lifestyle and medications: Yes we had to  decrease his GDMT with symptomatic hypotension  He continues on dofetilide although he is in atrial fibrillation He is seeing surgery regarding high-grade atrial fibrillation ablation He stopped going to cardiac rehab because of weakness although he is not having edema shortness of breath or syncope He is lost 60 pounds sleep study was done without significant obstructive sleep apnea and has avoided alcohol 100% Past Medical History:  Diagnosis Date   Anxiety 09/02/2014   Back pain    Burn    Chest pain    Chronic anticoagulation 10/16/2016   Chronic fatigue syndrome    Depression    Dilated cardiomyopathy 10/16/2016   EF 30-35%, 05/15/16 40%, 07/17/18 55-60%   Dysphagia    Esophageal stricture 09/02/2014   Essential hypertension 09/01/2014   GERD (gastroesophageal reflux disease)    Hernia cerebri    High cholesterol    Hypertension    Hypertension, essential 08/08/2018   Hypertensive heart disease 09/01/2014   Joint pain    Mild CAD 10/16/2016   Morbid obesity with body mass index (BMI) of 45.0 to 49.9 in adult 04/05/2016   Obstructive sleep apnea    awaiting treatment   On amiodarone therapy 10/16/2016   Palpitation    Paroxysmal atrial fibrillation 12/12/2016   Persistent atrial fibrillation 09/01/2014   Overview:  CHADS2=1   Sleep apnea    SOB (shortness of breath)     Past Surgical History:  Procedure Laterality Date   APPENDECTOMY     CARDIOVERSION     CARDIOVERSION N/A 12/14/2016   Procedure: CARDIOVERSION;  Surgeon: Marinus Maw, MD;  Location: [facility] INVASIVE CV LAB;  Service: Cardiovascular;  Laterality: N/A;   CARDIOVERSION N/A 08/15/2018   Procedure: CARDIOVERSION;  Surgeon: Thurmon Fair, MD;  Location: [facility] ENDOSCOPY;  Service: Cardiovascular;  Laterality: N/A;   CARDIOVERSION N/A 04/30/2020   Procedure: CARDIOVERSION;  Surgeon: Thurmon Fair, MD;  Location: [facility] ENDOSCOPY;  Service: Cardiovascular;  Laterality: N/A;   CARDIOVERSION N/A 06/02/2020   Procedure: CARDIOVERSION  (CATH LAB);  Surgeon: Regan Lemming, MD;  Location: [facility] INVASIVE CV LAB;  Service: Cardiovascular;  Laterality: N/A;   CARDIOVERSION N/A 04/20/2022   Procedure: CARDIOVERSION;  Surgeon: Chrystie Nose, MD;  Location: [facility] ENDOSCOPY;  Service: Cardiovascular;  Laterality: N/A;   CARDIOVERSION N/A 05/23/2022   Procedure: CARDIOVERSION;  Surgeon: Wendall Stade, MD;  Location: [facility] ENDOSCOPY;  Service: Cardiovascular;  Laterality: N/A;   COLONOSCOPY  06/18/2001    Bx: Tubulovillous adenoma. Done by Dr. Charm Barges. He was told to come back 36yr later, but pt didnot.    ESOPHAGOGASTRODUODENOSCOPY  12/17/2014   mild gastritis. Small antral polyps (likely inflammatory). Status post empiric esophageal dilatation   HERNIA REPAIR     umblical   KNEE SURGERY     SKIN GRAFT     URETHRA SURGERY      Current Medications: Current Meds  Medication Sig   aspirin-acetaminophen-caffeine (EXCEDRIN MIGRAINE) 250-250-65 MG tablet Take 3 tablets by mouth daily as needed for headache.   atorvastatin (LIPITOR) 10 MG tablet Take 10 mg by mouth daily.   dapagliflozin propanediol (FARXIGA) 10 MG TABS tablet Take 1 tablet (10 mg total) by mouth daily before breakfast.   diltiazem (CARDIZEM) 30 MG tablet Take 30 mg by mouth every morning.   dofetilide (TIKOSYN) 500 MCG capsule Take 1 capsule (500 mcg total) by mouth 2 (two) times daily.   metoprolol succinate (TOPROL-XL) 100 MG 24 hr tablet TAKE 1 TABLET (100 MG TOTAL) BY MOUTH 2 TIMES DAILY. TAKE WITH OR IMMEDIATELY FOLLOWING A MEAL.   nitroGLYCERIN (NITROSTAT) 0.4 MG SL tablet Place 0.4 mg under the tongue every 5 (five) minutes as needed for chest pain.   pantoprazole (PROTONIX) 40 MG tablet Take 40 mg by mouth daily.   PARoxetine (PAXIL) 40 MG tablet Take 40 mg by mouth daily.   potassium chloride SA (KLOR-CON M) 20 MEQ tablet Take 1 tablet (20 mEq total) by mouth daily.   rivaroxaban (XARELTO) 20 MG TABS tablet Take 1 tablet (20 mg total) by mouth daily with  supper.   sacubitril-valsartan (ENTRESTO) 24-26 MG Take 1 tablet by mouth 2 (two) times daily.   spironolactone (ALDACTONE) 25 MG tablet Take 0.5 tablets (12.5 mg total) by mouth daily.     Allergies:   Patient has no known allergies.   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 [place]   Social Determinants of Health   Financial Resource Strain: Not on file  Food Insecurity: No Food Insecurity (04/18/2022)   Hunger Vital Sign    Worried About Running Out of Food in the Last Year: Never true    Ran Out of Food in the Last Year: Never true  Transportation Needs: No Transportation Needs (04/18/2022)   PRAPARE - Administrator, Civil Service (Medical): No    Lack of Transportation (Non-Medical): No  Physical Activity: Not on file  Stress: Not on file  Social Connections: Not on file     Family History: The patient's family history includes Diabetes in his paternal grandfather; Heart disease in his father and paternal grandfather; Hypertension in his father and mother; Obesity in his mother. There is no history of Colon cancer or Esophageal cancer. ROS:   Please see the history of present illness.    All other systems reviewed and are negative.  EKGs/Labs/Other Studies Reviewed:    The following studies were reviewed today:  Cardiac Studies & Procedures        ECHOCARDIOGRAM  ECHOCARDIOGRAM COMPLETE 02/28/2022  Narrative ECHOCARDIOGRAM REPORT    Patient Name:   ZAEL SHUMAN Date of Exam: 02/28/2022 Medical Rec #:  [id number]       Height:       74.5 in Accession #:    [id number]      Weight:       402.0 lb Date of Birth:  1970/08/28        BSA:          2.938 m Patient Age:    51 years        BP:           144/100 mmHg Patient Gender: M               HR:           105 bpm. Exam Location:  [facility]  Procedure: 2D Echo, Cardiac Doppler, Color Doppler, Strain Analysis and Intracardiac Opacification Agent  Indications:    Persistent atrial fibrillation (HCC) [I48.19 (ICD-10-CM)]; Hypertensive heart disease with chronic diastolic congestive heart failure (HCC) [I11.0, I50.32 (ICD-10-CM)]  History:        Patient has prior history of Echocardiogram examinations, most recent 07/17/2018. Signs/Symptoms:Fatigue, Chest Pain, Hypertensive Heart Disease and Morbid obesity.  Sonographer:    Louie Boston RDCS Referring Phys: [id number] Baldo Daub   Sonographer Comments: Technically difficult study due to poor echo windows. IMPRESSIONS   1. Left ventricular ejection fraction, by estimation, is 30 to 35%. The left ventricle has severely decreased function. The left ventricle demonstrates global hypokinesis. The left ventricular internal cavity size was mildly dilated. There is moderate concentric left ventricular hypertrophy. Left ventricular diastolic parameters are indeterminate. 2. Right ventricular systolic function is normal. The right ventricular size is not well visualized. 3. Left atrial size was moderately dilated. 4. The mitral valve is normal in structure. No evidence of mitral valve regurgitation. No evidence of mitral stenosis. 5. The aortic valve is tricuspid. Aortic valve regurgitation is not visualized. No aortic stenosis is present. 6. There is mild dilatation of the aortic root and of the ascending aorta, measuring 39 mm. 7. The  inferior vena cava is normal in size with greater than 50% respiratory variability, suggesting right atrial pressure of 3 mmHg.  FINDINGS Left Ventricle: Left ventricular ejection fraction, by estimation, is 30 to 35%. The left ventricle has severely decreased function. The  left ventricle demonstrates global hypokinesis. Definity contrast agent was given IV to delineate the left ventricular endocardial borders. Global longitudinal strain performed but not reported based on interpreter judgement due to suboptimal tracking. The left ventricular internal cavity size was mildly dilated. There is moderate concentric left ventricular hypertrophy. Left ventricular diastolic parameters are indeterminate.  Right Ventricle: The right ventricular size is not well visualized. Right vetricular wall thickness was not assessed. Right ventricular systolic function is normal.  Left Atrium: Left atrial size was moderately dilated.  Right Atrium: Right atrial size was normal in size.  Pericardium: There is no evidence of pericardial effusion.  Mitral Valve: The mitral valve is normal in structure. No evidence of mitral valve regurgitation. No evidence of mitral valve stenosis.  Tricuspid Valve: The tricuspid valve is normal in structure. Tricuspid valve regurgitation is mild . No evidence of tricuspid stenosis.  Aortic Valve: The aortic valve is tricuspid. Aortic valve regurgitation is not visualized. No aortic stenosis is present.  Pulmonic Valve: The pulmonic valve was normal in structure. Pulmonic valve regurgitation is not visualized. No evidence of pulmonic stenosis.  Aorta: The aortic arch was not well visualized. There is mild dilatation of the aortic root and of the ascending aorta, measuring 39 mm.  Venous: A systolic blunting flow pattern is recorded from the right upper pulmonary vein. The inferior vena cava was not well visualized. The inferior vena cava is normal in size with greater than 50%  respiratory variability, suggesting right atrial pressure of 3 mmHg.  IAS/Shunts: No atrial level shunt detected by color flow Doppler.   LEFT VENTRICLE PLAX 2D LVIDd:         6.10 cm      Diastology LVIDs:         5.20 cm      LV e' medial:    7.73 cm/s LV PW:         1.50 cm      LV E/e' medial:  13.2 LV IVS:        1.50 cm      LV e' lateral:   9.45 cm/s LVOT diam:     2.40 cm      LV E/e' lateral: 10.8 LV SV:         56 LV SV Index:   19 LVOT Area:     4.52 cm  LV Volumes (MOD) LV vol d, MOD A2C: 126.0 ml LV vol d, MOD A4C: 144.0 ml LV vol s, MOD A2C: 84.2 ml LV vol s, MOD A4C: 103.0 ml LV SV MOD A2C:     41.8 ml LV SV MOD A4C:     144.0 ml LV SV MOD BP:      41.1 ml  RIGHT VENTRICLE            IVC RV S prime:     7.89 cm/s  IVC diam: 3.50 cm TAPSE (M-mode): 2.0 cm  LEFT ATRIUM              Index        RIGHT ATRIUM           Index LA diam:        5.00 cm  1.70 cm/m   RA Area:     21.80 cm LA Vol (A2C):   135.0 ml 45.95 ml/m  RA Volume:   67.60 ml  23.01 ml/m LA Vol (A4C):   132.0 ml 44.93 ml/m LA Biplane Vol: 136.0 ml 46.29 ml/m AORTIC VALVE LVOT  Vmax:   62.87 cm/s LVOT Vmean:  45.933 cm/s LVOT VTI:    0.125 m  AORTA Ao Root diam:  3.70 cm Ao Sinus diam: 4.20 cm Ao STJ diam:   3.9 cm Ao Asc diam:   3.90 cm Ao Desc diam:  3.00 cm  MITRAL VALVE                TRICUSPID VALVE MV Area (PHT): 4.08 cm     TR Peak grad:   21.5 mmHg MV Decel Time: 186 msec     TR Vmax:        232.00 cm/s MV E velocity: 102.00 cm/s SHUNTS Systemic VTI:  0.12 m Systemic Diam: 2.40 cm  Norman Herrlich MD Electronically signed by Norman Herrlich MD Signature Date/Time: 02/28/2022/5:29:06 PM    Final             EKG 05/29/2022 by EP showed atrial fibrillation controlled ventricular rate  Recent Labs: 02/14/2022: ALT 29; NT-Pro BNP 670; TSH 3.320 05/18/2022: Hemoglobin 16.6; Magnesium 2.1; Platelets 290 06/19/2022: BUN 17; Creatinine, Ser 0.87; Potassium 4.6; Sodium 140   Recent Lipid Panel    Component Value Date/Time   CHOL 165 05/27/2020 1209   TRIG 105 05/27/2020 1209   HDL 52 05/27/2020 1209   LDLCALC 94 05/27/2020 1209    Physical Exam:    VS:  BP 117/79 (BP Location: Right Arm, Patient Position: Sitting)   Pulse 60   Ht 6' 2.5" (1.892 m)   Wt (!) 369 lb (167.4 kg)   SpO2 97%   BMI 46.74 kg/m     Wt Readings from Last 3 Encounters:  07/04/22 (!) 369 lb (167.4 kg)  06/16/22 (!) 370 lb (167.8 kg)  05/29/22 (!) 371 lb 3.2 oz (168.4 kg)     GEN: Obese well nourished, well developed in no acute distress HEENT: Normal NECK: No JVD; No carotid bruits LYMPHATICS: No lymphadenopathy CARDIAC: Irregular rate and rhythm variable first heart sound  RESPIRATORY:  Clear to auscultation without rales, wheezing or rhonchi  ABDOMEN: Soft, non-tender, non-distended MUSCULOSKELETAL:  No edema; No deformity  SKIN: Warm and dry NEUROLOGIC:  Alert and oriented x 3 PSYCHIATRIC:  Normal affect    Signed, Norman Herrlich, MD  07/04/2022 9:41 AM    [facility]

## 2022-07-04 ENCOUNTER — Ambulatory Visit: Payer: 59 | Attending: Cardiology | Admitting: Cardiology

## 2022-07-04 ENCOUNTER — Encounter: Payer: Self-pay | Admitting: Cardiology

## 2022-07-04 VITALS — BP 117/79 | HR 60 | Ht 74.5 in | Wt 369.0 lb

## 2022-07-04 DIAGNOSIS — Z7901 Long term (current) use of anticoagulants: Secondary | ICD-10-CM | POA: Diagnosis not present

## 2022-07-04 DIAGNOSIS — Z79899 Other long term (current) drug therapy: Secondary | ICD-10-CM

## 2022-07-04 DIAGNOSIS — I480 Paroxysmal atrial fibrillation: Secondary | ICD-10-CM | POA: Diagnosis not present

## 2022-07-04 DIAGNOSIS — I952 Hypotension due to drugs: Secondary | ICD-10-CM

## 2022-07-04 DIAGNOSIS — I420 Dilated cardiomyopathy: Secondary | ICD-10-CM | POA: Diagnosis not present

## 2022-07-04 NOTE — Addendum Note (Signed)
Addended by: Roxanne Mins I on: 07/04/2022 09:53 AM   Modules accepted: Orders

## 2022-07-04 NOTE — Patient Instructions (Signed)
Medication Instructions:  Your physician recommends that you continue on your current medications as directed. Please refer to the Current Medication list given to you today.  *If you need a refill on your cardiac medications before your next appointment, please call your pharmacy*   Lab Work: None If you have labs (blood work) drawn today and your tests are completely normal, you will receive your results only by: MyChart Message (if you have MyChart) OR A paper copy in the mail If you have any lab test that is abnormal or we need to change your treatment, we will call you to review the results.   Testing/Procedures: Your physician has requested that you have an echocardiogram. Echocardiography is a painless test that uses sound waves to create images of your heart. It provides your doctor with information about the size and shape of your heart and how well your heart's chambers and valves are working. This procedure takes approximately one hour. There are no restrictions for this procedure. Please do NOT wear cologne, perfume, aftershave, or lotions (deodorant is allowed). Please arrive 15 minutes prior to your appointment time.    Follow-Up: At [facility], you and your health needs are our priority.  As part of our continuing mission to provide you with exceptional heart care, we have created designated Provider Care Teams.  These Care Teams include your primary Cardiologist (physician) and Advanced Practice Providers (APPs -  Physician Assistants and Nurse Practitioners) who all work together to provide you with the care you need, when you need it.  We recommend signing up for the patient portal called "MyChart".  Sign up information is provided on this After Visit Summary.  MyChart is used to connect with patients for Virtual Visits (Telemedicine).  Patients are able to view lab/test results, encounter notes, upcoming appointments, etc.  Non-urgent messages can be sent to your  provider as well.   To learn more about what you can do with MyChart, go to [email or web address].    Your next appointment:   3 month(s)  Provider:   Brian Munley, MD    Other Instructions None  

## 2022-07-06 ENCOUNTER — Institutional Professional Consult (permissible substitution): Payer: 59 | Admitting: Cardiothoracic Surgery

## 2022-07-06 VITALS — BP 84/55 | HR 55 | Resp 20 | Ht 74.5 in | Wt 363.0 lb

## 2022-07-06 DIAGNOSIS — I4819 Other persistent atrial fibrillation: Secondary | ICD-10-CM | POA: Diagnosis not present

## 2022-07-06 NOTE — Progress Notes (Signed)
[address]       [place] [address]             [phone]                    [name] [facility] Medical Record #[id number] Date of Birth: 1970-12-14  Referring: Regan Lemming, MD Primary Care: Eunice Blase, [place] Primary Cardiologist: Norman Herrlich, MD  Chief Complaint:   No chief complaint on file.   History of Present Illness:    Jesus Barnes 52 y.o. male who is seen in consultation regarding atrial fibrillation.   He recalls being 1st in AF back in 2003.  Had cardioversion then, per pt also attempted ablation?? "Wouldn't come surface."  Put in a loop recorder instead.   Never had an ablation before  Has had 10-15 cardioversions.   Back in 2022-2023 reports HR 160 a lot of the time. More recent it is consistently under 100.   Had a cardioversion Feb 30th and felt a lot better. Stays in sinus at most 5 days after that, and that was with tikosyn.  Feels much better every time he gets back in sinus rhythm.   He has been feeling worse the last couple of days.   Scheduled may 7th for cardioversion.   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    Went from 406 to 363. Stopped drinking. Stopped smoking.  Took shot for weight loss in the past he reports, but did not help.    Has had difficulty swallowing in past with strictures but has not has not had trouble with TEE per pt.    Recent cardiology H&P: Jesus Barnes is a 52 y.o. male with a hx of paroxysmal atrial fibrillation with previous catheter ablation chronic anticoagulation hypertensive heart disease with heart failure precipitated by atrial fibrillation mild CAD and unfortunately the setting of recurrent atrial fibrillation noncompliance with medicine recurrent severe LV dysfunction with an ejection fraction in the range of 30 to 35% after previous normalization maintaining sinus rhythm.  He was last seen 03/29/2022.  Most recent has been seen by EP after Tikosyn  admission cardioversion and Dr. Mayford Knife for his obstructive sleep apnea.  He is in the A-fib clinic 04/27/2022 with recurrent atrial fibrillation.   Compliance with diet, lifestyle and medications: Yes   Multifaceted visit He feels very weak deconditioned and after discussion refer to cardiac rehabilitation for heart failure systolic dysfunction cardiomyopathy He thinks that he is in and out of atrial fibrillation. 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 Not having edema shortness of breath syncope or bleeding from his anticoagulant  Past Medical History:  Diagnosis Date   Anxiety 09/02/2014   Back pain    Burn    Chest pain    Chronic anticoagulation 10/16/2016   Chronic fatigue syndrome    Depression    Dilated cardiomyopathy 10/16/2016   EF 30-35%, 05/15/16 40%, 07/17/18 55-60%   Dysphagia    Esophageal stricture 09/02/2014   Essential hypertension 09/01/2014   GERD (gastroesophageal reflux disease)    Hernia cerebri    High cholesterol    Hypertension    Hypertension, essential 08/08/2018   Hypertensive heart disease 09/01/2014   Joint pain    Mild CAD 10/16/2016   Morbid obesity with body mass index (BMI) of 45.0 to 49.9 in adult 04/05/2016  Obstructive sleep apnea    awaiting treatment   On amiodarone therapy 10/16/2016   Palpitation    Paroxysmal atrial fibrillation 12/12/2016   Persistent atrial fibrillation 09/01/2014   Overview:  CHADS2=1   Sleep apnea    SOB (shortness of breath)     Past Surgical History:  Procedure Laterality Date   APPENDECTOMY     CARDIOVERSION     CARDIOVERSION N/A 12/14/2016   Procedure: CARDIOVERSION;  Surgeon: Marinus Maw, MD;  [facility] LAB;  Service: Cardiovascular;  Laterality: N/A;   CARDIOVERSION N/A 08/15/2018   Procedure: CARDIOVERSION;  Surgeon: Thurmon Fair, MD;  Location: [facility] ENDOSCOPY;  Service: Cardiovascular;  Laterality: N/A;   CARDIOVERSION N/A  04/30/2020   Procedure: CARDIOVERSION;  Surgeon: Thurmon Fair, MD;  Location: [facility] ENDOSCOPY;  Service: Cardiovascular;  Laterality: N/A;   CARDIOVERSION N/A 06/02/2020   Procedure: CARDIOVERSION (CATH LAB);  Surgeon: Regan Lemming, MD;  Location: [facility] INVASIVE CV LAB;  Service: Cardiovascular;  Laterality: N/A;   CARDIOVERSION N/A 04/20/2022   Procedure: CARDIOVERSION;  Surgeon: Chrystie Nose, MD;  Location: [facility] ENDOSCOPY;  Service: Cardiovascular;  Laterality: N/A;   CARDIOVERSION N/A 05/23/2022   Procedure: CARDIOVERSION;  Surgeon: Wendall Stade, MD;  Location: [facility] ENDOSCOPY;  Service: Cardiovascular;  Laterality: N/A;   COLONOSCOPY  06/18/2001    Bx: Tubulovillous adenoma. Done by Dr. Charm Barges. He was told to come back 12yr later, but pt didnot.    ESOPHAGOGASTRODUODENOSCOPY  12/17/2014   mild gastritis. Small antral polyps (likely inflammatory). Status post empiric esophageal dilatation   HERNIA REPAIR     umblical   KNEE SURGERY     SKIN GRAFT     URETHRA SURGERY      Family History  Problem Relation Age of Onset   Diabetes Paternal Grandfather    Heart disease Paternal Grandfather    Hypertension Mother    Obesity Mother    Hypertension Father    Heart disease Father    Colon cancer Neg Hx    Esophageal cancer Neg Hx      Social History   Tobacco Use  Smoking Status Former   Types: Cigars  Smokeless Tobacco Never  Tobacco Comments   quit 7 years ago    Social History   Substance and Sexual Activity  Alcohol Use Not Currently   Comment: ocassionally     No Known Allergies  Current Outpatient Medications  Medication Sig Dispense Refill   aspirin-acetaminophen-caffeine (EXCEDRIN MIGRAINE) 250-250-65 MG tablet Take 3 tablets by mouth daily as needed for headache.     atorvastatin (LIPITOR) 10 MG tablet Take 10 mg by mouth daily.     dapagliflozin propanediol (FARXIGA) 10 MG TABS tablet Take 1 tablet (10 mg total) by mouth daily before breakfast. 30 tablet 5    diltiazem (CARDIZEM) 30 MG tablet Take 30 mg by mouth every morning.     dofetilide (TIKOSYN) 500 MCG capsule Take 1 capsule (500 mcg total) by mouth 2 (two) times daily. 60 capsule 5   metoprolol succinate (TOPROL-XL) 100 MG 24 hr tablet TAKE 1 TABLET (100 MG TOTAL) BY MOUTH 2 TIMES DAILY. TAKE WITH OR IMMEDIATELY FOLLOWING A MEAL. 180 tablet 3   nitroGLYCERIN (NITROSTAT) 0.4 MG SL tablet Place 0.4 mg under the tongue every 5 (five) minutes as needed for chest pain.     pantoprazole (PROTONIX) 40 MG tablet Take 40 mg by mouth daily.     PARoxetine (PAXIL) 40 MG tablet Take 40 mg by  mouth daily.     potassium chloride SA (KLOR-CON M) 20 MEQ tablet Take 1 tablet (20 mEq total) by mouth daily. 30 tablet 5   rivaroxaban (XARELTO) 20 MG TABS tablet Take 1 tablet (20 mg total) by mouth daily with supper. 90 tablet 1   sacubitril-valsartan (ENTRESTO) 24-26 MG Take 1 tablet by mouth 2 (two) times daily. 90 tablet 3   spironolactone (ALDACTONE) 25 MG tablet Take 0.5 tablets (12.5 mg total) by mouth daily. 45 tablet 3   No current facility-administered medications for this visit.    ROS 14 point ROS reviewed and negative except as per HPI   PHYSICAL EXAMINATION: There were no vitals taken for this visit.  Gen: NAD, large body habitus Neuro: Alert and oriented CV irregular Resp: Nonlaboured Abd: Soft, ntnd, obese Extr: WWP  Diagnostic Studies & Laboratory data:     Recent Radiology Findings:   SLEEP STUDY DOCUMENTS  Result Date: 06/20/2022 Ordered by an unspecified provider.      I have independently reviewed the above radiology studies  and reviewed the findings with the patient.   Recent Lab Findings: Lab Results  Component Value Date   WBC 6.5 05/18/2022   HGB 16.6 05/18/2022   HCT 47.7 05/18/2022   PLT 290 05/18/2022   GLUCOSE 76 06/19/2022   CHOL 165 05/27/2020   TRIG 105 05/27/2020   HDL 52 05/27/2020   LDLCALC 94 05/27/2020   ALT 29 02/14/2022   AST 26 02/14/2022    NA 140 06/19/2022   K 4.6 06/19/2022   CL 101 06/19/2022   CREATININE 0.87 06/19/2022   BUN 17 06/19/2022   CO2 22 06/19/2022   TSH 3.320 02/14/2022   INR 1.3 (H) 06/02/2020   HGBA1C 5.4 05/27/2020     Assessment / Plan:   Jesus Barnes 52 y.o. male who is seen in consultation regarding atrial fibrillation.   He looks overall to be in a heart failure exacerbation His BP is ~80/40 on todays visit and he reports feeling worse for several days. There is also a component it seems of being more limited in terms of his activities is bothering him. Presents today with his wife  Several ways this could managed. Going to talk to Dr. Elberta Fortis.  We will think about an rhythm 1st strategy or more of the ICD/HF route.  He see's Camnitz on May 13th  I put a clinic visit to see pt back in 1 month. I'll probably be calling him before that with a plan.   I told him the following info as well specific to convergent:     Typical workup includes: CT chest: would need as last was years ago Echo in the last year: already completed Dec 2023  We will need to think about the foley as well as he'd had meatal issues in the past.   We will also consider concurrent loop recorder removal with convergent if we do that.     We broadly discussed the rationale for the convergent approach.  Standard recovery is walking around day after procedure, discharge to home postop day 2.  I see convergent (epicardial ablation) patients postoperatively back in the clinic in 1 week and 1 month. Patients generally have a full recovery by 3 weeks.    Risks/benefits/alternatives discussed at length. All questions were asked an answered. Risks are <<1% mortality, 2% morbidity (bleeding, infection, damage to surrounding structures, myocardial infarction given prior stents) and >97% standard recovery.  I told this patient specifically  that if we pursue this strategy in the context of an acute heart failure exacerbation the risks  would be at least double what we usually think. He may also need    Typically I do these procedures on Mondays, last dose of Eliquis is Saturday morning. I restart AC the night of surgery. If they receive an LAA clip, most patients, but not all, come off their AC 2-6 months after surgery at the discretion of their referring cardiologist. Many ultimately have reductions in the degree of antiarrythmic medications as well. Expected outcome is significantly more freedom from Afib (measured by 90% reduction of time in AF).  I  spent 60 minutes with  the patient face to face in counseling and coordination of care.    Jesus Barnes 07/06/2022 8:21 AM

## 2022-07-18 ENCOUNTER — Ambulatory Visit: Payer: 59 | Attending: Cardiology

## 2022-07-18 ENCOUNTER — Other Ambulatory Visit (HOSPITAL_COMMUNITY): Payer: Self-pay

## 2022-07-18 DIAGNOSIS — Z7901 Long term (current) use of anticoagulants: Secondary | ICD-10-CM

## 2022-07-18 DIAGNOSIS — I420 Dilated cardiomyopathy: Secondary | ICD-10-CM

## 2022-07-18 DIAGNOSIS — Z79899 Other long term (current) drug therapy: Secondary | ICD-10-CM

## 2022-07-18 DIAGNOSIS — I480 Paroxysmal atrial fibrillation: Secondary | ICD-10-CM

## 2022-07-18 DIAGNOSIS — I952 Hypotension due to drugs: Secondary | ICD-10-CM

## 2022-07-18 LAB — ECHOCARDIOGRAM COMPLETE
Area-P 1/2: 4.15 cm2
S' Lateral: 4 cm

## 2022-07-18 MED ORDER — PERFLUTREN LIPID MICROSPHERE
1.0000 mL | INTRAVENOUS | Status: AC | PRN
  Administered 2022-07-18: 7 mL via INTRAVENOUS

## 2022-07-24 ENCOUNTER — Ambulatory Visit: Payer: 59 | Attending: Cardiology | Admitting: Cardiology

## 2022-07-24 ENCOUNTER — Encounter: Payer: Self-pay | Admitting: Cardiology

## 2022-07-24 VITALS — BP 102/72 | HR 78 | Ht 74.5 in | Wt 375.2 lb

## 2022-07-24 DIAGNOSIS — I4819 Other persistent atrial fibrillation: Secondary | ICD-10-CM

## 2022-07-24 NOTE — Progress Notes (Signed)
Electrophysiology Office Note   Date:  07/24/2022   ID:  Jesus Barnes, DOB 12/25/1970, MRN [id number]  PCP:  Eunice Blase, PA-C  Cardiologist:  Dulce Sellar Primary Electrophysiologist:  Olufemi Mofield Jorja Loa, MD    Chief Complaint: AF   History of Present Illness: Jesus Barnes is a 52 y.o. male who is being seen today for the evaluation of AF at the request of Barnes, [name], PA-C. Presenting today for electrophysiology evaluation.  He has a history significant for atrial fibrillation, hypertension, coronary artery disease, morbid obesity, sleep apnea.  He has had multiple cardioversions in the past.  He is previously been on dofetilide but went back into atrial fibrillation.  He had developed a tachycardia mediated cardiomyopathy.  Dofetilide was stopped and he went back into atrial fibrillation.  He was loaded on amiodarone but continued to have atrial fibrillation.  Today, denies symptoms of palpitations, chest pain, orthopnea, PND, lower extremity edema, claudication, dizziness, presyncope, syncope, bleeding, or neurologic sequela. The patient is tolerating medications without difficulties.  His main complaints today are shortness of breath and fatigue.  He finds it difficult to do just about any of his daily activities.  He feels quite dizzy when he has to pick up anything that is heavy.  He is quite frustrated with his overall health.    Past Medical History:  Diagnosis Date   Anxiety 09/02/2014   Back pain    Burn    Chest pain    Chronic anticoagulation 10/16/2016   Chronic fatigue syndrome    Depression    Dilated cardiomyopathy (HCC) 10/16/2016   EF 30-35%, 05/15/16 40%, 07/17/18 55-60%   Dysphagia    Esophageal stricture 09/02/2014   Essential hypertension 09/01/2014   GERD (gastroesophageal reflux disease)    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HCC)    High cholesterol    Hypertension    Hypertension, essential 08/08/2018   Hypertensive heart disease 09/01/2014   Joint pain    Mild CAD  10/16/2016   Morbid obesity with body mass index (BMI) of 45.0 to 49.9 in adult [facility]) 04/05/2016   Obstructive sleep apnea    awaiting treatment   On amiodarone therapy 10/16/2016   Palpitation    Paroxysmal atrial fibrillation (HCC) 12/12/2016   Persistent atrial fibrillation (HCC) 09/01/2014   Overview:  CHADS2=1   Sleep apnea    SOB (shortness of breath)    Past Surgical History:  Procedure Laterality Date   APPENDECTOMY     CARDIOVERSION     CARDIOVERSION N/A 12/14/2016   Procedure: CARDIOVERSION;  Surgeon: Marinus Maw, MD;  [facility] INVAS[facility] CV LAB;  Service: Cardiovascular;  Laterality: N/A;   CARDIOVERSION N/A 08/15/2018   Procedure: CARDIOVERSION;  Surgeon: Thurmon Fair, MD;  Location: [facility] ENDOSCOPY;  Service: Cardiovascular;  Laterality: N/A;   CARDIOVERSION N/A 04/30/2020   Procedure: CARDIOVERSION;  Surgeon: Thurmon Fair, MD;  Location: [facility] ENDOSCOPY;  Service: Cardiovascular;  Laterality: N/A;   CARDIOVERSION N/A 06/02/2020   Procedure: CARDIOVERSION (CATH LAB);  Surgeon: Regan Lemming, MD;  Location: [facility] INVASIVE CV LAB;  Service: Cardiovascular;  Laterality: N/A;   CARDIOVERSION N/A 04/20/2022   Procedure: CARDIOVERSION;  Surgeon: Chrystie Nose, MD;  Location: [facility] ENDOSCOPY;  Service: Cardiovascular;  Laterality: N/A;   CARDIOVERSION N/A 05/23/2022   Procedure: CARDIOVERSION;  Surgeon: Wendall Stade, MD;  Location: [facility] ENDOSCOPY;  Service: Cardiovascular;  Laterality: N/A;   COLONOSCOPY  06/18/2001    Bx: Tubulovillous adenoma. Done by Dr. Charm Barges. He was told to  come back 65yr later, but pt didnot.    ESOPHAGOGASTRODUODENOSCOPY  12/17/2014   mild gastritis. Small antral polyps (likely inflammatory). Status post empiric esophageal dilatation   HERNIA REPAIR     umblical   KNEE SURGERY     SKIN GRAFT     URETHRA SURGERY       Current Outpatient Medications  Medication Sig Dispense Refill   aspirin-acetaminophen-caffeine (EXCEDRIN MIGRAINE) 250-250-65 MG  tablet Take 3 tablets by mouth daily as needed for headache.     atorvastatin (LIPITOR) 10 MG tablet Take 10 mg by mouth daily.     dapagliflozin propanediol (FARXIGA) 10 MG TABS tablet Take 1 tablet (10 mg total) by mouth daily before breakfast. 30 tablet 5   diltiazem (CARDIZEM) 30 MG tablet Take 30 mg by mouth every morning.     dofetilide (TIKOSYN) 500 MCG capsule Take 1 capsule (500 mcg total) by mouth 2 (two) times daily. 60 capsule 5   metoprolol succinate (TOPROL-XL) 100 MG 24 hr tablet TAKE 1 TABLET (100 MG TOTAL) BY MOUTH 2 TIMES DAILY. TAKE WITH OR IMMEDIATELY FOLLOWING A MEAL. 180 tablet 3   nitroGLYCERIN (NITROSTAT) 0.4 MG SL tablet Place 0.4 mg under the tongue every 5 (five) minutes as needed for chest pain.     pantoprazole (PROTONIX) 40 MG tablet Take 40 mg by mouth daily.     PARoxetine (PAXIL) 40 MG tablet Take 40 mg by mouth daily.     potassium chloride SA (KLOR-CON M) 20 MEQ tablet Take 1 tablet (20 mEq total) by mouth daily. 30 tablet 5   rivaroxaban (XARELTO) 20 MG TABS tablet Take 1 tablet (20 mg total) by mouth daily with supper. 90 tablet 1   sacubitril-valsartan (ENTRESTO) 24-26 MG Take 1 tablet by mouth 2 (two) times daily. 90 tablet 3   spironolactone (ALDACTONE) 25 MG tablet Take 0.5 tablets (12.5 mg total) by mouth daily. (Patient taking differently: Take 12.5 mg by mouth daily. Has been taking 25 mg daily) 45 tablet 3   temazepam (RESTORIL) 15 MG capsule Take 15 mg by mouth at bedtime as needed for sleep.     No current facility-administered medications for this visit.    Allergies:   Patient has no known allergies.   Social History:  The patient  reports that he has quit smoking. His smoking use included cigars. He has never used smokeless tobacco. He reports that he does not currently use alcohol. He reports that he does not use drugs.   Family History:  The patient's family history includes Diabetes in his paternal grandfather; Heart disease in his father and  paternal grandfather; Hypertension in his father and mother; Obesity in his mother.   ROS:  Please see the history of present illness.   Otherwise, review of systems is positive for none.   All other systems are reviewed and negative.   PHYSICAL EXAM: VS:  BP 102/72   Pulse 78   Ht 6' 2.5" (1.892 m)   Wt (!) 375 lb 3.2 oz (170.2 kg)   SpO2 97%   BMI 47.53 kg/m  , BMI Body mass index is 47.53 kg/m. GEN: Well nourished, well developed, in no acute distress  HEENT: normal  Neck: no JVD, carotid bruits, or masses Cardiac: Irregular; no murmurs, rubs, or gallops,no edema  Respiratory:  clear to auscultation bilaterally, normal work of breathing GI: soft, nontender, nondistended, + BS MS: no deformity or atrophy  Skin: warm and dry Neuro:  Strength and sensation are intact  Psych: euthymic mood, full affect  EKG:  EKG is ordered today. Personal review of the ekg ordered shows atrial fibrillation    Recent Labs: 02/14/2022: ALT 29; NT-Pro BNP 670; TSH 3.320 05/18/2022: Hemoglobin 16.6; Magnesium 2.1; Platelets 290 06/19/2022: BUN 17; Creatinine, Ser 0.87; Potassium 4.6; Sodium 140    Lipid Panel     Component Value Date/Time   CHOL 165 05/27/2020 1209   TRIG 105 05/27/2020 1209   HDL 52 05/27/2020 1209   LDLCALC 94 05/27/2020 1209     Wt Readings from Last 3 Encounters:  07/24/22 (!) 375 lb 3.2 oz (170.2 kg)  07/06/22 (!) 363 lb (164.7 kg)  07/04/22 (!) 369 lb (167.4 kg)      Other studies Reviewed: Additional studies/ records that were reviewed today include: TTE 02/28/22 Review of the above records today demonstrates:   1. Left ventricular ejection fraction, by estimation, is 30 to 35%. The  left ventricle has severely decreased function. The left ventricle  demonstrates global hypokinesis. The left ventricular internal cavity size  was mildly dilated. There is moderate  concentric left ventricular hypertrophy. Left ventricular diastolic  parameters are indeterminate.    2. Right ventricular systolic function is normal. The right ventricular  size is not well visualized.   3. Left atrial size was moderately dilated.   4. The mitral valve is normal in structure. No evidence of mitral valve  regurgitation. No evidence of mitral stenosis.   5. The aortic valve is tricuspid. Aortic valve regurgitation is not  visualized. No aortic stenosis is present.   6. There is mild dilatation of the aortic root and of the ascending  aorta, measuring 39 mm.   7. The inferior vena cava is normal in size with greater than 50%  respiratory variability, suggesting right atrial pressure of 3 mmHg.    ASSESSMENT AND PLAN:  1.  Longstanding persistent atrial fibrillation: Left atrium severely dilated.  Currently on metoprolol, Xarelto.  CHA2DS2-VASc of 3.  He feels quite poorly in atrial fibrillation.  He is already on dofetilide, but has continued to have episodes.  I told him that we could certainly move forward with ablation, though his chance of success is close to 60%.  Despite these onset, he would like to move forward.  Risk, benefits, and alternatives to EP study and radiofrequency ablation for afib were also discussed in detail today. These risks include but are not limited to stroke, bleeding, vascular damage, tamponade, perforation, damage to the esophagus, lungs, and other structures, pulmonary vein stenosis, worsening renal function, and death. The patient understands these risk and wishes to proceed.  We Verdine Grenfell therefore proceed with catheter ablation at the next available time.  Carto, ICE, anesthesia are requested for the procedure.  Martavius Lusty also obtain CT PV protocol prior to the procedure to exclude LAA thrombus and further evaluate atrial anatomy.   2.  Morbid obesity: Lifestyle modification encouraged Body mass index is 47.53 kg/m.  3.  Hypertension:well controlled  4.  Chronic systolic heart failure: Ejection fraction 30 to 35%.  Has weakness and fatigue.   Optimal medical therapy per primary cardiology.  Once rhythm control has been established, Deveron Shamoon need repeat echo.  5.  Secondary hypercoagulable state: Currently on Xarelto for atrial fibrillation  6.  High risk medication monitoring: Currently on dofetilide.  QTc acceptable.  Labs within normal limits.  Case discussed with primary cardiology  Current medicines are reviewed at length with the patient today.   The patient does not have  concerns regarding his medicines.  The following changes were made today: None  Labs/ tests ordered today include:  Orders Placed This Encounter  Procedures   CT CARDIAC MORPH/PULM VEIN W/CM&W/O CA SCORE   EKG 12-Lead     Disposition:   FU 6 months  Signed, Merrilyn Legler Jorja Loa, MD  07/24/2022 9:34 AM     [facility] [address] [place] [address] [phone] (office) [phone] (fax)

## 2022-07-24 NOTE — Patient Instructions (Signed)
Medication Instructions:  Your physician recommends that you continue on your current medications as directed. Please refer to the Current Medication list given to you today.  *If you need a refill on your cardiac medications before your next appointment, please call your pharmacy*   Lab Work: Pre procedure labs -- see procedure instruction letter:  BMP & CBC  If you have labs (blood work) drawn today and your tests are completely normal, you will receive your results only by: MyChart Message (if you have MyChart) OR A paper copy in the mail If you have any lab test that is abnormal or we need to change your treatment, we will call you to review the results.   Testing/Procedures: Your physician has requested that you have cardiac CT within 7 days PRIOR to your ablation. Cardiac computed tomography (CT) is a painless test that uses an x-ray machine to take clear, detailed pictures of your heart.  Please follow instruction below located under "other instructions". You will get a call from our office to schedule the date for this test.  Your physician has recommended that you have an ablation. Catheter ablation is a medical procedure used to treat some cardiac arrhythmias (irregular heartbeats). During catheter ablation, a long, thin, flexible tube is put into a blood vessel in your groin (upper thigh), or neck. This tube is called an ablation catheter. It is then guided to your heart through the blood vessel. Radio frequency waves destroy small areas of heart tissue where abnormal heartbeats may cause an arrhythmia to start. Please follow instruction letter given to you today.  You will be scheduled for 12/01/2022.   The EP scheduler, April, will reach out at a later date to go over CT & ablation instructions and send via mychart.    Follow-Up: At [facility], you and your health needs are our priority.  As part of our continuing mission to provide you with exceptional heart care, we have  created designated Provider Care Teams.  These Care Teams include your primary Cardiologist (physician) and Advanced Practice Providers (APPs -  Physician Assistants and Nurse Practitioners) who all work together to provide you with the care you need, when you need it.    Your next appointment:   1 month(s) after your ablation  The format for your next appointment:   In Person  Provider:   [facility] clinic   Thank you for choosing [facility]!!   Dory Horn, RN [phone]    Other Instructions   Cardiac Ablation Cardiac ablation is a procedure to destroy (ablate) some heart tissue that is sending bad signals. These bad signals cause problems in heart rhythm. The heart has many areas that make these signals. If there are problems in these areas, they can make the heart beat in a way that is not normal. Destroying some tissues can help make the heart rhythm normal. Tell your doctor about: Any allergies you have. All medicines you are taking. These include vitamins, herbs, eye drops, creams, and over-the-counter medicines. Any problems you or family members have had with medicines that make you fall asleep (anesthetics). Any blood disorders you have. Any surgeries you have had. Any medical conditions you have, such as kidney failure. Whether you are pregnant or may be pregnant. What are the risks? This is a safe procedure. But problems may occur, including: Infection. Bruising and bleeding. Bleeding into the chest. Stroke or blood clots. Damage to nearby areas of your body. Allergies to medicines or dyes. The need  for a pacemaker if the normal system is damaged. Failure of the procedure to treat the problem. What happens before the procedure? Medicines Ask your doctor about: Changing or stopping your normal medicines. This is important. Taking aspirin and ibuprofen. Do not take these medicines unless your doctor tells you to take them. Taking other medicines,  vitamins, herbs, and supplements. General instructions Follow instructions from your doctor about what you cannot eat or drink. Plan to have someone take you home from the hospital or clinic. If you will be going home right after the procedure, plan to have someone with you for 24 hours. Ask your doctor what steps will be taken to prevent infection. What happens during the procedure?  An IV tube will be put into one of your veins. You will be given a medicine to help you relax. The skin on your neck or groin will be numbed. A cut (incision) will be made in your neck or groin. A needle will be put through your cut and into a large vein. A tube (catheter) will be put into the needle. The tube will be moved to your heart. Dye may be put through the tube. This helps your doctor see your heart. Small devices (electrodes) on the tube will send out signals. A type of energy will be used to destroy some heart tissue. The tube will be taken out. Pressure will be held on your cut. This helps stop bleeding. A bandage will be put over your cut. The exact procedure may vary among doctors and hospitals. What happens after the procedure? You will be watched until you leave the hospital or clinic. This includes checking your heart rate, breathing rate, oxygen, and blood pressure. Your cut will be watched for bleeding. You will need to lie still for a few hours. Do not drive for 24 hours or as long as your doctor tells you. Summary Cardiac ablation is a procedure to destroy some heart tissue. This is done to treat heart rhythm problems. Tell your doctor about any medical conditions you may have. Tell him or her about all medicines you are taking to treat them. This is a safe procedure. But problems may occur. These include infection, bruising, bleeding, and damage to nearby areas of your body. Follow what your doctor tells you about food and drink. You may also be told to change or stop some of your  medicines. After the procedure, do not drive for 24 hours or as long as your doctor tells you. This information is not intended to replace advice given to you by your health care provider. Make sure you discuss any questions you have with your health care provider. Document Revised: 05/20/2021 Document Reviewed: 01/30/2019 Elsevier Patient Education  2023 [employer].

## 2022-08-01 NOTE — Addendum Note (Signed)
Addended by: Baird Lyons on: 08/01/2022 11:29 AM   Modules accepted: Orders

## 2022-08-03 ENCOUNTER — Ambulatory Visit: Payer: 59 | Admitting: Cardiothoracic Surgery

## 2022-08-23 ENCOUNTER — Other Ambulatory Visit: Payer: Self-pay | Admitting: Cardiology

## 2022-08-23 NOTE — Telephone Encounter (Signed)
Rx sent to pharmacy   

## 2022-09-25 ENCOUNTER — Ambulatory Visit: Payer: 59 | Admitting: Cardiology

## 2022-09-28 ENCOUNTER — Other Ambulatory Visit: Payer: Self-pay

## 2022-10-02 NOTE — Progress Notes (Unsigned)
Cardiology Office Note:    Date:  10/03/2022   ID:  Jesus Barnes, DOB [birth year], MRN [id number]  PCP:  Eunice Blase, PA-C  Cardiologist:  Norman Herrlich, MD    Referring MD: Eunice Blase, PA-C    ASSESSMENT:    1. Persistent atrial fibrillation (HCC)   2. Chronic anticoagulation   3. High risk medication use   4. Dilated cardiomyopathy (HCC)    PLAN:    In order of problems listed above:  His atrial fibrillation has been quite resistant to management its obviously persistent he wants another opinion and request to be referred to [facility] for evaluation Continue anticoagulation his antiarrhythmic drug for now Continue good guideline directed therapy with his cardiomyopathy EF is mildly reduced Fortunately not using alcohol Unfortunately unable to lose weight   Next appointment: 6 months after EP evaluation   Medication Adjustments/Labs and Tests Ordered: Current medicines are reviewed at length with the patient today.  Concerns regarding medicines are outlined above.  Orders Placed This Encounter  Procedures   Ambulatory referral to Cardiac Electrophysiology   EKG 12-Lead   No orders of the defined types were placed in this encounter.    History of Present Illness:    Jesus Barnes is a 52 y.o. male with a hx of paroxysmal atrial fibrillation with tachycardia induced cardiomyopathy hypertension CAD morbid obesity and obstructive sleep apnea last seen 07/04/2022.  He is scheduled for EP ablation atrial fibrillation 12/01/2022  He had an echocardiogram performed after the visit showing mild left ventricular dysfunction EF 45 to 50% normal right ventricular size and function and no valvular abnormality.  Compliance with diet, lifestyle and medications: Yes  He does not feel well he is displaced that he remains in atrial fibrillation does not want further cardioversions and is concerned that the CT surgeon who is going to work with Dr. Elberta Fortis to do the hybrid ablation  is no longer associated with [facility] Friend went to do PA request referral to Dr. Sedalia Muta electrophysiologist at [facility] He is compliant with his medications remains on Tikosyn and thinks he is in atrial fibrillation continuously unguinal leave him on his antiarrhythmic drug until he is seen in consultation is tough to judge his QT interval from the EKG but in V5 it is clearly in the range of 360 ms with a heart rate of 100 210 bpm He is compliant with his medications including anticoagulant guideline directed therapy SGLT2 inhibitor beta-blocker Entresto and spironolactone his EF is mildly reduced and he has had no bleeding complication from his anticoagulant. He is not using alcohol. Recent labs 07/05/2022 cholesterol 155 LDL 89 A1c 5.2 hemoglobin 11.9 creatinine 0.87 potassium 4.5 Past Medical History:  Diagnosis Date   A-fib (HCC) 04/18/2022   Angina pectoris (HCC) 04/05/2016   Anxiety 09/02/2014   Back pain    Burn    Chest pain    Chronic anticoagulation 10/16/2016   Chronic fatigue syndrome    Depression    Dilated cardiomyopathy (HCC) 10/16/2016   EF 30-35%, 05/15/16 40%, 07/17/18 55-60%   Dysphagia    Esophageal stricture 09/02/2014   Essential hypertension 09/01/2014   GERD (gastroesophageal reflux disease)    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HCC)    High cholesterol    High risk medication use 09/01/2014   Overview:   Flecanide     Hypertension    Hypertension, essential 08/08/2018   Hypertensive heart disease 09/01/2014   Joint pain    Mild CAD 10/16/2016  Morbid obesity with body mass index (BMI) of 45.0 to 49.9 in adult [facility]) 04/05/2016   Obstructive sleep apnea    awaiting treatment   On amiodarone therapy 10/16/2016   Palpitation    Paroxysmal atrial fibrillation (HCC) 12/12/2016   Persistent atrial fibrillation (HCC) 09/01/2014   Overview:  CHADS2=1   Sleep apnea    SOB (shortness of breath)     Current Medications: Current Meds  Medication Sig    aspirin-acetaminophen-caffeine (EXCEDRIN MIGRAINE) 250-250-65 MG tablet Take 3 tablets by mouth daily as needed for headache.   atorvastatin (LIPITOR) 10 MG tablet Take 10 mg by mouth daily.   dapagliflozin propanediol (FARXIGA) 10 MG TABS tablet Take 1 tablet (10 mg total) by mouth daily before breakfast.   diltiazem (CARDIZEM) 30 MG tablet Take 30 mg by mouth every morning.   dofetilide (TIKOSYN) 500 MCG capsule Take 1 capsule (500 mcg total) by mouth 2 (two) times daily.   ENTRESTO 49-51 MG Take 1 tablet by mouth 2 (two) times daily.   furosemide (LASIX) 40 MG tablet Take 40 mg by mouth daily.   metoprolol succinate (TOPROL-XL) 100 MG 24 hr tablet TAKE 1 TABLET (100 MG TOTAL) BY MOUTH 2 TIMES DAILY. TAKE WITH OR IMMEDIATELY FOLLOWING A MEAL.   metoprolol tartrate (LOPRESSOR) 50 MG tablet Take 50 mg by mouth 3 (three) times daily.   nitroGLYCERIN (NITROSTAT) 0.4 MG SL tablet Place 0.4 mg under the tongue every 5 (five) minutes as needed for chest pain.   pantoprazole (PROTONIX) 40 MG tablet Take 40 mg by mouth daily.   PARoxetine (PAXIL) 40 MG tablet Take 40 mg by mouth daily.   potassium chloride SA (KLOR-CON M) 20 MEQ tablet Take 1 tablet (20 mEq total) by mouth daily.   rivaroxaban (XARELTO) 20 MG TABS tablet Take 1 tablet (20 mg total) by mouth daily with supper.   spironolactone (ALDACTONE) 25 MG tablet TAKE 1 TABLET (25 MG TOTAL) BY MOUTH DAILY.   temazepam (RESTORIL) 15 MG capsule Take 15 mg by mouth at bedtime as needed for sleep.      EKGs/Labs/Other Studies Reviewed:    The following studies were reviewed today:  Cardiac Studies & Procedures       ECHOCARDIOGRAM  ECHOCARDIOGRAM COMPLETE 07/18/2022  Narrative ECHOCARDIOGRAM REPORT    Patient Name:   Jesus Barnes Date of Exam: 07/18/2022 Medical Rec #:  [id number]       Height:       74.5 in Accession #:    [id number]      Weight:       363.0 lb Date of Birth:  23-Oct-1970        BSA:          2.813 m Patient Age:    52  years        BP:           84/55 mmHg Patient Gender: M               HR:           92 bpm. Exam Location:  [place]  Procedure: 2D Echo, Cardiac Doppler, Color Doppler, Strain Analysis and Intracardiac Opacification Agent  Indications:    Paroxysmal atrial fibrillation (HCC) [I48.0 (ICD-10-CM)]; Chronic anticoagulation [Z79.01 (ICD-10-CM)]; High risk medication use [Z79.899 (ICD-10-CM)]; Dilated cardiomyopathy (HCC) [I42.0 (ICD-10-CM)]; Hypotension due to drugs [I95.2 (ICD-10-CM)]  History:        Patient has prior history of Echocardiogram examinations, most recent 02/28/2022. Cardiomyopathy, Arrythmias:Atrial Fibrillation, Signs/Symptoms:Hypotension;  Risk Factors:Former Smoker.  Sonographer:    Margreta Journey RDCS Referring Phys: [id number] Aashritha Miedema J [facility]   Sonographer Comments: Technically difficult study due to poor echo windows. Image acquisition challenging due to patient body habitus. IMPRESSIONS   1. Left ventricular ejection fraction, by estimation, is 45 to 50%. The left ventricle has mildly decreased function. Left ventricular endocardial border not optimally defined to evaluate regional wall motion. There is mild left ventricular hypertrophy. Left ventricular diastolic parameters are indeterminate. 2. Right ventricular systolic function is normal. The right ventricular size is normal. 3. The mitral valve is normal in structure. No evidence of mitral valve regurgitation. No evidence of mitral stenosis. 4. The aortic valve is normal in structure. Aortic valve regurgitation is not visualized. No aortic stenosis is present. 5. The inferior vena cava is normal in size with greater than 50% respiratory variability, suggesting right atrial pressure of 3 mmHg.  FINDINGS Left Ventricle: Left ventricular ejection fraction, by estimation, is 45 to 50%. The left ventricle has mildly decreased function. Left ventricular endocardial border not optimally defined to evaluate regional  wall motion. Definity contrast agent was given IV to delineate the left ventricular endocardial borders. The left ventricular internal cavity size was normal in size. There is mild left ventricular hypertrophy. Left ventricular diastolic parameters are indeterminate.  Right Ventricle: The right ventricular size is normal. No increase in right ventricular wall thickness. Right ventricular systolic function is normal.  Left Atrium: Left atrial size was normal in size.  Right Atrium: Right atrial size was normal in size.  Pericardium: There is no evidence of pericardial effusion.  Mitral Valve: The mitral valve is normal in structure. No evidence of mitral valve regurgitation. No evidence of mitral valve stenosis.  Tricuspid Valve: The tricuspid valve is normal in structure. Tricuspid valve regurgitation is not demonstrated. No evidence of tricuspid stenosis.  Aortic Valve: The aortic valve is normal in structure. Aortic valve regurgitation is not visualized. No aortic stenosis is present.  Pulmonic Valve: The pulmonic valve was normal in structure. Pulmonic valve regurgitation is not visualized. No evidence of pulmonic stenosis.  Aorta: The aortic root is normal in size and structure.  Venous: The inferior vena cava is normal in size with greater than 50% respiratory variability, suggesting right atrial pressure of 3 mmHg.  IAS/Shunts: No atrial level shunt detected by color flow Doppler.   LEFT VENTRICLE PLAX 2D LVIDd:         5.00 cm   Diastology LVIDs:         4.00 cm   LV e' medial:    16.00 cm/s LV PW:         1.20 cm   LV E/e' medial:  4.6 LV IVS:        1.20 cm   LV e' lateral:   16.60 cm/s LVOT diam:     2.50 cm   LV E/e' lateral: 4.5 LV SV:         64 LV SV Index:   23 LVOT Area:     4.91 cm   RIGHT VENTRICLE RV Basal diam:  4.40 cm RV Mid diam:    4.50 cm RV S prime:     9.95 cm/s TAPSE (M-mode): 1.7 cm  LEFT ATRIUM             Index        RIGHT ATRIUM            Index LA diam:  4.90 cm 1.74 cm/m   RA Area:     23.10 cm LA Vol (A2C):   79.1 ml 28.12 ml/m  RA Volume:   67.70 ml  24.07 ml/m LA Vol (A4C):   83.8 ml 29.79 ml/m LA Biplane Vol: 84.8 ml 30.15 ml/m AORTIC VALVE LVOT Vmax:   73.40 cm/s LVOT Vmean:  55.900 cm/s LVOT VTI:    0.130 m  AORTA Ao Root diam: 4.40 cm Ao Asc diam:  3.80 cm Ao Desc diam: 3.20 cm  MITRAL VALVE MV Area (PHT): 4.15 cm    SHUNTS MV Decel Time: 183 msec    Systemic VTI:  0.13 m MV E velocity: 74.40 cm/s  Systemic Diam: 2.50 cm  Gypsy Balsam MD Electronically signed by Gypsy Balsam MD Signature Date/Time: 07/18/2022/5:33:45 PM    Final            EKG Interpretation Date/Time:  Tuesday October 03 2022 10:11:26 EDT Ventricular Rate:  104 PR Interval:    QRS Duration:  100 QT Interval:  398 QTC Calculation: 523 R Axis:   -7  Text Interpretation: Atrial fibrillation with rapid ventricular response QT interval is difficult to measure because of atrial fibrillation course baseline best seen in V5 is normal at 360 ms especially with rapid heart rate. When compared with ECG of 04-May-2022 10:15, No significant change was found Confirmed by Norman Herrlich ([id number]) on 10/03/2022 10:22:33 AM   Recent Labs: 02/14/2022: ALT 29; NT-Pro BNP 670; TSH 3.320 05/18/2022: Hemoglobin 16.6; Magnesium 2.1; Platelets 290 06/19/2022: BUN 17; Creatinine, Ser 0.87; Potassium 4.6; Sodium 140  Recent Lipid Panel    Component Value Date/Time   CHOL 165 05/27/2020 1209   TRIG 105 05/27/2020 1209   HDL 52 05/27/2020 1209   LDLCALC 94 05/27/2020 1209    Physical Exam:    VS:  BP 118/70   Pulse (!) 104   Ht 6' 2.6" (1.895 m)   Wt (!) 392 lb 12.8 oz (178.2 kg)   SpO2 97%   BMI 49.62 kg/m     Wt Readings from Last 3 Encounters:  10/03/22 (!) 392 lb 12.8 oz (178.2 kg)  07/24/22 (!) 375 lb 3.2 oz (170.2 kg)  07/06/22 (!) 363 lb (164.7 kg)     GEN:  Well nourished, well developed in no acute distress HEENT:  Normal NECK: No JVD; No carotid bruits LYMPHATICS: No lymphadenopathy CARDIAC: Quite obese BMI approaches 50 irregular rate and rhythm variable first heart sound, no murmurs, rubs, gallops RESPIRATORY:  Clear to auscultation without rales, wheezing or rhonchi  ABDOMEN: Soft, non-tender, non-distended MUSCULOSKELETAL:  No edema; No deformity  SKIN: Warm and dry NEUROLOGIC:  Alert and oriented x 3 PSYCHIATRIC:  Normal affect    Signed, Norman Herrlich, MD  10/03/2022 10:38 AM    [facility]

## 2022-10-03 ENCOUNTER — Encounter: Payer: Self-pay | Admitting: Cardiology

## 2022-10-03 ENCOUNTER — Ambulatory Visit: Payer: BC Managed Care – PPO | Attending: Cardiology | Admitting: Cardiology

## 2022-10-03 VITALS — BP 118/70 | HR 104 | Ht 74.6 in | Wt 392.8 lb

## 2022-10-03 DIAGNOSIS — I420 Dilated cardiomyopathy: Secondary | ICD-10-CM | POA: Diagnosis not present

## 2022-10-03 DIAGNOSIS — I4819 Other persistent atrial fibrillation: Secondary | ICD-10-CM | POA: Diagnosis not present

## 2022-10-03 DIAGNOSIS — Z79899 Other long term (current) drug therapy: Secondary | ICD-10-CM

## 2022-10-03 DIAGNOSIS — Z7901 Long term (current) use of anticoagulants: Secondary | ICD-10-CM

## 2022-10-03 NOTE — Patient Instructions (Signed)

## 2022-10-06 ENCOUNTER — Telehealth: Payer: Self-pay

## 2022-10-06 NOTE — Telephone Encounter (Signed)
Called patient and informed him that Dr. Dulce Sellar had completed his disablity paperwork so he doesn't have to pay his car insurance. Patient verbalized understanding and had no further questions at this time.

## 2022-10-11 ENCOUNTER — Other Ambulatory Visit: Payer: Self-pay

## 2022-10-11 DIAGNOSIS — I4819 Other persistent atrial fibrillation: Secondary | ICD-10-CM

## 2022-10-12 ENCOUNTER — Other Ambulatory Visit: Payer: Self-pay | Admitting: Physician Assistant

## 2022-10-12 MED ORDER — POTASSIUM CHLORIDE CRYS ER 20 MEQ PO TBCR: 20.0000 meq | EXTENDED_RELEASE_TABLET | Freq: Every day | ORAL | 9 refills | Status: DC

## 2022-10-24 ENCOUNTER — Other Ambulatory Visit: Payer: Self-pay | Admitting: Cardiology

## 2022-10-30 ENCOUNTER — Telehealth: Payer: Self-pay | Admitting: Cardiology

## 2022-10-30 NOTE — Telephone Encounter (Signed)
Patient's wife is calling because we sent a referral to see Dr. Jean Rosenthal at [facility]. Patient's wife stated she call their office and they did not receive the referral. Patient's wife is requesting we resend the referral via fax at [phone]. Please advise.

## 2022-11-01 ENCOUNTER — Other Ambulatory Visit: Payer: Self-pay | Admitting: Physician Assistant

## 2022-11-06 NOTE — Telephone Encounter (Signed)
Referral has been manually printed and refaxed/kbl 11/06/22

## 2022-11-24 ENCOUNTER — Ambulatory Visit (HOSPITAL_COMMUNITY): Admission: RE | Admit: 2022-11-24 | Payer: 59 | Source: Ambulatory Visit

## 2022-11-24 ENCOUNTER — Telehealth: Payer: Self-pay | Admitting: Cardiology

## 2022-11-24 ENCOUNTER — Other Ambulatory Visit: Payer: Self-pay | Admitting: Cardiology

## 2022-11-24 ENCOUNTER — Encounter (HOSPITAL_COMMUNITY): Payer: Self-pay

## 2022-11-24 NOTE — Telephone Encounter (Signed)
Error

## 2022-11-27 ENCOUNTER — Telehealth (HOSPITAL_COMMUNITY): Payer: Self-pay | Admitting: *Deleted

## 2022-11-27 NOTE — Telephone Encounter (Signed)
Reaching out to patient to offer assistance regarding upcoming cardiac imaging study; pt verbalizes understanding of appt date/time, parking situation and where to check in, pre-test NPO status  and verified current allergies; name and call back number provided for further questions should they arise ? ?Larey Brick RN Navigator Cardiac Imaging ?[facility] ?[phone] office ?[phone] cell ? ?

## 2022-11-28 ENCOUNTER — Other Ambulatory Visit: Payer: Self-pay

## 2022-11-28 ENCOUNTER — Ambulatory Visit (HOSPITAL_BASED_OUTPATIENT_CLINIC_OR_DEPARTMENT_OTHER)
Admission: RE | Admit: 2022-11-28 | Discharge: 2022-11-28 | Disposition: A | Discharge status: Home / Self Care | Payer: 59 | Source: Ambulatory Visit | Attending: Cardiology | Admitting: Cardiology

## 2022-11-28 DIAGNOSIS — I4819 Other persistent atrial fibrillation: Secondary | ICD-10-CM | POA: Insufficient documentation

## 2022-11-28 LAB — POCT I-STAT CREATININE: Creatinine, Ser: 1.1 mg/dL (ref 0.61–1.24)

## 2022-11-28 MED ORDER — IOHEXOL 350 MG/ML SOLN
100.0000 mL | Freq: Once | INTRAVENOUS | Status: AC | PRN
  Administered 2022-11-28: 80 mL via INTRAVENOUS

## 2022-11-30 NOTE — Pre-Procedure Instructions (Signed)
Instructed patient on the following items: Arrival time 515 Nothing to eat or drink after midnight No meds AM of procedure Responsible person to drive you home and stay with you for 24 hrs  Have you missed any doses of anti-coagulant Xarelto-takes once a day, hasn't missed any doses.  Don't take dose in the morning.

## 2022-11-30 NOTE — Anesthesia Preprocedure Evaluation (Addendum)
Anesthesia Evaluation  Patient identified by MRN, date of birth, ID band Patient awake    Reviewed: Allergy & Precautions, H&P , NPO status , Patient's Chart, lab work & pertinent test results  Airway Mallampati: III  TM Distance: >3 FB Neck ROM: Full    Dental no notable dental hx. (+) Teeth Intact, Dental Advisory Given   Pulmonary neg pulmonary ROS, Patient abstained from smoking., former smoker   Pulmonary exam normal breath sounds clear to auscultation       Cardiovascular Exercise Tolerance: Good hypertension, Pt. on medications and Pt. on home beta blockers + dysrhythmias Atrial Fibrillation  Rhythm:Regular Rate:Normal     Neuro/Psych   Anxiety Depression    negative neurological ROS     GI/Hepatic Neg liver ROS,GERD  Medicated,,  Endo/Other  negative endocrine ROS    Renal/GU negative Renal ROS  negative genitourinary   Musculoskeletal   Abdominal   Peds  Hematology negative hematology ROS (+)   Anesthesia Other Findings   Reproductive/Obstetrics negative OB ROS                             Anesthesia Physical Anesthesia Plan  ASA: 3  Anesthesia Plan: General   Post-op Pain Management: Tylenol PO (pre-op)*   Induction: Intravenous  PONV Risk Score and Plan: 3 and Ondansetron, Dexamethasone and Midazolam  Airway Management Planned: Oral ETT  Additional Equipment:   Intra-op Plan:   Post-operative Plan: Extubation in OR  Informed Consent: I have reviewed the patients History and Physical, chart, labs and discussed the procedure including the risks, benefits and alternatives for the proposed anesthesia with the patient or authorized representative who has indicated his/her understanding and acceptance.     Dental advisory given  Plan Discussed with: CRNA  Anesthesia Plan Comments:        Anesthesia Quick Evaluation

## 2022-12-01 ENCOUNTER — Other Ambulatory Visit: Payer: Self-pay

## 2022-12-01 ENCOUNTER — Ambulatory Visit (HOSPITAL_COMMUNITY)
Admission: RE | Admit: 2022-12-01 | Discharge: 2022-12-01 | Disposition: A | Discharge status: Home / Self Care | Payer: 59 | Attending: Cardiology | Admitting: Cardiology

## 2022-12-01 ENCOUNTER — Encounter (HOSPITAL_COMMUNITY): Payer: Self-pay | Admitting: Cardiology

## 2022-12-01 ENCOUNTER — Ambulatory Visit (HOSPITAL_COMMUNITY): Payer: 59 | Admitting: Anesthesiology

## 2022-12-01 ENCOUNTER — Encounter (HOSPITAL_COMMUNITY)
Admission: RE | Disposition: A | Discharge status: Home / Self Care | Payer: Self-pay | Source: Home / Self Care | Attending: Cardiology

## 2022-12-01 DIAGNOSIS — R000 Tachycardia, unspecified: Secondary | ICD-10-CM | POA: Insufficient documentation

## 2022-12-01 DIAGNOSIS — I10 Essential (primary) hypertension: Secondary | ICD-10-CM | POA: Insufficient documentation

## 2022-12-01 DIAGNOSIS — I4819 Other persistent atrial fibrillation: Secondary | ICD-10-CM

## 2022-12-01 DIAGNOSIS — I4811 Longstanding persistent atrial fibrillation: Secondary | ICD-10-CM | POA: Insufficient documentation

## 2022-12-01 DIAGNOSIS — G4730 Sleep apnea, unspecified: Secondary | ICD-10-CM | POA: Insufficient documentation

## 2022-12-01 DIAGNOSIS — Z87891 Personal history of nicotine dependence: Secondary | ICD-10-CM | POA: Diagnosis not present

## 2022-12-01 DIAGNOSIS — I2510 Atherosclerotic heart disease of native coronary artery without angina pectoris: Secondary | ICD-10-CM | POA: Insufficient documentation

## 2022-12-01 DIAGNOSIS — Z6841 Body Mass Index (BMI) 40.0 and over, adult: Secondary | ICD-10-CM | POA: Diagnosis not present

## 2022-12-01 HISTORY — PX: ATRIAL FIBRILLATION ABLATION: EP1191

## 2022-12-01 LAB — POCT ACTIVATED CLOTTING TIME
Activated Clotting Time: 287 seconds
Activated Clotting Time: 293 seconds
Activated Clotting Time: 293 seconds

## 2022-12-01 SURGERY — ATRIAL FIBRILLATION ABLATION
Anesthesia: General

## 2022-12-01 MED ORDER — ACETAMINOPHEN 325 MG PO TABS
650.0000 mg | ORAL_TABLET | ORAL | Status: DC | PRN
  Administered 2022-12-01: 650 mg via ORAL

## 2022-12-01 MED ORDER — SUGAMMADEX SODIUM 200 MG/2ML IV SOLN
INTRAVENOUS | Status: DC | PRN
  Administered 2022-12-01: 400 mg via INTRAVENOUS

## 2022-12-01 MED ORDER — ONDANSETRON HCL 4 MG/2ML IJ SOLN: 4.0000 mg | Freq: Four times a day (QID) | INTRAMUSCULAR | Status: DC | PRN

## 2022-12-01 MED ORDER — PROTAMINE SULFATE 10 MG/ML IV SOLN
INTRAVENOUS | Status: DC | PRN
Start: 2022-12-01 — End: 2022-12-01
  Administered 2022-12-01: 40 mg via INTRAVENOUS

## 2022-12-01 MED ORDER — ONDANSETRON HCL 4 MG/2ML IJ SOLN
INTRAMUSCULAR | Status: DC | PRN
  Administered 2022-12-01: 4 mg via INTRAVENOUS

## 2022-12-01 MED ORDER — ROCURONIUM BROMIDE 10 MG/ML (PF) SYRINGE
PREFILLED_SYRINGE | INTRAVENOUS | Status: DC | PRN
  Administered 2022-12-01: 20 mg via INTRAVENOUS
  Administered 2022-12-01: 40 mg via INTRAVENOUS
  Administered 2022-12-01: 20 mg via INTRAVENOUS

## 2022-12-01 MED ORDER — SODIUM CHLORIDE 0.9% FLUSH: 3.0000 mL | INTRAVENOUS | Status: DC | PRN

## 2022-12-01 MED ORDER — LACTATED RINGERS IV SOLN
INTRAVENOUS | Status: DC | PRN
Start: 2022-12-01 — End: 2022-12-01

## 2022-12-01 MED ORDER — ATROPINE SULFATE 1 MG/10ML IJ SOSY
PREFILLED_SYRINGE | INTRAMUSCULAR | Status: AC
  Filled 2022-12-01: qty 10

## 2022-12-01 MED ORDER — FENTANYL CITRATE (PF) 100 MCG/2ML IJ SOLN
50.0000 ug | Freq: Once | INTRAMUSCULAR | Status: AC
  Administered 2022-12-01: 50 ug via INTRAVENOUS
  Filled 2022-12-01: qty 2

## 2022-12-01 MED ORDER — LIDOCAINE 2% (20 MG/ML) 5 ML SYRINGE: INTRAMUSCULAR | Status: DC | PRN

## 2022-12-01 MED ORDER — SUCCINYLCHOLINE CHLORIDE 200 MG/10ML IV SOSY
PREFILLED_SYRINGE | INTRAVENOUS | Status: DC | PRN
  Administered 2022-12-01: 180 mg via INTRAVENOUS

## 2022-12-01 MED ORDER — ACETAMINOPHEN 500 MG PO TABS
1000.0000 mg | ORAL_TABLET | Freq: Once | ORAL | Status: AC
  Administered 2022-12-01: 1000 mg via ORAL
  Filled 2022-12-01: qty 2

## 2022-12-01 MED ORDER — PROPOFOL 10 MG/ML IV BOLUS
INTRAVENOUS | Status: DC | PRN
  Administered 2022-12-01: 180 mg via INTRAVENOUS

## 2022-12-01 MED ORDER — FENTANYL CITRATE (PF) 250 MCG/5ML IJ SOLN
INTRAMUSCULAR | Status: DC | PRN
  Administered 2022-12-01 (×2): 50 ug via INTRAVENOUS

## 2022-12-01 MED ORDER — HEPARIN (PORCINE) IN NACL 1000-0.9 UT/500ML-% IV SOLN
INTRAVENOUS | Status: DC | PRN
  Administered 2022-12-01 (×4): 500 mL

## 2022-12-01 MED ORDER — MIDAZOLAM HCL 2 MG/2ML IJ SOLN
INTRAMUSCULAR | Status: DC | PRN
  Administered 2022-12-01: 2 mg via INTRAVENOUS

## 2022-12-01 MED ORDER — LIDOCAINE 2% (20 MG/ML) 5 ML SYRINGE
INTRAMUSCULAR | Status: DC | PRN
  Administered 2022-12-01: 80 mg via INTRAVENOUS

## 2022-12-01 MED ORDER — PHENYLEPHRINE 80 MCG/ML (10ML) SYRINGE FOR IV PUSH (FOR BLOOD PRESSURE SUPPORT)
PREFILLED_SYRINGE | INTRAVENOUS | Status: DC | PRN
  Administered 2022-12-01 (×2): 160 ug via INTRAVENOUS
  Administered 2022-12-01: 80 ug via INTRAVENOUS

## 2022-12-01 MED ORDER — PHENYLEPHRINE HCL-NACL 20-0.9 MG/250ML-% IV SOLN
INTRAVENOUS | Status: DC | PRN
  Administered 2022-12-01: 40 ug/min via INTRAVENOUS

## 2022-12-01 MED ORDER — SODIUM CHLORIDE 0.9 % IV SOLN: 250.0000 mL | INTRAVENOUS | Status: DC | PRN

## 2022-12-01 MED ORDER — HEPARIN SODIUM (PORCINE) 1000 UNIT/ML IJ SOLN
INTRAMUSCULAR | Status: DC | PRN
  Administered 2022-12-01: 4000 [IU] via INTRAVENOUS
  Administered 2022-12-01: 16000 [IU] via INTRAVENOUS
  Administered 2022-12-01: 5000 [IU] via INTRAVENOUS

## 2022-12-01 MED ORDER — DEXAMETHASONE SODIUM PHOSPHATE 10 MG/ML IJ SOLN
INTRAMUSCULAR | Status: DC | PRN
  Administered 2022-12-01: 10 mg via INTRAVENOUS

## 2022-12-01 MED ORDER — ALBUMIN HUMAN 5 % IV SOLN
INTRAVENOUS | Status: DC | PRN
Start: 2022-12-01 — End: 2022-12-01

## 2022-12-01 MED ORDER — ACETAMINOPHEN 325 MG PO TABS
ORAL_TABLET | ORAL | Status: AC
  Filled 2022-12-01: qty 2

## 2022-12-01 MED ORDER — SODIUM CHLORIDE 0.9 % IV SOLN: INTRAVENOUS | Status: DC

## 2022-12-01 MED ORDER — ATROPINE SULFATE 1 MG/ML IV SOLN
INTRAVENOUS | Status: DC | PRN
Start: 2022-12-01 — End: 2022-12-01
  Administered 2022-12-01: 1 mg via INTRAVENOUS

## 2022-12-01 SURGICAL SUPPLY — 23 items
BAG SNAP BAND KOVER 36X36 (MISCELLANEOUS) IMPLANT
CABLE PFA RX CATH CONN (CABLE) IMPLANT
CATH FARAWAVE ABLATION 31 (CATHETERS) IMPLANT
CATH OCTARAY 2.0 F 3-3-3-3-3 (CATHETERS) IMPLANT
CATH SOUNDSTAR ECO 8FR (CATHETERS) IMPLANT
CATH WEBSTER BI DIR CS D-F CRV (CATHETERS) IMPLANT
CLOSURE MYNX CONTROL 6F/7F (Vascular Products) IMPLANT
CLOSURE PERCLOSE PROSTYLE (VASCULAR PRODUCTS) IMPLANT
COVER SWIFTLINK CONNECTOR (BAG) ×1 IMPLANT
DILATOR VESSEL 38 20CM 16FR (INTRODUCER) IMPLANT
GUIDEWIRE INQWIRE 1.5J.035X260 (WIRE) IMPLANT
INQWIRE 1.5J .035X260CM (WIRE) ×1
MAT PREVALON FULL STRYKER (MISCELLANEOUS) IMPLANT
PACK EP LATEX FREE (CUSTOM PROCEDURE TRAY) ×1
PACK EP LF (CUSTOM PROCEDURE TRAY) ×1 IMPLANT
PAD DEFIB RADIO PHYSIO CONN (PAD) ×1 IMPLANT
PATCH CARTO3 (PAD) IMPLANT
SHEATH FARADRIVE STEERABLE (SHEATH) IMPLANT
SHEATH PINNACLE 7F 10CM (SHEATH) IMPLANT
SHEATH PINNACLE 8F 10CM (SHEATH) IMPLANT
SHEATH PINNACLE 9F 10CM (SHEATH) IMPLANT
SHEATH PROBE COVER 6X72 (BAG) IMPLANT
SHEATH WIRE KIT BAYLIS SL1 (KITS) IMPLANT

## 2022-12-01 NOTE — Anesthesia Postprocedure Evaluation (Signed)
Anesthesia Post Note  Patient: Jesus Barnes  Procedure(s) Performed: ATRIAL FIBRILLATION ABLATION     Patient location during evaluation: Cath Lab Anesthesia Type: General Level of consciousness: awake and alert Pain management: pain level controlled Vital Signs Assessment: post-procedure vital signs reviewed and stable Respiratory status: spontaneous breathing, nonlabored ventilation and respiratory function stable Cardiovascular status: blood pressure returned to baseline and stable Postop Assessment: no apparent nausea or vomiting Anesthetic complications: no  There were no known notable events for this encounter.  Last Vitals:  Vitals:   12/01/22 1245 12/01/22 1300  BP: 117/72 (!) 112/92  Pulse: 77 75  Resp: (!) 22 13  Temp:    SpO2: 97% 96%    Last Pain:  Vitals:   12/01/22 1211  TempSrc: Temporal  PainSc:                  Huntley Demedeiros,W. EDMOND

## 2022-12-01 NOTE — Anesthesia Procedure Notes (Signed)
Procedure Name: Intubation Date/Time: 12/01/2022 7:40 AM  Performed by: Gaynelle Adu, MDPre-anesthesia Checklist: Patient identified, Emergency Drugs available, Suction available and Patient being monitored Patient Re-evaluated:Patient Re-evaluated prior to induction Oxygen Delivery Method: Circle system utilized Preoxygenation: Pre-oxygenation with 100% oxygen Induction Type: IV induction Ventilation: Mask ventilation without difficulty Laryngoscope Size: Glidescope Grade View: Grade I Tube type: Oral Tube size: 8.0 mm Number of attempts: 1 Airway Equipment and Method: Stylet and Oral airway Placement Confirmation: ETT inserted through vocal cords under direct vision, positive ETCO2 and breath sounds checked- equal and bilateral Secured at: 22 cm Tube secured with: Tape Dental Injury: Teeth and Oropharynx as per pre-operative assessment

## 2022-12-01 NOTE — Progress Notes (Signed)
Clarisse Gouge called and notified Aneshesia charge nurse that patient took farxiga yesterday morning .

## 2022-12-01 NOTE — Progress Notes (Signed)
Patient and wife was given discharge instructions. Both verbalized understanding.

## 2022-12-01 NOTE — H&P (Signed)
Electrophysiology Office Note   Date:  12/01/2022   ID:  Jesus Barnes, DOB 1970-08-16, MRN [id number]  PCP:  Eunice Blase, PA-C  Cardiologist:  Dulce Sellar Primary Electrophysiologist:  Avyanna Spada Jorja Loa, MD    Chief Complaint: AF   History of Present Illness: Jesus Barnes is a 52 y.o. male who is being seen today for the evaluation of AF at the request of No ref. provider found. Presenting today for electrophysiology evaluation.  He has a history significant for atrial fibrillation, hypertension, coronary artery disease, morbid obesity, sleep apnea.  He has had multiple cardioversions in the past.  He is previously been on dofetilide but went back into atrial fibrillation.  He had developed a tachycardia mediated cardiomyopathy.  Dofetilide was stopped and he went back into atrial fibrillation.  He was loaded on amiodarone but continued to have atrial fibrillation.  Today, denies symptoms of palpitations, chest pain, shortness of breath, orthopnea, PND, lower extremity edema, claudication, dizziness, presyncope, syncope, bleeding, or neurologic sequela. The patient is tolerating medications without difficulties. Plan ablation today.    Past Medical History:  Diagnosis Date   A-fib (HCC) 04/18/2022   Angina pectoris (HCC) 04/05/2016   Anxiety 09/02/2014   Back pain    Burn    Chest pain    Chronic anticoagulation 10/16/2016   Chronic fatigue syndrome    Depression    Dilated cardiomyopathy (HCC) 10/16/2016   EF 30-35%, 05/15/16 40%, 07/17/18 55-60%   Dysphagia    Esophageal stricture 09/02/2014   Essential hypertension 09/01/2014   GERD (gastroesophageal reflux disease)    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HCC)    High cholesterol    High risk medication use 09/01/2014   Overview:   Flecanide     Hypertension    Hypertension, essential 08/08/2018   Hypertensive heart disease 09/01/2014   Joint pain    Mild CAD 10/16/2016   Morbid obesity with body mass index (BMI) of 45.0 to 49.9 in  adult (HCC) 04/05/2016   Obstructive sleep apnea    awaiting treatment   On amiodarone therapy 10/16/2016   Palpitation    Paroxysmal atrial fibrillation (HCC) 12/12/2016   Persistent atrial fibrillation (HCC) 09/01/2014   Overview:  CHADS2=1   Sleep apnea    SOB (shortness of breath)    Past Surgical History:  Procedure Laterality Date   APPENDECTOMY     CARDIOVERSION     CARDIOVERSION N/A 12/14/2016   Procedure: CARDIOVERSION;  Surgeon: Marinus Maw, MD;  [facility]: [facility] INVASIVE CV LAB;  Service: Cardiovascular;  Laterality: N/A;   CARDIOVERSION N/A 08/15/2018   Procedure: CARDIOVERSION;  Surgeon: Thurmon Fair, MD;  Location: [facility] ENDOSCOPY;  Service: Cardiovascular;  Laterality: N/A;   CARDIOVERSION N/A 04/30/2020   Procedure: CARDIOVERSION;  Surgeon: Thurmon Fair, MD;  Location: [facility] ENDOSCOPY;  Service: Cardiovascular;  Laterality: N/A;   CARDIOVERSION N/A 06/02/2020   Procedure: CARDIOVERSION (CATH LAB);  Surgeon: Regan Lemming, MD;  Location: [facility] INVASIVE CV LAB;  Service: Cardiovascular;  Laterality: N/A;   CARDIOVERSION N/A 04/20/2022   Procedure: CARDIOVERSION;  Surgeon: Chrystie Nose, MD;  Location: [facility] ENDOSCOPY;  Service: Cardiovascular;  Laterality: N/A;   CARDIOVERSION N/A 05/23/2022   Procedure: CARDIOVERSION;  Surgeon: Wendall Stade, MD;  Location: [facility] ENDOSCOPY;  Service: Cardiovascular;  Laterality: N/A;   COLONOSCOPY  06/18/2001    Bx: Tubulovillous adenoma. Done by Dr. Charm Barges. He was told to come back 22yr later, but pt didnot.    ESOPHAGOGASTRODUODENOSCOPY  12/17/2014   mild  gastritis. Small antral polyps (likely inflammatory). Status post empiric esophageal dilatation   HERNIA REPAIR     umblical   KNEE SURGERY     SKIN GRAFT     URETHRA SURGERY       Current Facility-Administered Medications  Medication Dose Route Frequency Provider Last Rate Last Admin   0.9 %  sodium chloride infusion   Intravenous Continuous Regan Lemming, MD 50 mL/hr at  12/01/22 0554 New Bag at 12/01/22 0554    Allergies:   Patient has no known allergies.   Social History:  The patient  reports that he has quit smoking. His smoking use included cigars. He has never used smokeless tobacco. He reports that he does not currently use alcohol. He reports that he does not use drugs.   Family History:  The patient's family history includes Diabetes in his paternal grandfather; Heart disease in his father and paternal grandfather; Hypertension in his father and mother; Obesity in his mother.   ROS:  Please see the history of present illness.   Otherwise, review of systems is positive for none.   All other systems are reviewed and negative.   PHYSICAL EXAM: VS:  BP 101/60   Pulse 92   Temp 97.7 F (36.5 C) (Oral)   Resp 20   Ht 6' 2.5" (1.892 m)   Wt (!) 172.4 kg   SpO2 95%   BMI 48.14 kg/m  , BMI Body mass index is 48.14 kg/m. GEN: Well nourished, well developed, in no acute distress  HEENT: normal  Neck: no JVD, carotid bruits, or masses Cardiac: iRRR; no murmurs, rubs, or gallops,no edema  Respiratory:  clear to auscultation bilaterally, normal work of breathing GI: soft, nontender, nondistended, + BS MS: no deformity or atrophy  Skin: warm and dry Neuro:  Strength and sensation are intact Psych: euthymic mood, full affect   Recent Labs: 02/14/2022: ALT 29; NT-Pro BNP 670; TSH 3.320 05/18/2022: Magnesium 2.1 11/28/2022: BUN 16; Creatinine, Ser 0.82; Hemoglobin 16.7; Platelets 285; Potassium 4.6; Sodium 138    Lipid Panel     Component Value Date/Time   CHOL 165 05/27/2020 1209   TRIG 105 05/27/2020 1209   HDL 52 05/27/2020 1209   LDLCALC 94 05/27/2020 1209     Wt Readings from Last 3 Encounters:  12/01/22 (!) 172.4 kg  10/03/22 (!) 178.2 kg  07/24/22 (!) 170.2 kg      Other studies Reviewed: Additional studies/ records that were reviewed today include: TTE 02/28/22 Review of the above records today demonstrates:   1. Left  ventricular ejection fraction, by estimation, is 30 to 35%. The  left ventricle has severely decreased function. The left ventricle  demonstrates global hypokinesis. The left ventricular internal cavity size  was mildly dilated. There is moderate  concentric left ventricular hypertrophy. Left ventricular diastolic  parameters are indeterminate.   2. Right ventricular systolic function is normal. The right ventricular  size is not well visualized.   3. Left atrial size was moderately dilated.   4. The mitral valve is normal in structure. No evidence of mitral valve  regurgitation. No evidence of mitral stenosis.   5. The aortic valve is tricuspid. Aortic valve regurgitation is not  visualized. No aortic stenosis is present.   6. There is mild dilatation of the aortic root and of the ascending  aorta, measuring 39 mm.   7. The inferior vena cava is normal in size with greater than 50%  respiratory variability, suggesting right atrial pressure  of 3 mmHg.    ASSESSMENT AND PLAN:  1.  Longstanding persistent atrial fibrillation: Jesus Barnes has presented today for surgery, with the diagnosis of AF.  The various methods of treatment have been discussed with the patient and family. After consideration of risks, benefits and other options for treatment, the patient has consented to  Procedure(s): Catheter ablation as a surgical intervention .  Risks include but not limited to complete heart block, stroke, esophageal damage, nerve damage, bleeding, vascular damage, tamponade, perforation, MI, and death. The patient's history has been reviewed, patient examined, no change in status, stable for surgery.  I have reviewed the patient's chart and labs.  Questions were answered to the patient's satisfaction.    Jesus Marhefka Elberta Fortis, MD 12/01/2022 7:06 AM

## 2022-12-01 NOTE — Progress Notes (Signed)
BP low, head of bed down, mentation appropriate, Dr. Sampson Goon at bedside, 0.9NS bolus started at 999cc/hr for 250 cc

## 2022-12-01 NOTE — Discharge Instructions (Signed)

## 2022-12-01 NOTE — Transfer of Care (Signed)
Immediate Anesthesia Transfer of Care Note  Patient: Jesus Barnes  Procedure(s) Performed: ATRIAL FIBRILLATION ABLATION  Patient Location: PACU and Cath Lab  Anesthesia Type:General  Level of Consciousness: awake and oriented  Airway & Oxygen Therapy: Patient connected to face mask oxygen  Post-op Assessment: Report given to RN  Post vital signs: Reviewed and stable  Last Vitals:  Vitals Value Taken Time  BP    Temp    Pulse    Resp    SpO2      Last Pain:  Vitals:   12/01/22 0557  TempSrc: Oral  PainSc:       Patients Stated Pain Goal: 4 (12/01/22 0548)  Complications: There were no known notable events for this encounter.

## 2022-12-19 ENCOUNTER — Other Ambulatory Visit: Payer: Self-pay | Admitting: Cardiology

## 2022-12-19 DIAGNOSIS — I4819 Other persistent atrial fibrillation: Secondary | ICD-10-CM

## 2022-12-20 ENCOUNTER — Other Ambulatory Visit: Payer: Self-pay | Admitting: Cardiology

## 2022-12-20 ENCOUNTER — Other Ambulatory Visit: Payer: Self-pay | Admitting: Physician Assistant

## 2022-12-20 NOTE — Telephone Encounter (Signed)
Prescription refill request for Xarelto received.  Indication: Afib  Last office visit: 10/03/22 [facility])  Weight: 138.3kg Age: 52 Scr: 0.82 (11/28/22)  CrCl:  201.52ml/min  Appropriate dose. Refill sent.

## 2022-12-26 DIAGNOSIS — M25551 Pain in right hip: Secondary | ICD-10-CM | POA: Diagnosis not present

## 2022-12-28 ENCOUNTER — Ambulatory Visit (HOSPITAL_COMMUNITY)
Admission: RE | Admit: 2022-12-28 | Discharge: 2022-12-28 | Disposition: A | Discharge status: Home / Self Care | Payer: 59 | Source: Ambulatory Visit | Attending: Internal Medicine | Admitting: Internal Medicine

## 2022-12-28 VITALS — BP 106/66 | HR 59 | Ht 71.0 in | Wt >= 6400 oz

## 2022-12-28 DIAGNOSIS — Z91148 Patient's other noncompliance with medication regimen for other reason: Secondary | ICD-10-CM | POA: Diagnosis not present

## 2022-12-28 DIAGNOSIS — Z79899 Other long term (current) drug therapy: Secondary | ICD-10-CM | POA: Diagnosis present

## 2022-12-28 DIAGNOSIS — Z5181 Encounter for therapeutic drug level monitoring: Secondary | ICD-10-CM

## 2022-12-28 DIAGNOSIS — I10 Essential (primary) hypertension: Secondary | ICD-10-CM | POA: Insufficient documentation

## 2022-12-28 DIAGNOSIS — I4819 Other persistent atrial fibrillation: Secondary | ICD-10-CM | POA: Diagnosis present

## 2022-12-28 LAB — MAGNESIUM: Magnesium: 2 mg/dL (ref 1.7–2.4)

## 2022-12-28 NOTE — Progress Notes (Signed)
Primary Care Physician: Eunice Blase, PA-C Referring Physician: Dr. Elberta Fortis Cardiologist: Dr. Harrold Donath is a 52 y.o. male with a h/o dilated cardiomyopathy, HTN, persistent  afib in the past on Tikosyn but stopped due to being ineffective and noncompliance. He was then placed on amiodarone but cardioversion failed to convert pt and this was stopped March of 2022. It was thought that alcohol consumption was contributing to not being able to maintain SR as well.   He was seen by Dr. Elberta Fortis 04/03/22 and was found to have low EF and felt poorly. It was discussed with pt that he had developed tachycardia mediated cardiomyopathy and it was vital for him to be returned to SR. He discussed repeating dofetilide load with admission. PT states he is committed to stop using alcohol and be compliant with Tikosyn this go round.  His lopressor was stopped and he was placed on Toprol XL 100 mg bid.   He is now here for Tikosyn admit. No benadryl use. No missed anticoagulation. He is feeling lightheaded this am and BP is soft around 90 systolic. He thinks he has gotten behind on his water intake. He is pending a sleep study in the near future. He has afib with RVR today.   F/u in afib clinic, 04/27/21. He  is here for one week f/u of tikosyn admit. He checked his HR last night and it was noted to be 55 bpm. 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 He is being compliant with tikosyn.   F/u in afib clinic,05/04/22. He continues in afib, rate controlled. 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 We discussed today to pursue cardioversion and he wants to proceed. No missed anticoagulation and compliant with tikosyn. QT is reading prolonged in afib but inaccurate when out of rhythm.   On follow up 12/28/22, he is  currently in NSR. S/p Afib ablation on 12/01/22 by Dr. Elberta Fortis. Currently taking Tikosyn 500 mcg BID. No episodes of Afib since ablation. No chest pain, SOB, or trouble swallowing. Leg sites healed without issue. No missed doses of Xarelto 20 mg. He states he feels well in normal rhythm and is very appreciative. He has an upcoming appointment at [facility] to speak with someone regarding hybrid ablation.  Today, he denies symptoms of orthopnea, PND, lower extremity edema, dizziness, presyncope, syncope, snoring, daytime somnolence, bleeding, or neurologic sequela. The patient is tolerating medications without difficulties and is otherwise without complaint today.    Past Medical History:  Diagnosis Date   A-fib (HCC) 04/18/2022   Angina pectoris (HCC) 04/05/2016   Anxiety 09/02/2014   Back pain    Burn    Chest pain    Chronic anticoagulation 10/16/2016   Chronic fatigue syndrome    Depression    Dilated cardiomyopathy (HCC) 10/16/2016   EF 30-35%, 05/15/16 40%, 07/17/18 55-60%   Dysphagia    Esophageal stricture 09/02/2014   Essential hypertension 09/01/2014   GERD (gastroesophageal reflux disease)    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HCC)    High cholesterol    High risk medication use 09/01/2014   Overview:   Flecanide     Hypertension    Hypertension, essential 08/08/2018   Hypertensive heart disease 09/01/2014   Joint pain  Mild CAD 10/16/2016   Morbid obesity with body mass index (BMI) of 45.0 to 49.9 in adult [facility]) 04/05/2016   Obstructive sleep apnea    awaiting treatment   On amiodarone therapy 10/16/2016   Palpitation    Paroxysmal atrial fibrillation (HCC) 12/12/2016   Persistent atrial fibrillation (HCC) 09/01/2014   Overview:  CHADS2=1   Sleep apnea    SOB (shortness of breath)    Past Surgical History:  Procedure Laterality Date   APPENDECTOMY     ATRIAL FIBRILLATION ABLATION N/A 12/01/2022   Procedure: ATRIAL FIBRILLATION ABLATION;  Surgeon: Regan Lemming, MD;  [facility]: [facility]  INVASIVE CV LAB;  Service: Cardiovascular;  Laterality: N/A;   CARDIOVERSION     CARDIOVERSION N/A 12/14/2016   Procedure: CARDIOVERSION;  Surgeon: Marinus Maw, MD;  Location: [facility] INVASIVE CV LAB;  Service: Cardiovascular;  Laterality: N/A;   CARDIOVERSION N/A 08/15/2018   Procedure: CARDIOVERSION;  Surgeon: Thurmon Fair, MD;  Location: [facility] ENDOSCOPY;  Service: Cardiovascular;  Laterality: N/A;   CARDIOVERSION N/A 04/30/2020   Procedure: CARDIOVERSION;  Surgeon: Thurmon Fair, MD;  Location: [facility] ENDOSCOPY;  Service: Cardiovascular;  Laterality: N/A;   CARDIOVERSION N/A 06/02/2020   Procedure: CARDIOVERSION (CATH LAB);  Surgeon: Regan Lemming, MD;  Location: [facility] INVASIVE CV LAB;  Service: Cardiovascular;  Laterality: N/A;   CARDIOVERSION N/A 04/20/2022   Procedure: CARDIOVERSION;  Surgeon: Chrystie Nose, MD;  Location: [facility] ENDOSCOPY;  Service: Cardiovascular;  Laterality: N/A;   CARDIOVERSION N/A 05/23/2022   Procedure: CARDIOVERSION;  Surgeon: Wendall Stade, MD;  Location: [facility] ENDOSCOPY;  Service: Cardiovascular;  Laterality: N/A;   COLONOSCOPY  06/18/2001    Bx: Tubulovillous adenoma. Done by Dr. Charm Barges. He was told to come back 75yr later, but pt didnot.    ESOPHAGOGASTRODUODENOSCOPY  12/17/2014   mild gastritis. Small antral polyps (likely inflammatory). Status post empiric esophageal dilatation   HERNIA REPAIR     umblical   KNEE SURGERY     SKIN GRAFT     URETHRA SURGERY      Current Outpatient Medications  Medication Sig Dispense Refill   aspirin-acetaminophen-caffeine (EXCEDRIN MIGRAINE) 250-250-65 MG tablet Take 3 tablets by mouth daily as needed for headache.     dapagliflozin propanediol (FARXIGA) 10 MG TABS tablet Take 1 tablet (10 mg total) by mouth daily before breakfast. 90 tablet 2   diltiazem (CARDIZEM) 30 MG tablet Take 30 mg by mouth as needed. For HR greater than 100     dofetilide (TIKOSYN) 500 MCG capsule TAKE 1 CAPSULE BY MOUTH TWICE A DAY 180 capsule 0    ENTRESTO 49-51 MG Take 1 tablet by mouth 2 (two) times daily.     furosemide (LASIX) 40 MG tablet Take 40 mg by mouth daily.     metoprolol succinate (TOPROL-XL) 100 MG 24 hr tablet TAKE 1 TABLET (100 MG TOTAL) BY MOUTH 2 TIMES DAILY. TAKE WITH OR IMMEDIATELY FOLLOWING A MEAL. 180 tablet 3   nitroGLYCERIN (NITROSTAT) 0.4 MG SL tablet Place 0.4 mg under the tongue every 5 (five) minutes as needed for chest pain.     pantoprazole (PROTONIX) 40 MG tablet Take 40 mg by mouth daily.     PARoxetine (PAXIL) 40 MG tablet Take 40 mg by mouth daily.     potassium chloride SA (KLOR-CON M) 20 MEQ tablet Take 1 tablet (20 mEq total) by mouth daily. 30 tablet 9   rivaroxaban (XARELTO) 20 MG TABS tablet TAKE 1 TABLET BY MOUTH DAILY WITH SUPPER. 90 tablet  1   rosuvastatin (CRESTOR) 10 MG tablet Take 10 mg by mouth daily.     spironolactone (ALDACTONE) 25 MG tablet TAKE 1 TABLET (25 MG TOTAL) BY MOUTH DAILY. 90 tablet 1   temazepam (RESTORIL) 15 MG capsule Take 15 mg by mouth at bedtime as needed for sleep.     No current facility-administered medications for this encounter.    No Known Allergies  ROS- All systems are reviewed and negative except as per the HPI above  Physical Exam: Vitals:   12/28/22 1311  BP: 106/66  Pulse: (!) 59  Weight: (!) 183.3 kg  Height: 5\' 11"  (1.803 m)    Wt Readings from Last 3 Encounters:  12/28/22 (!) 183.3 kg  12/01/22 (!) 138.3 kg  10/03/22 (!) 178.2 kg    Labs: Lab Results  Component Value Date   NA 138 11/28/2022   K 4.6 11/28/2022   CL 99 11/28/2022   CO2 21 11/28/2022   GLUCOSE 151 (H) 11/28/2022   BUN 16 11/28/2022   CREATININE 0.82 11/28/2022   CALCIUM 8.9 11/28/2022   MG 2.1 05/18/2022   Lab Results  Component Value Date   INR 1.3 (H) 06/02/2020   Lab Results  Component Value Date   CHOL 165 05/27/2020   HDL 52 05/27/2020   LDLCALC 94 05/27/2020   TRIG 105 05/27/2020   GEN- The patient is well appearing, alert and oriented x 3 today.    Neck - no JVD or carotid bruit noted Lungs- Clear to ausculation bilaterally, normal work of breathing Heart- Regular rate and rhythm, no murmurs, rubs or gallops, PMI not laterally displaced Extremities- no clubbing, cyanosis, or edema Skin - no rash or ecchymosis noted   EKG-  Vent. rate 59 BPM PR interval 170 ms QRS duration 96 ms QT/QTcB 448/443 ms P-R-T axes 41 4 15 Sinus bradycardia Otherwise normal ECG When compared with ECG of 01-Dec-2022 10:20, PREVIOUS ECG IS PRESENT  Echo 07/18/22:  1. Left ventricular ejection fraction, by estimation, is 45 to 50%. The  left ventricle has mildly decreased function. Left ventricular endocardial  border not optimally defined to evaluate regional wall motion. There is  mild left ventricular hypertrophy.   Left ventricular diastolic parameters are indeterminate.   2. Right ventricular systolic function is normal. The right ventricular  size is normal.   3. The mitral valve is normal in structure. No evidence of mitral valve  regurgitation. No evidence of mitral stenosis.   4. The aortic valve is normal in structure. Aortic valve regurgitation is  not visualized. No aortic stenosis is present.   5. The inferior vena cava is normal in size with greater than 50%  respiratory variability, suggesting right atrial pressure of 3 mmHg.   Assessment and Plan:   1. Persistent afib  Pt failed Tikosyn in the past for noncompliance and amiodarone was stopped in 05/2020 for being refractory to afib. Multiple cardioversion's over the years.  He has stopped alcohol use.    S/p Afib ablation on 12/01/22 by Dr. Elberta Fortis.   He is currently in NSR.  Qtc stable. Continue Tikosyn 500 mg BID.   Follow up as scheduled with Dr. Elberta Fortis.  Justin Mend, PA-C Afib Clinic [facility] [address] [place], [place] [address] [phone]

## 2023-01-10 DIAGNOSIS — I4811 Longstanding persistent atrial fibrillation: Secondary | ICD-10-CM | POA: Diagnosis not present

## 2023-01-10 DIAGNOSIS — I4819 Other persistent atrial fibrillation: Secondary | ICD-10-CM | POA: Diagnosis not present

## 2023-01-10 DIAGNOSIS — I420 Dilated cardiomyopathy: Secondary | ICD-10-CM | POA: Diagnosis not present

## 2023-01-31 ENCOUNTER — Telehealth: Payer: Self-pay | Admitting: Cardiology

## 2023-01-31 NOTE — Telephone Encounter (Signed)
Received long term disability form from [employer].  Spoke with patient who said that he would come in this week to pay and sign. Form at front desk.

## 2023-02-14 NOTE — Telephone Encounter (Signed)
Spoke to pt today in office and aware this disability needs to  go through Dr. Mathis Bud.  Patient has picked up paperwork and taking it to his office.

## 2023-02-23 ENCOUNTER — Other Ambulatory Visit: Payer: Self-pay | Admitting: Cardiology

## 2023-03-22 ENCOUNTER — Ambulatory Visit: Payer: 59 | Admitting: Physician Assistant

## 2023-03-31 ENCOUNTER — Other Ambulatory Visit: Payer: Self-pay | Admitting: Cardiology

## 2023-04-01 NOTE — Progress Notes (Unsigned)
  Electrophysiology Office Note:   Date:  04/02/2023  ID:  Jesus Barnes, DOB 1970/06/02, MRN [id number]  Primary Cardiologist: Norman Herrlich, MD Primary Heart Failure: None Electrophysiologist: Izora Benn Jorja Loa, MD      History of Present Illness:   Jesus Barnes is a 53 y.o. male with h/o atrial fibrillation, hypertension, coronary artery disease, morbid obesity, sleep apnea seen today for routine electrophysiology followup.   Since last being seen in our clinic the patient reports doing quite a bit better than prior to his ablation.  He continues to have episodes of atrial fibrillation that last up to 48 hours, but he is in sinus rhythm most of the time.  He continues to have dyspnea, but overall feels much improved.  He does continue to have some mild shortness of breath, but otherwise feels quite well.  he denies chest pain, palpitations, PND, orthopnea, nausea, vomiting, dizziness, syncope, edema, weight gain, or early satiety.   Review of systems complete and found to be negative unless listed in HPI.   EP Information / Studies Reviewed:    EKG is ordered today. Personal review as below. Sinus rhythm       Risk Assessment/Calculations:    CHA2DS2-VASc Score = 2   This indicates a 2.2% annual risk of stroke. The patient's score is based upon: CHF History: 1 HTN History: 1 Diabetes History: 0 Stroke History: 0 Vascular Disease History: 0 Age Score: 0 Gender Score: 0            Physical Exam:   VS:  BP 126/84   Pulse (!) 52   Ht 6' 2.5" (1.892 m)   Wt (!) 411 lb (186.4 kg)   SpO2 93%   BMI 52.06 kg/m    Wt Readings from Last 3 Encounters:  04/02/23 (!) 411 lb (186.4 kg)  12/28/22 (!) 404 lb 3.2 oz (183.3 kg)  12/01/22 (!) 305 lb (138.3 kg)     GEN: Well nourished, well developed in no acute distress NECK: No JVD; No carotid bruits CARDIAC: Regular rate and rhythm, no murmurs, rubs, gallops RESPIRATORY:  Clear to auscultation without rales, wheezing or  rhonchi  ABDOMEN: Soft, non-tender, non-distended EXTREMITIES:  No edema; No deformity   ASSESSMENT AND PLAN:    1.  Longstanding persistent atrial fibrillation: Post ablation 12/01/2022.  He continues to have short episodes of atrial fibrillation, but is in sinus rhythm for the majority of the time.  On dofetilide.  No changes.  2.  Morbid obesity: Lifestyle modification encouraged  3.  Hypertension: Currently well-controlled  4.  Chronic systolic heart failure: Ejection fraction 30 to 35%.  Talor Cheema plan for repeat echo.  Medical therapy per primary cardiology  5.  Secondary hypercoagulable state: Currently on Xarelto for atrial fibrillation  6.  High risk medication monitoring: Currently on dofetilide.  QT acceptable.  Follow up with Dr. Elberta Fortis in 6 months  Signed, Jesus Barnes Jorja Loa, MD

## 2023-04-02 ENCOUNTER — Encounter: Payer: Self-pay | Admitting: Cardiology

## 2023-04-02 ENCOUNTER — Ambulatory Visit: Payer: BC Managed Care – PPO | Attending: Cardiology | Admitting: Cardiology

## 2023-04-02 VITALS — BP 126/84 | HR 52 | Ht 74.5 in | Wt >= 6400 oz

## 2023-04-02 DIAGNOSIS — I209 Angina pectoris, unspecified: Secondary | ICD-10-CM | POA: Diagnosis not present

## 2023-04-02 DIAGNOSIS — D6869 Other thrombophilia: Secondary | ICD-10-CM

## 2023-04-02 DIAGNOSIS — I10 Essential (primary) hypertension: Secondary | ICD-10-CM

## 2023-04-02 DIAGNOSIS — I5022 Chronic systolic (congestive) heart failure: Secondary | ICD-10-CM | POA: Diagnosis not present

## 2023-04-02 DIAGNOSIS — Z79899 Other long term (current) drug therapy: Secondary | ICD-10-CM

## 2023-04-02 DIAGNOSIS — I4819 Other persistent atrial fibrillation: Secondary | ICD-10-CM

## 2023-04-02 NOTE — Patient Instructions (Signed)
Medication Instructions:  Your physician recommends that you continue on your current medications as directed. Please refer to the Current Medication list given to you today.  *If you need a refill on your cardiac medications before your next appointment, please call your pharmacy*   Lab Work: None ordered If you have labs (blood work) drawn today and your tests are completely normal, you will receive your results only by: MyChart Message (if you have MyChart) OR A paper copy in the mail If you have any lab test that is abnormal or we need to change your treatment, we will call you to review the results.   Testing/Procedures: Your physician has requested that you have an echocardiogram. Echocardiography is a painless test that uses sound waves to create images of your heart. It provides your doctor with information about the size and shape of your heart and how well your heart's chambers and valves are working. This procedure takes approximately one hour. There are no restrictions for this procedure. Please do NOT wear cologne, perfume, aftershave, or lotions (deodorant is allowed). Please arrive 15 minutes prior to your appointment time.  Please note: We ask at that you not bring children with you during ultrasound (echo/ vascular) testing. Due to room size and safety concerns, children are not allowed in the ultrasound rooms during exams. Our front office staff cannot provide observation of children in our lobby area while testing is being conducted. An adult accompanying a patient to their appointment will only be allowed in the ultrasound room at the discretion of the ultrasound technician under special circumstances. We apologize for any inconvenience.    Follow-Up: At [facility], you and your health needs are our priority.  As part of our continuing mission to provide you with exceptional heart care, we have created designated Provider Care Teams.  These Care Teams include your  primary Cardiologist (physician) and Advanced Practice Providers (APPs -  Physician Assistants and Nurse Practitioners) who all work together to provide you with the care you need, when you need it.  Your next appointment:   6 month(s)  The format for your next appointment:   In Person  Provider:   Loman Brooklyn, MD    Thank you for choosing [facility]!!   Dory Horn, RN [phone]

## 2023-04-04 ENCOUNTER — Encounter: Payer: Self-pay | Admitting: Cardiology

## 2023-04-04 NOTE — Progress Notes (Deleted)
Cardiology Office Note:    Date:  04/04/2023   ID:  Jesus Barnes, DOB 11-07-1970, MRN [id number]  PCP:  Eunice Blase, PA-C  Cardiologist:  Norman Herrlich, MD    Referring MD: Eunice Blase, PA-C    ASSESSMENT:    No diagnosis found. PLAN:    In order of problems listed above:  ***   Next appointment: ***   Medication Adjustments/Labs and Tests Ordered: Current medicines are reviewed at length with the patient today.  Concerns regarding medicines are outlined above.  No orders of the defined types were placed in this encounter.  No orders of the defined types were placed in this encounter.    History of Present Illness:    Jesus Barnes is a 52 y.o. male with a hx of persistent atrial fibrillation with chronic anticoagulation and arrhythmia associated cardiomyopathy most recent ejection fraction 45 to 50%.  He wants last seen 10/03/2022.  At his request he was seen at [facility] 01/10/2019 for an second opinion.  NSTEMI and maintaining sinus rhythm after pulsed field ablation and on Tikosyn.  If he recurs to be considered for a hybrid ablation. Compliance with diet, lifestyle and medications: *** Past Medical History:  Diagnosis Date   A-fib (HCC) 04/18/2022   Angina pectoris (HCC) 04/05/2016   Anxiety 09/02/2014   Back pain    Burn    Chest pain    Chronic anticoagulation 10/16/2016   Chronic fatigue syndrome    Depression    Dilated cardiomyopathy (HCC) 10/16/2016   EF 30-35%, 05/15/16 40%, 07/17/18 55-60%   Dysphagia    Encounter for monitoring dofetilide therapy 10/16/2016   Esophageal stricture 09/02/2014   Essential hypertension 09/01/2014   GERD (gastroesophageal reflux disease)    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HCC)    High cholesterol    High risk medication use 09/01/2014   Overview:   Flecanide     Hypertension    Hypertension, essential 08/08/2018   Hypertensive heart disease 09/01/2014   Joint pain    Mild CAD 10/16/2016   Morbid obesity with body mass index  (BMI) of 45.0 to 49.9 in adult (HCC) 04/05/2016   Obstructive sleep apnea    awaiting treatment   On amiodarone therapy 10/16/2016   Palpitation    Paroxysmal atrial fibrillation (HCC) 12/12/2016   Persistent atrial fibrillation (HCC) 09/01/2014   Overview:  CHADS2=1   Sleep apnea    SOB (shortness of breath)     Current Medications: Current Meds  Medication Sig   lisinopril (ZESTRIL) 20 MG tablet Take 20 mg by mouth daily.   metoprolol tartrate (LOPRESSOR) 50 MG tablet Take 50 mg by mouth daily.      EKGs/Labs/Other Studies Reviewed:    The following studies were reviewed today:  Cardiac Studies & Procedures      ECHOCARDIOGRAM  ECHOCARDIOGRAM COMPLETE 07/18/2022  Narrative ECHOCARDIOGRAM REPORT    Patient Name:   Jesus Barnes Date of Exam: 07/18/2022 Medical Rec #:  [id number]       Height:       74.5 in Accession #:    [id number]      Weight:       363.0 lb Date of Birth:  1970-09-15        BSA:          2.813 m Patient Age:    52 years        BP:           84/55 mmHg Patient Gender: M  HR:           92 bpm. Exam Location:  [place]  Procedure: 2D Echo, Cardiac Doppler, Color Doppler, Strain Analysis and Intracardiac Opacification Agent  Indications:    Paroxysmal atrial fibrillation (HCC) [I48.0 (ICD-10-CM)]; Chronic anticoagulation [Z79.01 (ICD-10-CM)]; High risk medication use [Z79.899 (ICD-10-CM)]; Dilated cardiomyopathy (HCC) [I42.0 (ICD-10-CM)]; Hypotension due to drugs [I95.2 (ICD-10-CM)]  History:        Patient has prior history of Echocardiogram examinations, most recent 02/28/2022. Cardiomyopathy, Arrythmias:Atrial Fibrillation, Signs/Symptoms:Hypotension; Risk Factors:Former Smoker.  Sonographer:    Margreta Journey RDCS Referring Phys: [id number] [facility]   Sonographer Comments: Technically difficult study due to poor echo windows. Image acquisition challenging due to patient body habitus. IMPRESSIONS   1. Left ventricular  ejection fraction, by estimation, is 45 to 50%. The left ventricle has mildly decreased function. Left ventricular endocardial border not optimally defined to evaluate regional wall motion. There is mild left ventricular hypertrophy. Left ventricular diastolic parameters are indeterminate. 2. Right ventricular systolic function is normal. The right ventricular size is normal. 3. The mitral valve is normal in structure. No evidence of mitral valve regurgitation. No evidence of mitral stenosis. 4. The aortic valve is normal in structure. Aortic valve regurgitation is not visualized. No aortic stenosis is present. 5. The inferior vena cava is normal in size with greater than 50% respiratory variability, suggesting right atrial pressure of 3 mmHg.  FINDINGS Left Ventricle: Left ventricular ejection fraction, by estimation, is 45 to 50%. The left ventricle has mildly decreased function. Left ventricular endocardial border not optimally defined to evaluate regional wall motion. Definity contrast agent was given IV to delineate the left ventricular endocardial borders. The left ventricular internal cavity size was normal in size. There is mild left ventricular hypertrophy. Left ventricular diastolic parameters are indeterminate.  Right Ventricle: The right ventricular size is normal. No increase in right ventricular wall thickness. Right ventricular systolic function is normal.  Left Atrium: Left atrial size was normal in size.  Right Atrium: Right atrial size was normal in size.  Pericardium: There is no evidence of pericardial effusion.  Mitral Valve: The mitral valve is normal in structure. No evidence of mitral valve regurgitation. No evidence of mitral valve stenosis.  Tricuspid Valve: The tricuspid valve is normal in structure. Tricuspid valve regurgitation is not demonstrated. No evidence of tricuspid stenosis.  Aortic Valve: The aortic valve is normal in structure. Aortic valve regurgitation  is not visualized. No aortic stenosis is present.  Pulmonic Valve: The pulmonic valve was normal in structure. Pulmonic valve regurgitation is not visualized. No evidence of pulmonic stenosis.  Aorta: The aortic root is normal in size and structure.  Venous: The inferior vena cava is normal in size with greater than 50% respiratory variability, suggesting right atrial pressure of 3 mmHg.  IAS/Shunts: No atrial level shunt detected by color flow Doppler.   LEFT VENTRICLE PLAX 2D LVIDd:         5.00 cm   Diastology LVIDs:         4.00 cm   LV e' medial:    16.00 cm/s LV PW:         1.20 cm   LV E/e' medial:  4.6 LV IVS:        1.20 cm   LV e' lateral:   16.60 cm/s LVOT diam:     2.50 cm   LV E/e' lateral: 4.5 LV SV:         64 LV SV Index:  23 LVOT Area:     4.91 cm   RIGHT VENTRICLE RV Basal diam:  4.40 cm RV Mid diam:    4.50 cm RV S prime:     9.95 cm/s TAPSE (M-mode): 1.7 cm  LEFT ATRIUM             Index        RIGHT ATRIUM           Index LA diam:        4.90 cm 1.74 cm/m   RA Area:     23.10 cm LA Vol (A2C):   79.1 ml 28.12 ml/m  RA Volume:   67.70 ml  24.07 ml/m LA Vol (A4C):   83.8 ml 29.79 ml/m LA Biplane Vol: 84.8 ml 30.15 ml/m AORTIC VALVE LVOT Vmax:   73.40 cm/s LVOT Vmean:  55.900 cm/s LVOT VTI:    0.130 m  AORTA Ao Root diam: 4.40 cm Ao Asc diam:  3.80 cm Ao Desc diam: 3.20 cm  MITRAL VALVE MV Area (PHT): 4.15 cm    SHUNTS MV Decel Time: 183 msec    Systemic VTI:  0.13 m MV E velocity: 74.40 cm/s  Systemic Diam: 2.50 cm  Gypsy Balsam MD Electronically signed by Gypsy Balsam MD Signature Date/Time: 07/18/2022/5:33:45 PM    Final                 Recent Labs: 11/28/2022: BUN 16; Creatinine, Ser 0.82; Hemoglobin 16.7; Platelets 285; Potassium 4.6; Sodium 138 12/28/2022: Magnesium 2.0  Recent Lipid Panel    Component Value Date/Time   CHOL 165 05/27/2020 1209   TRIG 105 05/27/2020 1209   HDL 52 05/27/2020 1209   LDLCALC 94  05/27/2020 1209    Physical Exam:    VS:  There were no vitals taken for this visit.    Wt Readings from Last 3 Encounters:  04/02/23 (!) 411 lb (186.4 kg)  12/28/22 (!) 404 lb 3.2 oz (183.3 kg)  12/01/22 (!) 305 lb (138.3 kg)     GEN: *** Well nourished, well developed in no acute distress HEENT: Normal NECK: No JVD; No carotid bruits LYMPHATICS: No lymphadenopathy CARDIAC: ***RRR, no murmurs, rubs, gallops RESPIRATORY:  Clear to auscultation without rales, wheezing or rhonchi  ABDOMEN: Soft, non-tender, non-distended MUSCULOSKELETAL:  No edema; No deformity  SKIN: Warm and dry NEUROLOGIC:  Alert and oriented x 3 PSYCHIATRIC:  Normal affect    Signed, Norman Herrlich, MD  04/04/2023 7:26 PM    [facility]

## 2023-04-05 ENCOUNTER — Ambulatory Visit: Payer: BC Managed Care – PPO | Admitting: Cardiology

## 2023-04-18 ENCOUNTER — Ambulatory Visit: Payer: BC Managed Care – PPO | Attending: Cardiology

## 2023-04-18 DIAGNOSIS — I4819 Other persistent atrial fibrillation: Secondary | ICD-10-CM | POA: Diagnosis not present

## 2023-04-18 DIAGNOSIS — I209 Angina pectoris, unspecified: Secondary | ICD-10-CM | POA: Diagnosis not present

## 2023-04-18 LAB — ECHOCARDIOGRAM COMPLETE
Area-P 1/2: 3.48 cm2
S' Lateral: 4.7 cm

## 2023-04-18 MED ORDER — PERFLUTREN LIPID MICROSPHERE
1.0000 mL | INTRAVENOUS | Status: AC | PRN
Start: 2023-04-18 — End: 2023-04-18
  Administered 2023-04-18: 10 mL via INTRAVENOUS

## 2023-04-23 DIAGNOSIS — Z79899 Other long term (current) drug therapy: Secondary | ICD-10-CM | POA: Diagnosis not present

## 2023-04-23 DIAGNOSIS — F410 Panic disorder [episodic paroxysmal anxiety] without agoraphobia: Secondary | ICD-10-CM | POA: Diagnosis not present

## 2023-04-23 DIAGNOSIS — E782 Mixed hyperlipidemia: Secondary | ICD-10-CM | POA: Diagnosis not present

## 2023-04-23 DIAGNOSIS — F411 Generalized anxiety disorder: Secondary | ICD-10-CM | POA: Diagnosis not present

## 2023-04-23 DIAGNOSIS — Z125 Encounter for screening for malignant neoplasm of prostate: Secondary | ICD-10-CM | POA: Diagnosis not present

## 2023-04-23 DIAGNOSIS — I4891 Unspecified atrial fibrillation: Secondary | ICD-10-CM | POA: Diagnosis not present

## 2023-04-24 NOTE — Progress Notes (Unsigned)
Cardiology Office Note:    Date:  04/25/2023   ID:  Jesus Barnes, DOB 17-Aug-1970, MRN [id number]  PCP:  Eunice Blase, PA-C  Cardiologist:  Norman Herrlich, MD    Referring MD: Eunice Blase, PA-C    ASSESSMENT:    1. Paroxysmal atrial fibrillation (HCC)   2. High risk medication use   3. Chronic anticoagulation   4. Dilated cardiomyopathy (HCC)   5. Hypertensive heart disease with chronic combined systolic and diastolic congestive heart failure (HCC)    PLAN:    In order of problems listed above:  The good news is he is maintaining sinus rhythm after his last PFA EP catheter ablation and on Tikosyn continue Tikosyn and his anticoagulant LV function is improved unfortunately financially limited and we can utilize [place] or [place] His heart failure is well compensated blood pressure is at target continue his loop diuretic and MRA. I encouraged him to pursue Medicaid he may be eligible so that he can get access to [place]   Next appointment: Plan to see him back in 6 months   Medication Adjustments/Labs and Tests Ordered: Current medicines are reviewed at length with the patient today.  Concerns regarding medicines are outlined above.  Orders Placed This Encounter  Procedures   EKG 12-Lead   No orders of the defined types were placed in this encounter.    History of Present Illness:    Jesus Barnes is a 53 y.o. male with a hx of persistent atrial fibrillation with chronic anticoagulation and arrhythmia associated cardiomyopathy most recent ejection fraction 45 to 50%.  He was last seen 10/03/2022.  At his request he was seen at [facility] 01/10/2023 for an second opinion.  NSTEMI and maintaining sinus rhythm after pulsed field ablation and on Tikosyn.  If he recurs to be considered for a hybrid ablation.  Most recently underwent PFA for atrial fibrillation in September.  If recurrence he will consider hybrid procedure at [facility] he has been seen in  consultation.  Most recent ejection fraction 04/18/2023 40-45% mild diffuse hypokinesia significant improvement from previous ventricular size and function.  Previously was on Entresto when last seen by me.  Compliance with diet, lifestyle and medications: Yes  He is best described as frustrated he is now receiving Social Security disability and sadly his mother recently died He has brief palpitation not severe or sustained he is compliant with his Tikosyn anticoagulant he has great financial limitation he can no longer afford for CHF for Entresto he stopped these on his own He is feeling better not having shortness of breath edema chest pain or syncope Recent labs with his primary care physician 04/23/2023 shows a cholesterol 168 LDL 91 non-HDL cholesterol 77 A1c 5.4 hemoglobin 16.7 creatinine 0.71 potassium 4.4 On fortunately lifestyle issues are present he has not regained the weight that he lost he is an active because of her orthopedic trouble with hips and knees and he continues to use alcohol. Past Medical History:  Diagnosis Date   A-fib (HCC) 04/18/2022   Angina pectoris (HCC) 04/05/2016   Anxiety 09/02/2014   Back pain    Burn    Chest pain    Chronic anticoagulation 10/16/2016   Chronic fatigue syndrome    Depression    Dilated cardiomyopathy (HCC) 10/16/2016   EF 30-35%, 05/15/16 40%, 07/17/18 55-60%   Dysphagia    Encounter for monitoring dofetilide therapy 10/16/2016   Esophageal stricture 09/02/2014   Essential hypertension 09/01/2014   GERD (gastroesophageal reflux  disease)    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HCC)    High cholesterol    High risk medication use 09/01/2014   Overview:   Flecanide     Hypertension    Hypertension, essential 08/08/2018   Hypertensive heart disease 09/01/2014   Joint pain    Mild CAD 10/16/2016   Morbid obesity with body mass index (BMI) of 45.0 to 49.9 in adult [facility]) 04/05/2016   Obstructive sleep apnea    awaiting treatment   On amiodarone therapy  10/16/2016   Palpitation    Paroxysmal atrial fibrillation (HCC) 12/12/2016   Persistent atrial fibrillation (HCC) 09/01/2014   Overview:  CHADS2=1   Sleep apnea    SOB (shortness of breath)     Current Medications: Current Meds  Medication Sig   aspirin-acetaminophen-caffeine (EXCEDRIN MIGRAINE) 250-250-65 MG tablet Take 3 tablets by mouth daily as needed for headache.   atorvastatin (LIPITOR) 10 MG tablet Take 10 mg by mouth daily.   diltiazem (CARDIZEM) 30 MG tablet Take 30 mg by mouth as needed. For HR greater than 100   dofetilide (TIKOSYN) 500 MCG capsule TAKE 1 CAPSULE BY MOUTH TWICE A DAY   furosemide (LASIX) 40 MG tablet Take 1 tablet (40 mg total) by mouth daily.   lisinopril (ZESTRIL) 20 MG tablet Take 20 mg by mouth daily.   metoprolol succinate (TOPROL-XL) 100 MG 24 hr tablet TAKE 1 TABLET (100 MG TOTAL) BY MOUTH 2 TIMES DAILY. TAKE WITH OR IMMEDIATELY FOLLOWING A MEAL.   metoprolol tartrate (LOPRESSOR) 50 MG tablet Take 50 mg by mouth daily.   nitroGLYCERIN (NITROSTAT) 0.4 MG SL tablet Place 0.4 mg under the tongue every 5 (five) minutes as needed for chest pain.   pantoprazole (PROTONIX) 40 MG tablet Take 40 mg by mouth daily.   PARoxetine (PAXIL) 40 MG tablet Take 40 mg by mouth daily.   potassium chloride SA (KLOR-CON M) 20 MEQ tablet Take 1 tablet (20 mEq total) by mouth daily.   rivaroxaban (XARELTO) 20 MG TABS tablet TAKE 1 TABLET BY MOUTH DAILY WITH SUPPER.   rosuvastatin (CRESTOR) 10 MG tablet Take 10 mg by mouth daily.   spironolactone (ALDACTONE) 25 MG tablet TAKE 1 TABLET (25 MG TOTAL) BY MOUTH DAILY.   temazepam (RESTORIL) 15 MG capsule Take 15 mg by mouth at bedtime as needed for sleep.   testosterone cypionate (DEPOTESTOTERONE CYPIONATE) 100 MG/ML injection Inject 100 mg into the muscle.      EKGs/Labs/Other Studies Reviewed:    The following studies were reviewed today:  Cardiac Studies & Procedures    ______________________________________________________________________________________________     ECHOCARDIOGRAM  ECHOCARDIOGRAM COMPLETE 04/18/2023  Narrative ECHOCARDIOGRAM REPORT    Patient Name:   Jesus Barnes Date of Exam: 04/18/2023 Medical Rec #:  [id number]       Height:       74.5 in Accession #:    [id number]      Weight:       411.0 lb Date of Birth:  12-04-1970        BSA:          2.966 m Patient Age:    52 years        BP:           126/84 mmHg Patient Gender: M               HR:           117 bpm. Exam Location:  [place]  Procedure: 2D Echo, Cardiac Doppler, Color Doppler  and Intracardiac Opacification Agent  Indications:    Persistent atrial fibrillation (HCC) [I48.19 (ICD-10-CM)]; Angina pectoris (HCC) [I20.9 (ICD-10-CM)]  History:        Patient has prior history of Echocardiogram examinations, most recent 07/18/2022. CHF and Cardiomyopathy, CAD, Arrythmias:Atrial Fibrillation, Signs/Symptoms:Chest Pain and Shortness of Breath; Risk Factors:Hypertension and Dyslipidemia.  Sonographer:    Margreta Journey RDCS Referring Phys: [id number] WILL MARTIN CAMNITZ   Sonographer Comments: Image acquisition challenging due to patient body habitus. IMPRESSIONS   1. Left ventricular ejection fraction, by estimation, is 40 to 45%. The left ventricle has mildly decreased function. The left ventricle demonstrates global hypokinesis. Left ventricular diastolic parameters are indeterminate. 2. Right ventricular systolic function is normal. The right ventricular size is mildly enlarged. 3. Right atrial size was mildly dilated. 4. The mitral valve was not well visualized. No evidence of mitral valve regurgitation. No evidence of mitral stenosis. 5. The aortic valve is tricuspid. Aortic valve regurgitation is not visualized. No aortic stenosis is present.  Comparison(s): A prior study was performed on 07/18/2022. LVEF was reported 45-50%.  Conclusion(s)/Recommendation(s):  Extremely limited visualization despite Definity contrast use; Consider alternate diagnostic modalities if clinically indicated.  FINDINGS Left Ventricle: Left ventricular ejection fraction, by estimation, is 40 to 45%. The left ventricle has mildly decreased function. The left ventricle demonstrates global hypokinesis. Definity contrast agent was given IV to delineate the left ventricular endocardial borders. The left ventricular internal cavity size was normal in size. Suboptimal image quality limits for assessment of left ventricular hypertrophy. Left ventricular diastolic parameters are indeterminate.  Right Ventricle: The right ventricular size is mildly enlarged. Right vetricular wall thickness was not well visualized. Right ventricular systolic function is normal.  Left Atrium: Left atrial size was not well visualized.  Right Atrium: Right atrial size was mildly dilated.  Pericardium: There is no evidence of pericardial effusion.  Mitral Valve: The mitral valve was not well visualized. No evidence of mitral valve regurgitation. No evidence of mitral valve stenosis.  Tricuspid Valve: The tricuspid valve is not well visualized.  Aortic Valve: The aortic valve is tricuspid. Aortic valve regurgitation is not visualized. No aortic stenosis is present.  Pulmonic Valve: The pulmonic valve was not well visualized.  Aorta: The aortic root measuring 4.3cm and ascending aorta diameter 3.7cm are structurally normal, with no evidence of dilitation when indexed to body surface area.  Venous: The inferior vena cava was not well visualized.  IAS/Shunts: No atrial level shunt detected by color flow Doppler.   LEFT VENTRICLE PLAX 2D LVIDd:         5.30 cm   Diastology LVIDs:         4.70 cm   LV e' medial:    13.60 cm/s LV PW:         1.50 cm   LV E/e' medial:  5.7 LV IVS:        1.50 cm   LV e' lateral:   12.10 cm/s LVOT diam:     2.50 cm   LV E/e' lateral: 6.5 LV SV:         58 LV SV  Index:   20 LVOT Area:     4.91 cm   RIGHT VENTRICLE             IVC RV Basal diam:  5.00 cm     IVC diam: 2.70 cm RV Mid diam:    4.40 cm RV S prime:     12.40 cm/s TAPSE (M-mode): 2.8 cm  LEFT ATRIUM           Index        RIGHT ATRIUM           Index LA diam:      6.00 cm 2.02 cm/m   RA Area:     19.80 cm LA Vol (A4C): 71.1 ml 23.98 ml/m  RA Volume:   58.90 ml  19.86 ml/m AORTIC VALVE LVOT Vmax:   70.40 cm/s LVOT Vmean:  46.433 cm/s LVOT VTI:    0.118 m  AORTA Ao Root diam: 4.30 cm Ao Asc diam:  3.70 cm  MITRAL VALVE MV Area (PHT): 3.48 cm    SHUNTS MV Decel Time: 218 msec    Systemic VTI:  0.12 m MV E velocity: 78.10 cm/s  Systemic Diam: 2.50 cm  Sreedhar reddy Madireddy Electronically signed by Vern Claude reddy Madireddy Signature Date/Time: 04/18/2023/7:11:38 PM    Final          ______________________________________________________________________________________________      EKG Interpretation Date/Time:  Wednesday April 25 2023 13:31:30 EST Ventricular Rate:  60 PR Interval:  164 QRS Duration:  96 QT Interval:  464 QTC Calculation: 464 R Axis:   -2  Text Interpretation: Normal sinus rhythm V1 V3 reversal otherwise normal When compared with ECG of 02-Apr-2023 09:31, No significant change was found Confirmed by Norman Herrlich ([id number]) on 04/25/2023 1:33:52 PM   Recent Labs: 11/28/2022: BUN 16; Creatinine, Ser 0.82; Hemoglobin 16.7; Platelets 285; Potassium 4.6; Sodium 138 12/28/2022: Magnesium 2.0  Recent Lipid Panel    Component Value Date/Time   CHOL 165 05/27/2020 1209   TRIG 105 05/27/2020 1209   HDL 52 05/27/2020 1209   LDLCALC 94 05/27/2020 1209    Physical Exam:    VS:  BP (!) 120/90   Pulse 60   Ht 6\' 2"  (1.88 m)   Wt (!) 421 lb 12.8 oz (191.3 kg)   SpO2 96%   BMI 54.16 kg/m     Wt Readings from Last 3 Encounters:  04/25/23 (!) 421 lb 12.8 oz (191.3 kg)  04/02/23 (!) 411 lb (186.4 kg)  12/28/22 (!) 404 lb 3.2 oz (183.3  kg)     GEN: Quite obese morbid BMI exceeds 50 well nourished, well developed in no acute distress HEENT: Normal NECK: No JVD; No carotid bruits LYMPHATICS: No lymphadenopathy CARDIAC: Distant heart sounds RRR, no murmurs, rubs, gallops RESPIRATORY:  Clear to auscultation without rales, wheezing or rhonchi  ABDOMEN: Soft, non-tender, non-distended MUSCULOSKELETAL:  No edema; No deformity  SKIN: Warm and dry NEUROLOGIC:  Alert and oriented x 3 PSYCHIATRIC:  Normal affect    Signed, Norman Herrlich, MD  04/25/2023 1:47 PM    [facility]

## 2023-04-25 ENCOUNTER — Ambulatory Visit: Payer: BC Managed Care – PPO | Attending: Cardiology | Admitting: Cardiology

## 2023-04-25 ENCOUNTER — Encounter: Payer: Self-pay | Admitting: Cardiology

## 2023-04-25 VITALS — BP 120/90 | HR 60 | Ht 74.0 in | Wt >= 6400 oz

## 2023-04-25 DIAGNOSIS — Z79899 Other long term (current) drug therapy: Secondary | ICD-10-CM

## 2023-04-25 DIAGNOSIS — I420 Dilated cardiomyopathy: Secondary | ICD-10-CM | POA: Diagnosis not present

## 2023-04-25 DIAGNOSIS — I5042 Chronic combined systolic (congestive) and diastolic (congestive) heart failure: Secondary | ICD-10-CM

## 2023-04-25 DIAGNOSIS — I480 Paroxysmal atrial fibrillation: Secondary | ICD-10-CM | POA: Diagnosis not present

## 2023-04-25 DIAGNOSIS — Z7901 Long term (current) use of anticoagulants: Secondary | ICD-10-CM

## 2023-04-25 DIAGNOSIS — I110 Hypertensive heart disease with heart failure: Secondary | ICD-10-CM

## 2023-04-25 NOTE — Patient Instructions (Signed)
Medication Instructions:  Your physician recommends that you continue on your current medications as directed. Please refer to the Current Medication list given to you today.  *If you need a refill on your cardiac medications before your next appointment, please call your pharmacy*   Lab Work: None If you have labs (blood work) drawn today and your tests are completely normal, you will receive your results only by: MyChart Message (if you have MyChart) OR A paper copy in the mail If you have any lab test that is abnormal or we need to change your treatment, we will call you to review the results.   Testing/Procedures: None   Follow-Up: At [facility], you and your health needs are our priority.  As part of our continuing mission to provide you with exceptional heart care, we have created designated Provider Care Teams.  These Care Teams include your primary Cardiologist (physician) and Advanced Practice Providers (APPs -  Physician Assistants and Nurse Practitioners) who all work together to provide you with the care you need, when you need it.  We recommend signing up for the patient portal called "MyChart".  Sign up information is provided on this After Visit Summary.  MyChart is used to connect with patients for Virtual Visits (Telemedicine).  Patients are able to view lab/test results, encounter notes, upcoming appointments, etc.  Non-urgent messages can be sent to your provider as well.   To learn more about what you can do with MyChart, go to [email or web address].    Your next appointment:   6 month(s)  Provider:   Norman Herrlich, MD    Other Instructions None

## 2023-06-16 ENCOUNTER — Other Ambulatory Visit: Payer: Self-pay | Admitting: Cardiology

## 2023-06-16 DIAGNOSIS — I4819 Other persistent atrial fibrillation: Secondary | ICD-10-CM

## 2023-06-18 NOTE — Telephone Encounter (Signed)
 Prescription refill request for Xarelto received.  Indication:afib Last office visit:2/25 Weight:191.3  kg Age:53 Scr:0.82  9/24 CrCl:278.66  ml/min  Prescription refilled

## 2023-06-25 ENCOUNTER — Other Ambulatory Visit: Payer: Self-pay | Admitting: Cardiology

## 2023-07-04 ENCOUNTER — Other Ambulatory Visit: Payer: Self-pay | Admitting: Cardiology

## 2023-07-25 ENCOUNTER — Other Ambulatory Visit: Payer: Self-pay | Admitting: Physician Assistant

## 2023-08-23 DIAGNOSIS — E782 Mixed hyperlipidemia: Secondary | ICD-10-CM | POA: Diagnosis not present

## 2023-08-23 DIAGNOSIS — I509 Heart failure, unspecified: Secondary | ICD-10-CM | POA: Diagnosis not present

## 2023-08-23 DIAGNOSIS — M25561 Pain in right knee: Secondary | ICD-10-CM | POA: Diagnosis not present

## 2023-08-23 DIAGNOSIS — Z79899 Other long term (current) drug therapy: Secondary | ICD-10-CM | POA: Diagnosis not present

## 2023-08-23 DIAGNOSIS — R739 Hyperglycemia, unspecified: Secondary | ICD-10-CM | POA: Diagnosis not present

## 2023-08-27 ENCOUNTER — Ambulatory Visit: Payer: BC Managed Care – PPO | Admitting: Cardiology

## 2023-10-05 ENCOUNTER — Telehealth: Payer: Self-pay | Admitting: *Deleted

## 2023-10-05 NOTE — Telephone Encounter (Signed)
 Received fax from [facility] requesting Disability for A-fib for pt. Already refused by [name]. Sent return fax stating that [name] is out until Aug 5th and pt needs to be evaluated by doctor to discuss this and be examined.

## 2023-10-21 NOTE — Progress Notes (Unsigned)
 Electrophysiology Office Note:   Date:  10/22/2023  ID:  Jesus Barnes, DOB 1970-06-15, MRN [id number]  Primary Cardiologist: Redell Leiter, MD Primary Heart Failure: None Electrophysiologist: Ahmiyah Coil Gladis Norton, MD      History of Present Illness:   Jesus Barnes is a 53 y.o. male with h/o atrial fibrillation, hypertension, coronary artery disease, obesity, sleep apnea seen today for routine electrophysiology followup.   Since last being seen in our clinic the patient reports continued episodes of atrial fibrillation.  He is in atrial fibrillation for weeks at a time.  He feels some chest pain, shortness of breath and fatigue related to his atrial fibrillation.  He continued to have atrial fibrillation despite his ablation.  He is on dofetilide .  This episode, he has been having chest discomfort, shoulder discomfort and intermittent left arm pain.  He usually converts back to sinus rhythm without intervention.  he denies palpitations, PND, orthopnea, nausea, vomiting, dizziness, syncope, edema, weight gain, or early satiety.   Review of systems complete and found to be negative unless listed in HPI.   EP Information / Studies Reviewed:    EKG is ordered today. Personal review as below.  EKG Interpretation Date/Time:  Monday October 22 2023 09:28:40 EDT Ventricular Rate:  148 PR Interval:    QRS Duration:  90 QT Interval:  300 QTC Calculation: 471 R Axis:   -6  Text Interpretation: Atrial fibrillation with rapid ventricular response Nonspecific ST abnormality When compared with ECG of 25-Apr-2023 13:31, Atrial fibrillation has replaced Sinus rhythm Vent. rate has increased BY  88 BPM Confirmed by Dajohn Ellender ([id number]) on 10/22/2023 9:36:07 AM     Risk Assessment/Calculations:    CHA2DS2-VASc Score = 2   This indicates a 2.2% annual risk of stroke. The patient's score is based upon: CHF History: 1 HTN History: 1 Diabetes History: 0 Stroke History: 0 Vascular Disease History:  0 Age Score: 0 Gender Score: 0            Physical Exam:   VS:  BP 124/80   Pulse (!) 148   Ht 6' 2 (1.88 m)   Wt (!) 424 lb 9.6 oz (192.6 kg)   SpO2 96%   BMI 54.52 kg/m    Wt Readings from Last 3 Encounters:  10/22/23 (!) 424 lb 9.6 oz (192.6 kg)  04/25/23 (!) 421 lb 12.8 oz (191.3 kg)  04/02/23 (!) 411 lb (186.4 kg)     GEN: Well nourished, well developed in no acute distress NECK: No JVD; No carotid bruits CARDIAC: Irregularly irregular rate and rhythm, no murmurs, rubs, gallops RESPIRATORY:  Clear to auscultation without rales, wheezing or rhonchi  ABDOMEN: Soft, non-tender, non-distended EXTREMITIES:  No edema; No deformity   ASSESSMENT AND PLAN:    1.  Longstanding persistent atrial fibrillation: Post ablation 12/01/2022.  On dofetilide .  He is unfortunately continued to have episodes of atrial fibrillation.  He was aware prior to his ablation that his chances of success were low.  Hattie Aguinaldo load with amiodarone  and get an EKG in 2 weeks.  If he is in atrial fibrillation at that time, we Anchor Dwan plan for cardioversion.  If he does not convert, may need to adopt a rate control strategy.  I am concerned with his rapid heart rates of a tachycardia induced cardiomyopathy.  2.  Secondary hypercoagulable state: On Xarelto   3.  High-risk medication monitoring: On dofetilide .  Stopping today and Cystal Shannahan start amiodarone .  4.  Chronic systolic heart failure: Ejection fraction  improved with maintenance of sinus rhythm.  Continue with current management per primary cardiology.  5.  Hypertension: Well-controlled  6.  Obesity: Lifestyle modification encouraged.  He would benefit from [facility] or Zepbound.  This can be prescribed by his primary physician.  Follow up with [name] in 3 months  Signed, Taijuan Serviss Gladis Inocencio, MD

## 2023-10-22 ENCOUNTER — Encounter: Payer: Self-pay | Admitting: Cardiology

## 2023-10-22 ENCOUNTER — Ambulatory Visit: Attending: Cardiology | Admitting: Cardiology

## 2023-10-22 VITALS — BP 124/80 | HR 148 | Ht 74.0 in | Wt >= 6400 oz

## 2023-10-22 DIAGNOSIS — I4811 Longstanding persistent atrial fibrillation: Secondary | ICD-10-CM | POA: Diagnosis not present

## 2023-10-22 DIAGNOSIS — Z79899 Other long term (current) drug therapy: Secondary | ICD-10-CM

## 2023-10-22 DIAGNOSIS — I5022 Chronic systolic (congestive) heart failure: Secondary | ICD-10-CM

## 2023-10-22 DIAGNOSIS — I10 Essential (primary) hypertension: Secondary | ICD-10-CM | POA: Diagnosis not present

## 2023-10-22 DIAGNOSIS — D6869 Other thrombophilia: Secondary | ICD-10-CM

## 2023-10-22 MED ORDER — AMIODARONE HCL 200 MG PO TABS: ORAL_TABLET | ORAL | 2 refills | Status: DC

## 2023-10-22 NOTE — Patient Instructions (Addendum)
 Medication Instructions:  Your physician has recommended you make the following change in your medication:  Stop tikosyn   Start amiodarone : Take 400mg  by mouth twice daily for 2 weeks. Take 200mg  twice daily for 2 weeks. Then take 200mg  daily there after.  Lab Work: None  You may go to any [facility] Location for your lab work:  [place] - [address] ([facility]) - [address] [phone] [address]  [place] - [address]   [place]  - [address]   [place] - [address] - [address] [employer]  - [address] [place] [address] ([facility]   If you have labs (blood work) drawn today and your tests are completely normal, you will receive your results only by: [employer] (if you have MyChart)  If you have any lab test that is abnormal or we need to change your treatment, we will call you or send a MyChart message to review the results.  Testing/Procedures: None ordered.  Follow-Up: At [facility], you and your health needs are our priority.  As part of our continuing mission to provide you with exceptional heart care, we have created designated Provider Care Teams.  These Care Teams include your primary Cardiologist (physician) and Advanced Practice Providers (APPs -  Physician Assistants and Nurse Practitioners) who all work together to provide you with the care you need, when you need it.   Your next appointment:   EKG Nurse visit in 2 weeks  3 months with Dr Inocencio  The format for your next appointment:   In Person  Provider:   Soyla Inocencio, MD or one of the following Advanced Practice Providers on your designated Care Team:   Charlies Arthur, [place] Ozell Jodie Passey, [place] Leotis Barrack, NP  Note: Remote monitoring is used to monitor your Pacemaker/ ICD from home. This monitoring reduces the number of office visits required to  check your device to one time per year. It allows us  to keep an eye on the functioning of your device to ensure it is working properly.

## 2023-10-23 ENCOUNTER — Ambulatory Visit: Payer: Self-pay

## 2023-10-23 DIAGNOSIS — I4811 Longstanding persistent atrial fibrillation: Secondary | ICD-10-CM

## 2023-10-23 DIAGNOSIS — D6869 Other thrombophilia: Secondary | ICD-10-CM

## 2023-10-23 DIAGNOSIS — Z79899 Other long term (current) drug therapy: Secondary | ICD-10-CM

## 2023-10-23 LAB — HEPATIC FUNCTION PANEL
ALT: 45 IU/L — ABNORMAL HIGH (ref 0–44)
AST: 44 IU/L — ABNORMAL HIGH (ref 0–40)
Albumin: 4.3 g/dL (ref 3.8–4.9)
Alkaline Phosphatase: 82 IU/L (ref 44–121)
Bilirubin Total: 0.6 mg/dL (ref 0.0–1.2)
Bilirubin, Direct: 0.17 mg/dL (ref 0.00–0.40)
Total Protein: 6.5 g/dL (ref 6.0–8.5)

## 2023-11-05 ENCOUNTER — Ambulatory Visit

## 2023-11-05 ENCOUNTER — Telehealth: Payer: Self-pay | Admitting: Cardiology

## 2023-11-05 NOTE — Telephone Encounter (Signed)
 Patient would like to reschedule 8/25 nurse visit. He says he has to go to [place] to get his daughter off the side of the road.

## 2023-11-08 ENCOUNTER — Ambulatory Visit: Attending: Cardiology

## 2023-11-08 ENCOUNTER — Other Ambulatory Visit: Payer: Self-pay

## 2023-11-08 VITALS — BP 150/102 | HR 72 | Resp 20 | Wt >= 6400 oz

## 2023-11-08 DIAGNOSIS — I4811 Longstanding persistent atrial fibrillation: Secondary | ICD-10-CM

## 2023-11-20 ENCOUNTER — Telehealth: Payer: Self-pay | Admitting: *Deleted

## 2023-11-20 NOTE — Telephone Encounter (Signed)
 Pt agreeable to plan.  Discussed date options, pt aware I will get scheduled and call him this week with instructions.  Patient verbalized understanding and agreeable to plan.

## 2023-11-20 NOTE — Telephone Encounter (Signed)
-----   Message from [facility] sent at 11/08/2023  2:36 PM EDT ----- Can we get him set up for DCCV?

## 2023-11-20 NOTE — Telephone Encounter (Signed)
 Aware he will need to be seen prior to DCCV.   Pt understands I will forward this to the afib clinic to reach out and schedule an OV as soon as possible to determine if DCCV  is still needed. Pt agreeable to plan.  (He will need information on how to get to office when we schedule his appt.  Aware office will send it via mychart)

## 2023-12-05 ENCOUNTER — Encounter: Payer: Self-pay | Admitting: Cardiology

## 2023-12-05 ENCOUNTER — Telehealth: Payer: Self-pay | Admitting: Cardiology

## 2023-12-05 NOTE — Telephone Encounter (Signed)
 Attempted to call Becky at [facility] to reschedule the patient's cardioversion because he had not completed his lab work. The rang multiple times and no one answered and there was no voicemail to leave a message.

## 2023-12-05 NOTE — Telephone Encounter (Signed)
 Error

## 2023-12-05 NOTE — Telephone Encounter (Signed)
 Calling because they need the orders and paperwork for the cardioversion for the patient. Procedure is schedule for tomorrow. Please advise

## 2023-12-05 NOTE — Telephone Encounter (Signed)
 Called the patient and he states that he was instructed to be at [facility] at 10 am for his cardioversion. I informed him that we had been trying to contact him to have his blood work for the cardioversion completed. He stated that they would do an EKG before the cardioversion. I informed him that I would fax the document for his cardioversion to [facility] at [facility] and contact her in the morning regarding his blood work not being completed. Patient verbalized understanding and had no further questions at this time.

## 2023-12-05 NOTE — Telephone Encounter (Signed)
 Pt's wife requesting c/b regarding pt's Cardioversion. Please advise

## 2023-12-05 NOTE — Telephone Encounter (Signed)
 Attempted to call the OR at [facility] to reschedule the patient's cardioversion because he had not completed his lab work. The phone rang multiple times and no one answered and there was no voicemail to leave a message.

## 2023-12-06 ENCOUNTER — Telehealth: Payer: Self-pay | Admitting: Cardiology

## 2023-12-06 ENCOUNTER — Other Ambulatory Visit: Payer: Self-pay | Admitting: Cardiology

## 2023-12-06 DIAGNOSIS — Z7982 Long term (current) use of aspirin: Secondary | ICD-10-CM | POA: Diagnosis not present

## 2023-12-06 DIAGNOSIS — I10 Essential (primary) hypertension: Secondary | ICD-10-CM | POA: Diagnosis not present

## 2023-12-06 DIAGNOSIS — Z79899 Other long term (current) drug therapy: Secondary | ICD-10-CM | POA: Diagnosis not present

## 2023-12-06 DIAGNOSIS — I4819 Other persistent atrial fibrillation: Secondary | ICD-10-CM | POA: Diagnosis not present

## 2023-12-06 DIAGNOSIS — Z6841 Body Mass Index (BMI) 40.0 and over, adult: Secondary | ICD-10-CM | POA: Diagnosis not present

## 2023-12-06 DIAGNOSIS — I4581 Long QT syndrome: Secondary | ICD-10-CM | POA: Diagnosis not present

## 2023-12-06 DIAGNOSIS — F17210 Nicotine dependence, cigarettes, uncomplicated: Secondary | ICD-10-CM | POA: Diagnosis not present

## 2023-12-06 DIAGNOSIS — I2510 Atherosclerotic heart disease of native coronary artery without angina pectoris: Secondary | ICD-10-CM | POA: Diagnosis not present

## 2023-12-06 DIAGNOSIS — I4891 Unspecified atrial fibrillation: Secondary | ICD-10-CM | POA: Diagnosis not present

## 2023-12-06 DIAGNOSIS — I429 Cardiomyopathy, unspecified: Secondary | ICD-10-CM | POA: Diagnosis not present

## 2023-12-06 NOTE — Telephone Encounter (Signed)
 Prescription refill request for Xarelto  received.  Indication:afib Last office visit:8/25 Weight:191  kg Age:53 Scr:0.78  6/25 CrCl:289.08  ml/min  Prescription refilled

## 2023-12-06 NOTE — Telephone Encounter (Signed)
 Called Becky at [facility] OR, who schedules the cardioversions at 8:00 am and spoke to [place]. It was explained to [place], that the patient had not come to the office to have his lab drawn for the cardioversion. [place] asked that I send over the paper work anyway. The paper work for the patient's cardioversion was faxed to [phone] after verfying it with [place]. Received a message that the fax did not go through. Called Becky at [facility] and she gave me a different number to fax the patient's paperwork to and it went through.

## 2023-12-06 NOTE — Progress Notes (Signed)
   Nurse Visit   Date of Encounter: 12/06/2023 ID: Jesus Barnes, DOB 23-Feb-1971, MRN [id number]  PCP:  [name], PA-C   [facility] Providers Cardiologist:  Redell Leiter, MD Electrophysiologist:  Soyla Gladis Norton, MD      Visit Details   VS:  BP (!) 150/102 (BP Location: Right Arm, Patient Position: Sitting, Cuff Size: Normal)   Pulse 72   Resp 20   Wt (!) 421 lb (191 kg)   SpO2 98%   BMI 54.05 kg/m  , BMI Body mass index is 54.05 kg/m.  Wt Readings from Last 3 Encounters:  11/08/23 (!) 421 lb (191 kg)  10/22/23 (!) 424 lb 9.6 oz (192.6 kg)  04/25/23 (!) 421 lb 12.8 oz (191.3 kg)     Reason for visit: Perform EKG Performed today: EKG, Vitals, Education and Provider Consulted Changes (medications, testing, etc.) : Set-up for Cardioversion Length of Visit: 25 minutes    Medications Adjustments/Labs and Tests Ordered: Orders Placed This Encounter  Procedures   CBC   Basic Metabolic Panel (BMET)   EKG 12-Lead   No orders of the defined types were placed in this encounter.    Signed, Charlie LILLETTE Free, RN  12/06/2023 9:52 AM

## 2023-12-06 NOTE — Telephone Encounter (Signed)
Patient calling the office for samples of medication:   1.  What medication and dosage are you requesting samples for? XARELTO 20 MG TABS tablet   2.  Are you currently out of this medication? Yes    

## 2023-12-07 DIAGNOSIS — I4819 Other persistent atrial fibrillation: Secondary | ICD-10-CM | POA: Diagnosis not present

## 2023-12-07 DIAGNOSIS — I4581 Long QT syndrome: Secondary | ICD-10-CM | POA: Diagnosis not present

## 2023-12-07 DIAGNOSIS — I4891 Unspecified atrial fibrillation: Secondary | ICD-10-CM | POA: Diagnosis not present

## 2023-12-19 ENCOUNTER — Ambulatory Visit: Attending: Cardiology | Admitting: Cardiology

## 2023-12-19 ENCOUNTER — Encounter: Payer: Self-pay | Admitting: Cardiology

## 2023-12-19 VITALS — BP 120/78 | HR 86 | Ht 74.0 in | Wt >= 6400 oz

## 2023-12-19 DIAGNOSIS — I420 Dilated cardiomyopathy: Secondary | ICD-10-CM | POA: Diagnosis not present

## 2023-12-19 DIAGNOSIS — D6869 Other thrombophilia: Secondary | ICD-10-CM

## 2023-12-19 DIAGNOSIS — I4811 Longstanding persistent atrial fibrillation: Secondary | ICD-10-CM | POA: Diagnosis not present

## 2023-12-19 DIAGNOSIS — Z79899 Other long term (current) drug therapy: Secondary | ICD-10-CM | POA: Diagnosis not present

## 2023-12-19 DIAGNOSIS — I5022 Chronic systolic (congestive) heart failure: Secondary | ICD-10-CM

## 2023-12-19 MED ORDER — SACUBITRIL-VALSARTAN 24-26 MG PO TABS: 1.0000 | ORAL_TABLET | Freq: Two times a day (BID) | ORAL | 3 refills | Status: AC

## 2023-12-19 NOTE — Patient Instructions (Addendum)
 Medication Instructions:  Your physician has recommended you make the following change in your medication:   START: Entresto  24 - 26 two times daily  *If you need a refill on your cardiac medications before your next appointment, please call your pharmacy*  Lab Work: Your physician recommends that you return for lab work in:   Labs in 1 week: TSH T3 T4, BMP, Pro BNP  If you have labs (blood work) drawn today and your tests are completely normal, you will receive your results only by: MyChart Message (if you have MyChart) OR A paper copy in the mail If you have any lab test that is abnormal or we need to change your treatment, we will call you to review the results.  Testing/Procedures: None  Follow-Up: At [facility], you and your health needs are our priority.  As part of our continuing mission to provide you with exceptional heart care, our providers are all part of one team.  This team includes your primary Cardiologist (physician) and Advanced Practice Providers or APPs (Physician Assistants and Nurse Practitioners) who all work together to provide you with the care you need, when you need it.  Your next appointment:   3 month(s)  Provider:   Redell Leiter, MD    We recommend signing up for the patient portal called MyChart.  Sign up information is provided on this After Visit Summary.  MyChart is used to connect with patients for Virtual Visits (Telemedicine).  Patients are able to view lab/test results, encounter notes, upcoming appointments, etc.  Non-urgent messages can be sent to your provider as well.   To learn more about what you can do with MyChart, go to [email or web address].   Other Instructions None

## 2023-12-19 NOTE — Progress Notes (Signed)
 Cardiology Office Note:    Date:  12/19/2023   ID:  Jesus Barnes, DOB 06/09/1970, MRN [id number]  PCP:  Jesus Pock, PA-C  Cardiologist:  Jesus Leiter, MD    Referring MD: Jesus Pock, PA-C    ASSESSMENT:    1. Longstanding persistent atrial fibrillation (HCC)   2. On amiodarone  therapy   3. Secondary hypercoagulable state   4. Dilated cardiomyopathy (HCC)   5. Chronic systolic (congestive) heart failure (HCC)    PLAN:    In order of problems listed above:  Very complex case with refractory atrial fibrillation 2 ablations and all antiarrhythmic drugs Although his rate is now controlled with amiodarone  he is complaining of disequilibrium blurred vision and I do not think he can remain on the drug long-term Will improve the quality of his life I will ask EP to consider AV nodal ablation and pacemaker with CRT Restart Entresto  with cardiomyopathy 1 week will check labs including thyroid  BMP proBNP will do an office BP check and consider quick uptitration of the Entresto  as tolerated   Next appointment: 3 months   Medication Adjustments/Labs and Tests Ordered: Current medicines are reviewed at length with the patient today.  Concerns regarding medicines are outlined above.  Orders Placed This Encounter  Procedures   TSH+T4F+T3Free   Basic Metabolic Panel (BMET)   Pro b natriuretic peptide (BNP)   EKG 12-Lead   Meds ordered this encounter  Medications   sacubitril-valsartan (ENTRESTO ) 24-26 MG    Sig: Take 1 tablet by mouth 2 (two) times daily.    Dispense:  180 tablet    Refill:  3     History of Present Illness:    Jesus Barnes is a 53 y.o. male with a hx of atrial fibrillation hypertensive heart disease with heart failure dilated cardiomyopathy obesity sleep apnea last seen 10/22/2023.  He was loaded with amiodarone  and attempted cardioversion [facility] unsuccessful.  He returns for follow-up he is feeling worse than amiodarone  with disequilibrium  and falls and visual complaints.  I think long-term is a poor choice for him it was ineffective to allow for cardioversion and although it helps with rate control I do not think that he is going to tolerate it with his ongoing symptoms previously was on Entresto  nobody sure why he is not taking it but his EF is reduced in the range of 40% I will place him back on and a nice meeting with the patient and his wife and think he is best served by AV nodal ablation for rate control and guideline directed medical therapy for his cardiomyopathy.  I will send a copy to EP. He is not having edema or shortness of breath but complains of profound fatigue exercise intolerance anxiety no chest pain palpitation or syncope  Compliance with diet, lifestyle and medications: Yes Past Medical History:  Diagnosis Date   A-fib (HCC) 04/18/2022   Angina pectoris 04/05/2016   Anxiety 09/02/2014   Back pain    Burn    Chest pain    Chronic anticoagulation 10/16/2016   Chronic fatigue syndrome    Depression    Dilated cardiomyopathy (HCC) 10/16/2016   EF 30-35%, 05/15/16 40%, 07/17/18 55-60%   Dysphagia    Encounter for monitoring dofetilide  therapy 10/16/2016   Esophageal stricture 09/02/2014   Essential hypertension 09/01/2014   GERD (gastroesophageal reflux disease)    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HCC)    High cholesterol    High risk medication use 09/01/2014   Overview:  Flecanide     Hypertension    Hypertension, essential 08/08/2018   Hypertensive heart disease 09/01/2014   Joint pain    Mild CAD 10/16/2016   Morbid obesity with body mass index (BMI) of 45.0 to 49.9 in adult [facility]) 04/05/2016   Obstructive sleep apnea    awaiting treatment   On amiodarone  therapy 10/16/2016   Palpitation    Paroxysmal atrial fibrillation (HCC) 12/12/2016   Persistent atrial fibrillation (HCC) 09/01/2014   Overview:  CHADS2=1   Sleep apnea    SOB (shortness of breath)     Current Medications: Current Meds  Medication Sig    amiodarone  (PACERONE ) 200 MG tablet Take 400mg  by mouth twice daily for 2 weeks. Take 200mg  twice daily for 2 weeks. Then take 200mg  daily there after.   aspirin-acetaminophen -caffeine (EXCEDRIN MIGRAINE) 250-250-65 MG tablet Take 3 tablets by mouth daily as needed for headache.   atorvastatin (LIPITOR) 10 MG tablet Take 10 mg by mouth daily.   diltiazem  (CARDIZEM ) 30 MG tablet Take 30 mg by mouth as needed. For HR greater than 100   furosemide  (LASIX ) 40 MG tablet Take 1 tablet (40 mg total) by mouth daily. (Patient taking differently: Take 40 mg by mouth daily as needed for fluid or edema.)   metoprolol  tartrate (LOPRESSOR ) 50 MG tablet Take 50 mg by mouth daily.   nitroGLYCERIN  (NITROSTAT ) 0.4 MG SL tablet Place 0.4 mg under the tongue every 5 (five) minutes as needed for chest pain.   pantoprazole  (PROTONIX ) 40 MG tablet Take 40 mg by mouth daily.   potassium chloride  SA (KLOR-CON  M20) 20 MEQ tablet Take 1 tablet (20 mEq total) by mouth daily.   sacubitril-valsartan (ENTRESTO ) 24-26 MG Take 1 tablet by mouth 2 (two) times daily.   spironolactone  (ALDACTONE ) 25 MG tablet TAKE 1 TABLET (25 MG TOTAL) BY MOUTH DAILY.   temazepam (RESTORIL) 15 MG capsule Take 15 mg by mouth at bedtime as needed for sleep.   XARELTO  20 MG TABS tablet TAKE 1 TABLET BY MOUTH DAILY WITH SUPPER      EKGs/Labs/Other Studies Reviewed:    The following studies were reviewed today:  Cardiac Studies & Procedures   ______________________________________________________________________________________________     ECHOCARDIOGRAM  ECHOCARDIOGRAM COMPLETE 04/18/2023  Narrative ECHOCARDIOGRAM REPORT    Patient Name:   Jesus Barnes Date of Exam: 04/18/2023 Medical Rec #:  [id number]       Height:       74.5 in Accession #:    [id number]      Weight:       411.0 lb Date of Birth:  August 25, 1970        BSA:          2.966 m Patient Age:    52 years        BP:           126/84 mmHg Patient Gender: M               HR:            117 bpm. Exam Location:  [place]  Procedure: 2D Echo, Cardiac Doppler, Color Doppler and Intracardiac Opacification Agent  Indications:    Persistent atrial fibrillation (HCC) [I48.19 (ICD-10-CM)]; Angina pectoris (HCC) [I20.9 (ICD-10-CM)]  History:        Patient has prior history of Echocardiogram examinations, most recent 07/18/2022. CHF and Cardiomyopathy, CAD, Arrythmias:Atrial Fibrillation, Signs/Symptoms:Chest Pain and Shortness of Breath; Risk Factors:Hypertension and Dyslipidemia.  Sonographer:    Charlie Jointer RDCS Referring  Phys: [id number] WILL MARTIN CAMNITZ   Sonographer Comments: Image acquisition challenging due to patient body habitus. IMPRESSIONS   1. Left ventricular ejection fraction, by estimation, is 40 to 45%. The left ventricle has mildly decreased function. The left ventricle demonstrates global hypokinesis. Left ventricular diastolic parameters are indeterminate. 2. Right ventricular systolic function is normal. The right ventricular size is mildly enlarged. 3. Right atrial size was mildly dilated. 4. The mitral valve was not well visualized. No evidence of mitral valve regurgitation. No evidence of mitral stenosis. 5. The aortic valve is tricuspid. Aortic valve regurgitation is not visualized. No aortic stenosis is present.  Comparison(s): A prior study was performed on 07/18/2022. LVEF was reported 45-50%.  Conclusion(s)/Recommendation(s): Extremely limited visualization despite Definity  contrast use; Consider alternate diagnostic modalities if clinically indicated.  FINDINGS Left Ventricle: Left ventricular ejection fraction, by estimation, is 40 to 45%. The left ventricle has mildly decreased function. The left ventricle demonstrates global hypokinesis. Definity  contrast agent was given IV to delineate the left ventricular endocardial borders. The left ventricular internal cavity size was normal in size. Suboptimal image quality limits for  assessment of left ventricular hypertrophy. Left ventricular diastolic parameters are indeterminate.  Right Ventricle: The right ventricular size is mildly enlarged. Right vetricular wall thickness was not well visualized. Right ventricular systolic function is normal.  Left Atrium: Left atrial size was not well visualized.  Right Atrium: Right atrial size was mildly dilated.  Pericardium: There is no evidence of pericardial effusion.  Mitral Valve: The mitral valve was not well visualized. No evidence of mitral valve regurgitation. No evidence of mitral valve stenosis.  Tricuspid Valve: The tricuspid valve is not well visualized.  Aortic Valve: The aortic valve is tricuspid. Aortic valve regurgitation is not visualized. No aortic stenosis is present.  Pulmonic Valve: The pulmonic valve was not well visualized.  Aorta: The aortic root measuring 4.3cm and ascending aorta diameter 3.7cm are structurally normal, with no evidence of dilitation when indexed to body surface area.  Venous: The inferior vena cava was not well visualized.  IAS/Shunts: No atrial level shunt detected by color flow Doppler.   LEFT VENTRICLE PLAX 2D LVIDd:         5.30 cm   Diastology LVIDs:         4.70 cm   LV e' medial:    13.60 cm/s LV PW:         1.50 cm   LV E/e' medial:  5.7 LV IVS:        1.50 cm   LV e' lateral:   12.10 cm/s LVOT diam:     2.50 cm   LV E/e' lateral: 6.5 LV SV:         58 LV SV Index:   20 LVOT Area:     4.91 cm   RIGHT VENTRICLE             IVC RV Basal diam:  5.00 cm     IVC diam: 2.70 cm RV Mid diam:    4.40 cm RV S prime:     12.40 cm/s TAPSE (M-mode): 2.8 cm  LEFT ATRIUM           Index        RIGHT ATRIUM           Index LA diam:      6.00 cm 2.02 cm/m   RA Area:     19.80 cm LA Vol (A4C): 71.1 ml 23.98 ml/m  RA Volume:  58.90 ml  19.86 ml/m AORTIC VALVE LVOT Vmax:   70.40 cm/s LVOT Vmean:  46.433 cm/s LVOT VTI:    0.118 m  AORTA Ao Root diam: 4.30 cm Ao  Asc diam:  3.70 cm  MITRAL VALVE MV Area (PHT): 3.48 cm    SHUNTS MV Decel Time: 218 msec    Systemic VTI:  0.12 m MV E velocity: 78.10 cm/s  Systemic Diam: 2.50 cm  Sreedhar reddy Madireddy Electronically signed by Alean reddy Madireddy Signature Date/Time: 04/18/2023/7:11:38 PM    Final          ______________________________________________________________________________________________      EKG Interpretation Date/Time:  Wednesday December 19 2023 16:16:07 EDT Ventricular Rate:  86 PR Interval:    QRS Duration:  98 QT Interval:  388 QTC Calculation: 464 R Axis:   -2  Text Interpretation: Atrial fibrillation CONTROLLED VENTRICULAR RESPONSE When compared with ECG of 08-Nov-2023 13:39, No significant change was found Confirmed by Monetta Rogue ([id number]) on 12/19/2023 4:33:06 PM   Recent Labs: 12/28/2022: Magnesium 2.0 10/22/2023: ALT 45  Recent Lipid Panel    Component Value Date/Time   CHOL 165 05/27/2020 1209   TRIG 105 05/27/2020 1209   HDL 52 05/27/2020 1209   LDLCALC 94 05/27/2020 1209    Physical Exam:    VS:  BP 120/78   Pulse 86   Ht 6' 2 (1.88 m)   Wt (!) 424 lb 12.8 oz (192.7 kg)   SpO2 95%   BMI 54.54 kg/m     Wt Readings from Last 3 Encounters:  12/19/23 (!) 424 lb 12.8 oz (192.7 kg)  11/08/23 (!) 421 lb (191 kg)  10/22/23 (!) 424 lb 9.6 oz (192.6 kg)     GEN: Quite obese well nourished, well developed in no acute distress HEENT: Normal NECK: No JVD; No carotid bruits LYMPHATICS: No lymphadenopathy CARDIAC: Distant irregular variable first heart sound  RESPIRATORY:  Clear to auscultation without rales, wheezing or rhonchi  ABDOMEN: Soft, non-tender, non-distended MUSCULOSKELETAL:  No edema; No deformity  SKIN: Warm and dry NEUROLOGIC:  Alert and oriented x 3 PSYCHIATRIC:  Normal affect    Signed, Rogue Monetta, MD  12/19/2023 4:57 PM    [facility]

## 2023-12-24 DIAGNOSIS — F411 Generalized anxiety disorder: Secondary | ICD-10-CM | POA: Diagnosis not present

## 2023-12-24 DIAGNOSIS — E782 Mixed hyperlipidemia: Secondary | ICD-10-CM | POA: Diagnosis not present

## 2023-12-24 DIAGNOSIS — I4891 Unspecified atrial fibrillation: Secondary | ICD-10-CM | POA: Diagnosis not present

## 2023-12-24 DIAGNOSIS — I10 Essential (primary) hypertension: Secondary | ICD-10-CM | POA: Diagnosis not present

## 2023-12-24 DIAGNOSIS — Z79899 Other long term (current) drug therapy: Secondary | ICD-10-CM | POA: Diagnosis not present

## 2023-12-26 ENCOUNTER — Ambulatory Visit: Attending: Cardiology

## 2023-12-26 VITALS — Resp 20 | Wt >= 6400 oz

## 2023-12-26 DIAGNOSIS — H532 Diplopia: Secondary | ICD-10-CM

## 2023-12-26 NOTE — Progress Notes (Signed)
   Nurse Visit   Date of Encounter: 12/26/2023 ID: Jesus Barnes, DOB 05-28-1970, MRN [id number]  PCP:  [name], PA-C   [facility] Providers Cardiologist:  Redell Leiter, MD Electrophysiologist:  Soyla Gladis Norton, MD      Visit Details   VS:  Resp 20   Wt (!) 421 lb (191 kg)   SpO2 98%   BMI 54.05 kg/m  , BMI Body mass index is 54.05 kg/m.  Wt Readings from Last 3 Encounters:  12/26/23 (!) 421 lb (191 kg)  12/19/23 (!) 424 lb 12.8 oz (192.7 kg)  11/08/23 (!) 421 lb (191 kg)     Reason for visit: Perform blood pressure check Performed today: Vitals, Provider consulted and Education Changes (medications, testing, etc.) : Patient reported that he was having diplopia when he changed positions. Dr. Leiter ordered for the patient to see an optometrist. An ambulatory referral was placed to an optometrist. Length of Visit: 25 minutes    Medications Adjustments/Labs and Tests Ordered: Orders Placed This Encounter  Procedures   Ambulatory referral to Optometry   No orders of the defined types were placed in this encounter.    Signed, Charlie LILLETTE Free, RN  12/26/2023 9:04 AM

## 2024-01-02 ENCOUNTER — Other Ambulatory Visit: Payer: Self-pay | Admitting: Cardiology

## 2024-01-04 ENCOUNTER — Telehealth: Payer: Self-pay | Admitting: Cardiology

## 2024-01-04 NOTE — Telephone Encounter (Signed)
 Caller ([place] ) is following up on form faxed to [name] on 01/01/24 regarding patient's claim.  Caller wants a call back to confirm information is correct.

## 2024-01-14 NOTE — Telephone Encounter (Signed)
 Called [employer] and the representative explained that the patient's paperwork was faxed to our office on 01/02/24. I explained to her that we did not receive the fax that was sent to the office. Also the representative stated that she was not able to see what the adjuster had faxed to our office. I asked if the document could be re-faxed and she stated she would re-fax the document to our office and that it would take  1 - 2 business days for our office to receive the fax. I asked her to put it as attention to [name] to make sure I receive the fax.

## 2024-01-25 NOTE — Telephone Encounter (Signed)
 Spoke with Johnna Whitley and she stated that she had sent the patient's insurance the information they were requesting and this message could be closed out.

## 2024-02-10 NOTE — Progress Notes (Deleted)
  Electrophysiology Office Note:   Date:  02/10/2024  ID:  Jesus Barnes, DOB 1970/07/31, MRN [id number]  Primary Cardiologist: Redell Leiter, MD Primary Heart Failure: None Electrophysiologist: Veona Bittman Gladis Norton, MD  {Click to update primary MD,subspecialty MD or APP then REFRESH:1}    History of Present Illness:   Jesus Barnes is a 53 y.o. male with h/o atrial fibrillation, hypertension, coronary disease, obesity, sleep apnea seen today for routine electrophysiology followup.   Since last being seen in our clinic the patient reports doing ***.  he denies chest pain, palpitations, dyspnea, PND, orthopnea, nausea, vomiting, dizziness, syncope, edema, weight gain, or early satiety.   Review of systems complete and found to be negative unless listed in HPI.   EP Information / Studies Reviewed:    {EKGtoday:28818}       Risk Assessment/Calculations:    CHA2DS2-VASc Score = 2  {Confirm score is correct.  If not, click here to update score.  REFRESH note.  :1} This indicates a 2.2% annual risk of stroke. The patient'Jesus score is based upon: CHF History: 1 HTN History: 1 Diabetes History: 0 Stroke History: 0 Vascular Disease History: 0 Age Score: 0 Gender Score: 0   {This patient has a significant risk of stroke if diagnosed with atrial fibrillation.  Please consider VKA or DOAC agent for anticoagulation if the bleeding risk is acceptable.   You can also use the SmartPhrase .[email or web address] for documentation.   :[id number]}         Physical Exam:   VS:  There were no vitals taken for this visit.   Wt Readings from Last 3 Encounters:  12/26/23 (!) 421 lb (191 kg)  12/19/23 (!) 424 lb 12.8 oz (192.7 kg)  11/08/23 (!) 421 lb (191 kg)     GEN: Well nourished, well developed in no acute distress NECK: No JVD; No carotid bruits CARDIAC: {EPRHYTHM:28826}, no murmurs, rubs, gallops RESPIRATORY:  Clear to auscultation without rales, wheezing or rhonchi  ABDOMEN: Soft, non-tender,  non-distended EXTREMITIES:  No edema; No deformity   ASSESSMENT AND PLAN:    1.  Longstanding persistent atrial fibrillation: Post ablation 12/01/2022.  On amiodarone .***  2.  Secondary hypercoagulable state: On Xarelto   3.  High-risk medication monitoring: On amiodarone .  LFTs and TSH checked today.  4.  Chronic systolic heart failure: Ejection fraction is improved with maintenance of sinus rhythm.  Plan per primary cardiology.  5.  Hypertension:***  6.  Obesity: Lifestyle modification encouraged.  Would benefit from weight loss medications per primary physician  Follow up with {EPMDS:28135::EP Team} {EPFOLLOW LE:71826}  Signed, Khaleed Holan Gladis Norton, MD

## 2024-02-11 ENCOUNTER — Ambulatory Visit: Admitting: Cardiology

## 2024-02-20 ENCOUNTER — Ambulatory Visit (HOSPITAL_COMMUNITY): Admission: RE | Admit: 2024-02-20 | Discharge: 2024-02-20 | Attending: Internal Medicine | Admitting: Internal Medicine

## 2024-02-20 ENCOUNTER — Encounter (HOSPITAL_COMMUNITY): Payer: Self-pay | Admitting: Internal Medicine

## 2024-02-20 VITALS — BP 124/90 | HR 103 | Ht 74.0 in | Wt >= 6400 oz

## 2024-02-20 DIAGNOSIS — I4819 Other persistent atrial fibrillation: Secondary | ICD-10-CM

## 2024-02-20 DIAGNOSIS — D6869 Other thrombophilia: Secondary | ICD-10-CM | POA: Diagnosis not present

## 2024-02-20 DIAGNOSIS — Z79899 Other long term (current) drug therapy: Secondary | ICD-10-CM | POA: Diagnosis not present

## 2024-02-20 LAB — CBC
Hematocrit: 47.4 % (ref 37.5–51.0)
Hemoglobin: 15.6 g/dL (ref 13.0–17.7)
MCH: 29.9 pg (ref 26.6–33.0)
MCHC: 32.9 g/dL (ref 31.5–35.7)
MCV: 91 fL (ref 79–97)
Platelets: 255 x10E3/uL (ref 150–450)
RBC: 5.22 x10E6/uL (ref 4.14–5.80)
RDW: 14.8 % (ref 11.6–15.4)
WBC: 6.5 x10E3/uL (ref 3.4–10.8)

## 2024-02-20 LAB — BASIC METABOLIC PANEL WITH GFR
BUN/Creatinine Ratio: 18 (ref 9–20)
BUN: 17 mg/dL (ref 6–24)
CO2: 26 mmol/L (ref 20–29)
Calcium: 9.5 mg/dL (ref 8.7–10.2)
Chloride: 101 mmol/L (ref 96–106)
Creatinine, Ser: 0.93 mg/dL (ref 0.76–1.27)
Glucose: 108 mg/dL — ABNORMAL HIGH (ref 70–99)
Potassium: 4.9 mmol/L (ref 3.5–5.2)
Sodium: 139 mmol/L (ref 134–144)
eGFR: 97 mL/min/1.73 (ref >59)

## 2024-02-20 NOTE — Patient Instructions (Addendum)
 Cardioversion scheduled for: 02/21/24 Thursday at 7:00 am   - Arrive at the [facility] ([address])  and check in with ADMITTING at 7:00 am    - Do not eat or drink anything after midnight the night prior to your procedure.   - Take all your morning medication (except diabetic medications) with a sip of water prior to arrival.  - Do NOT miss any doses of your blood thinner - if you should miss a dose or take a dose more than 4 hours late -- please notify our office immediately.  - You will not be able to drive home after your procedure. Please ensure you have a responsible adult to drive you home. You will need someone with you for 24 hours post procedure.     - Expect to be in the procedural area approximately 2 hours.   - If you feel as if you go back into normal rhythm prior to scheduled cardioversion, please notify our office immediately.   If your procedure is canceled in the cardioversion suite you will be charged a cancellation fee.

## 2024-02-20 NOTE — H&P (View-Only) (Signed)
 Primary Care Physician: [name], PA-C Referring Physician: [name] Cardiologist: [name] is a 53 y.o. male with a h/o dilated cardiomyopathy, HTN, persistent  afib in the past on Tikosyn  but stopped due to being ineffective and noncompliance. He was then placed on amiodarone  but cardioversion failed to convert pt and this was stopped March of 2022. It was thought that alcohol consumption was contributing to not being able to maintain SR as well.   He was seen by [name] 04/03/22 and was found to have low EF and felt poorly. It was discussed with pt that he had developed tachycardia mediated cardiomyopathy and it was vital for him to be returned to SR. He discussed repeating dofetilide  load with admission. PT states he is committed to stop using alcohol and be compliant with Tikosyn  this go round.  His lopressor  was stopped and he was placed on Toprol  XL 100 mg bid.   He is now here for Tikosyn  admit. No benadryl use. No missed anticoagulation. He is feeling lightheaded this am and BP is soft around 90 systolic. He thinks he has gotten behind on his water intake. He is pending a sleep study in the near future. He has afib with RVR today.   F/u in afib clinic, 04/27/21. He  is here for one week f/u of tikosyn  admit. He checked his HR last night and it was noted to be 55 bpm. 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 He is being compliant with tikosyn .   F/u in afib clinic,05/04/22. He continues in afib, rate controlled. 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 We discussed today to pursue cardioversion and he wants to proceed. No missed anticoagulation and compliant with tikosyn . QT is reading prolonged in afib but inaccurate when out of rhythm.   On follow up 12/28/22, he is  currently in NSR. S/p Afib ablation on 12/01/22 by [name]. Currently taking Tikosyn  500 mcg BID. No episodes of Afib since ablation. No chest pain, SOB, or trouble swallowing. Leg sites healed without issue. No missed doses of Xarelto  20 mg. He states he feels well in normal rhythm and is very appreciative. He has an upcoming appointment at [facility] to speak with someone regarding hybrid ablation.  Follow-up 02/20/2024.  Patient is currently in A-fib.  Patient is overall very frustrated today because he was told he was getting a cardioversion today.  He has been fasting since yesterday.  He took all of his morning medications.  He was last seen by [name] on 8/11 and deemed medication failure with Tikosyn .  He was transitioned to amiodarone .  Per review of notes, patient underwent cardioversion at [facility] and it was unsuccessful.  He was seen by [name] on 10/8 and noted to be in A-fib with controlled ventricular response on amiodarone .  The patient noted disequilibrium and visual complaints since starting the amiodarone .  He does state today that he has ongoing visual symptoms and is off balance at times.  He has not missed any doses of amiodarone .  No missed doses of Xarelto .  Today, he denies symptoms of orthopnea, PND, lower extremity edema, dizziness, presyncope, syncope, snoring, daytime somnolence, bleeding, or neurologic sequela. The patient is tolerating medications without  difficulties and is otherwise without complaint today.    Past Medical History:  Diagnosis Date   A-fib (HCC) 04/18/2022   Angina pectoris 04/05/2016   Anxiety 09/02/2014   Back pain    Burn    Chest pain    Chronic anticoagulation 10/16/2016   Chronic fatigue syndrome    Depression    Dilated cardiomyopathy (HCC) 10/16/2016   EF 30-35%, 05/15/16 40%, 07/17/18 55-60%   Dysphagia    Encounter for monitoring dofetilide  therapy 10/16/2016   Esophageal stricture 09/02/2014   Essential hypertension  09/01/2014   GERD (gastroesophageal reflux disease)    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HCC)    High cholesterol    High risk medication use 09/01/2014   Overview:   Flecanide     Hypertension    Hypertension, essential 08/08/2018   Hypertensive heart disease 09/01/2014   Joint pain    Mild CAD 10/16/2016   Morbid obesity with body mass index (BMI) of 45.0 to 49.9 in adult [facility]) 04/05/2016   Obstructive sleep apnea    awaiting treatment   On amiodarone  therapy 10/16/2016   Palpitation    Paroxysmal atrial fibrillation (HCC) 12/12/2016   Persistent atrial fibrillation (HCC) 09/01/2014   Overview:  CHADS2=1   Sleep apnea    SOB (shortness of breath)    Past Surgical History:  Procedure Laterality Date   APPENDECTOMY     ATRIAL FIBRILLATION ABLATION N/A 12/01/2022   Procedure: ATRIAL FIBRILLATION ABLATION;  Surgeon: [name], MD;  Location: [facility] INVASIVE CV LAB;  Service: Cardiovascular;  Laterality: N/A;   CARDIOVERSION     CARDIOVERSION N/A 12/14/2016   Procedure: CARDIOVERSION;  Surgeon: [name], MD;  Location: [facility] INVASIVE CV LAB;  Service: Cardiovascular;  Laterality: N/A;   CARDIOVERSION N/A 08/15/2018   Procedure: CARDIOVERSION;  Surgeon: [name], MD;  Location: [facility] ENDOSCOPY;  Service: Cardiovascular;  Laterality: N/A;   CARDIOVERSION N/A 04/30/2020   Procedure: CARDIOVERSION;  Surgeon: [name], MD;  Location: [facility] ENDOSCOPY;  Service: Cardiovascular;  Laterality: N/A;   CARDIOVERSION N/A 06/02/2020   Procedure: CARDIOVERSION (CATH LAB);  Surgeon: [name], MD;  Location: [facility] INVASIVE CV LAB;  Service: Cardiovascular;  Laterality: N/A;   CARDIOVERSION N/A 04/20/2022   Procedure: CARDIOVERSION;  Surgeon: [name], MD;  Location: [facility] ENDOSCOPY;  Service: Cardiovascular;  Laterality: N/A;   CARDIOVERSION N/A 05/23/2022   Procedure: CARDIOVERSION;  Surgeon: [name], MD;  Location: [facility] ENDOSCOPY;  Service: Cardiovascular;  Laterality: N/A;    COLONOSCOPY  06/18/2001    Bx: Tubulovillous adenoma. Done by Dr. Towana. He was told to come back 28yr later, but pt didnot.    ESOPHAGOGASTRODUODENOSCOPY  12/17/2014   mild gastritis. Small antral polyps (likely inflammatory). Status post empiric esophageal dilatation   HERNIA REPAIR     umblical   KNEE SURGERY     SKIN GRAFT     URETHRA SURGERY      Current Outpatient Medications  Medication Sig Dispense Refill   amiodarone  (PACERONE ) 200 MG tablet Take 400mg  by mouth twice daily for 2 weeks. Take 200mg  twice daily for 2 weeks. Then take 200mg  daily there after. 180 tablet 2   aspirin-acetaminophen -caffeine (EXCEDRIN MIGRAINE) 250-250-65 MG tablet Take 3 tablets by mouth daily as needed for headache.     clonazePAM (KLONOPIN) 1 MG tablet Take 1 mg by mouth daily.     diltiazem  (CARDIZEM ) 30 MG tablet Take 30 mg by mouth as needed. For HR greater than 100  furosemide  (LASIX ) 40 MG tablet Take 1 tablet (40 mg total) by mouth daily. 90 tablet 1   metoprolol  tartrate (LOPRESSOR ) 50 MG tablet Take 50 mg by mouth daily.     nitroGLYCERIN  (NITROSTAT ) 0.4 MG SL tablet Place 0.4 mg under the tongue every 5 (five) minutes as needed for chest pain.     pantoprazole  (PROTONIX ) 40 MG tablet Take 40 mg by mouth daily.     PARoxetine (PAXIL) 20 MG tablet Take 20 mg by mouth every morning.     potassium chloride  SA (KLOR-CON  M20) 20 MEQ tablet Take 1 tablet (20 mEq total) by mouth daily. 90 tablet 2   rosuvastatin (CRESTOR) 10 MG tablet Take 10 mg by mouth daily.     sacubitril -valsartan  (ENTRESTO ) 24-26 MG Take 1 tablet by mouth 2 (two) times daily. 180 tablet 3   spironolactone  (ALDACTONE ) 25 MG tablet TAKE 1 TABLET (25 MG TOTAL) BY MOUTH DAILY. 90 tablet 3   temazepam (RESTORIL) 15 MG capsule Take 15 mg by mouth at bedtime as needed for sleep.     XARELTO  20 MG TABS tablet TAKE 1 TABLET BY MOUTH DAILY WITH SUPPER 90 tablet 1   No current facility-administered medications for this encounter.     No Known Allergies  ROS- All systems are reviewed and negative except as per the HPI above  Physical Exam: Vitals:   02/20/24 1109  BP: (!) 124/90  Pulse: (!) 103  Weight: (!) 189.6 kg  Height: 6' 2 (1.88 m)     Wt Readings from Last 3 Encounters:  02/20/24 (!) 189.6 kg  12/26/23 (!) 191 kg  12/19/23 (!) 192.7 kg    Labs: Lab Results  Component Value Date   NA 138 11/28/2022   K 4.6 11/28/2022   CL 99 11/28/2022   CO2 21 11/28/2022   GLUCOSE 151 (H) 11/28/2022   BUN 16 11/28/2022   CREATININE 0.82 11/28/2022   CALCIUM 8.9 11/28/2022   MG 2.0 12/28/2022   Lab Results  Component Value Date   INR 1.3 (H) 06/02/2020   Lab Results  Component Value Date   CHOL 165 05/27/2020   HDL 52 05/27/2020   LDLCALC 94 05/27/2020   TRIG 105 05/27/2020   GEN- The patient is well appearing, alert and oriented x 3 today.   Neck - no JVD or carotid bruit noted Lungs- Clear to ausculation bilaterally, normal work of breathing Heart- Irregular rate and rhythm, no murmurs, rubs or gallops, PMI not laterally displaced Extremities- no clubbing, cyanosis, or edema Skin - no rash or ecchymosis noted   EKG-  EKG Interpretation Date/Time:  Wednesday February 20 2024 11:12:51 EST Ventricular Rate:  103 PR Interval:    QRS Duration:  100 QT Interval:  382 QTC Calculation: 500 R Axis:   14  Text Interpretation: Atrial fibrillation with rapid ventricular response Nonspecific ST abnormality Abnormal ECG When compared with ECG of 19-Dec-2023 16:16, PREVIOUS ECG IS PRESENT Confirmed by Terra Pac ([id number]) on 02/20/2024 11:43:56 AM    Echo 04/18/2023: 1. Left ventricular ejection fraction, by estimation, is 40 to 45%. The  left ventricle has mildly decreased function. The left ventricle  demonstrates global hypokinesis. Left ventricular diastolic parameters are  indeterminate.   2. Right ventricular systolic function is normal. The right ventricular  size is mildly enlarged.    3. Right atrial size was mildly dilated.   4. The mitral valve was not well visualized. No evidence of mitral valve  regurgitation. No evidence of mitral  stenosis.   5. The aortic valve is tricuspid. Aortic valve regurgitation is not  visualized. No aortic stenosis is present.   Comparison(s): A prior study was performed on 07/18/2022. LVEF was  reported 45-50%.   CHA2DS2-VASc Score = 2  The patient's score is based upon: CHF History: 1 HTN History: 1 Diabetes History: 0 Stroke History: 0 Vascular Disease History: 0 Age Score: 0 Gender Score: 0       ASSESSMENT AND PLAN: Persistent Atrial Fibrillation (ICD10:  I48.19) The patient's CHA2DS2-VASc score is 2, indicating a 2.2% annual risk of stroke.   Pt failed Tikosyn  in the past for noncompliance and amiodarone  was stopped in 05/2020 for being refractory to afib. Multiple cardioversion's over the years.  He has stopped alcohol use.   S/p Afib ablation on 12/01/22 by [name].   Patient is currently in A-fib.  He was last seen by [name] on 10/22/2023 and Tikosyn  stopped due to medication failure.  Patient was transitioned to amiodarone  and underwent unsuccessful cardioversion at [facility].  There is a limitation of joules of energy at [facility] due to machine limitation.  Patient noted to [name] in October that he had disequilibrium and visual symptoms since starting the medication.  Patient notes he has same symptoms going on and it appears to have coincided with start of amiodarone .  Patient has a follow-up scheduled with [name] next month. [name] was concerned that patient may not be a candidate for long-term amiodarone  therapy given age and symptoms.    Patient has no other antiarrhythmic options at this point and may be a candidate for AV nodal ablation.  He does note that the amiodarone  is helping to control his heart rate and that without amiodarone  his heart rate appeared to consistently be in  the 150 bpm range.  Patient notes ongoing frustration with multiple cardioversion attempts and with symptoms appearing to be related to amiodarone  use, agrees that it may not be a long-term solution.  I went over the potential possibility of AV nodal ablation and what it entails.  Given patient's significant tachycardia when out of rhythm, he may benefit from this procedure as a way to help improve quality of life.  We discussed attempt at repeat cardioversion but I did specifically note to patient I am unsure if it would be successful in maintaining SR after procedure.  After discussion, patient elects to proceed with cardioversion.  We discussed the procedure cardioversion to try to convert to NSR. We discussed the risks vs benefits of this procedure and how ultimately we cannot predict whether a patient will have early return of arrhythmia post procedure. After discussion, the patient wishes to proceed with cardioversion. Labs drawn today.  Since patient had an unsuccessful cardioversion at [facility] limited to 200 J, may benefit from repeat attempt at higher joules of energy.  Informed Consent   Shared Decision Making/Informed Consent The risks (stroke, cardiac arrhythmias rarely resulting in the need for a temporary or permanent pacemaker, skin irritation or burns and complications associated with conscious sedation including aspiration, arrhythmia, respiratory failure and death), benefits (restoration of normal sinus rhythm) and alternatives of a direct current cardioversion were explained in detail to Mr. Bielefeld and he agrees to proceed.        High risk medication monitoring (ICD10: J342684) Patient requires ongoing monitoring for anti-arrhythmic medication which has the potential to cause life threatening arrhythmias or AV block. Qtc stable. Continue amiodarone  200 mg daily.  Secondary Hypercoagulable State (ICD10:  D68.69) The patient is at significant risk for  stroke/thromboembolism based upon his CHA2DS2-VASc Score of 2.  Continue Rivaroxaban  (Xarelto ).  No missed doses of Xarelto .     Follow up after DCCV as scheduled with [name].   Dorn Heinrich, PA-C Afib Clinic [facility] [address] [place], [place] [address] [phone]

## 2024-02-20 NOTE — Progress Notes (Addendum)
 Primary Care Physician: Jesus Pock, PA-C Referring Physician: [name] Barnes: Jesus Barnes Barnes Jesus Barnes is a 53 y.o. male with a h/o dilated cardiomyopathy, HTN, persistent  afib in the past on Tikosyn  but stopped due to being ineffective and noncompliance. He was then placed on amiodarone  but cardioversion failed to convert pt and this was stopped March of 2022. It was thought that alcohol consumption was contributing to not being able to maintain SR as well.   He was seen by Jesus Barnes 04/03/22 and was found to have low EF and felt poorly. It was discussed with pt that he had developed tachycardia mediated cardiomyopathy and it was vital for him to be returned to SR. He discussed repeating dofetilide  load with admission. PT states he is committed to stop using alcohol and be compliant with Tikosyn  this go round.  His lopressor  was stopped and he was placed on Toprol  XL 100 mg bid.   He is now here for Tikosyn  admit. No benadryl use. No missed anticoagulation. He is feeling lightheaded this am and BP is soft around 90 systolic. He thinks he has gotten behind on his water intake. He is pending a sleep study in the near future. He has afib with RVR today.   F/u in afib clinic, 04/27/21. He  is here for one week f/u of tikosyn  admit. He checked his HR last night and it was noted to be 55 bpm. 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 He is being compliant with tikosyn .   F/u in afib clinic,05/04/22. He continues in afib, rate controlled. 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 We discussed today to pursue cardioversion and he wants to proceed. No missed anticoagulation and compliant with tikosyn . QT is reading prolonged in afib but inaccurate when out of rhythm.   On follow up 12/28/22, he is  currently in NSR. S/p Afib ablation on 12/01/22 by Jesus Barnes. Currently taking Tikosyn  500 mcg BID. No episodes of Afib since ablation. No chest pain, SOB, or trouble swallowing. Leg sites healed without issue. No missed doses of Xarelto  20 mg. He states he feels well in normal rhythm and is very appreciative. He has an upcoming appointment at [facility] to speak with someone regarding hybrid ablation.  Follow-up 02/20/2024.  Patient is currently in A-fib.  Patient is overall very frustrated today because he was told he was getting a cardioversion today.  He has been fasting since yesterday.  He took all of his morning medications.  He was last seen by Jesus Barnes on 8/11 and deemed medication failure with Tikosyn .  He was transitioned to amiodarone .  Per review of notes, patient underwent cardioversion at [facility] and it was unsuccessful.  He was seen by Jesus Barnes on 10/8 and noted to be in A-fib with controlled ventricular response on amiodarone .  The patient noted disequilibrium and visual complaints since starting the amiodarone .  He does state today that he has ongoing visual symptoms and is off balance at times.  He has not missed any doses of amiodarone .  No missed doses of Xarelto .  Today, he denies symptoms of orthopnea, PND, lower extremity edema, dizziness, presyncope, syncope, snoring, daytime somnolence, bleeding, or neurologic sequela. The patient is tolerating medications without  difficulties and is otherwise without complaint today.    Past Medical History:  Diagnosis Date   A-fib (HCC) 04/18/2022   Angina pectoris 04/05/2016   Anxiety 09/02/2014   Back pain    Burn    Chest pain    Chronic anticoagulation 10/16/2016   Chronic fatigue syndrome    Depression    Dilated cardiomyopathy (HCC) 10/16/2016   EF 30-35%, 05/15/16 40%, 07/17/18 55-60%   Dysphagia    Encounter for monitoring dofetilide  therapy 10/16/2016   Esophageal stricture 09/02/2014   Essential hypertension  09/01/2014   GERD (gastroesophageal reflux disease)    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HCC)    High cholesterol    High risk medication use 09/01/2014   Overview:   Flecanide     Hypertension    Hypertension, essential 08/08/2018   Hypertensive heart disease 09/01/2014   Joint pain    Mild CAD 10/16/2016   Morbid obesity with body mass index (BMI) of 45.0 to 49.9 in adult [facility]) 04/05/2016   Obstructive sleep apnea    awaiting treatment   On amiodarone  therapy 10/16/2016   Palpitation    Paroxysmal atrial fibrillation (HCC) 12/12/2016   Persistent atrial fibrillation (HCC) 09/01/2014   Overview:  CHADS2=1   Sleep apnea    SOB (shortness of breath)    Past Surgical History:  Procedure Laterality Date   APPENDECTOMY     ATRIAL FIBRILLATION ABLATION N/A 12/01/2022   Procedure: ATRIAL FIBRILLATION ABLATION;  Surgeon: Jesus Soyla Lunger, MD;  Location: [facility] INVASIVE CV LAB;  Service: Cardiovascular;  Laterality: N/A;   CARDIOVERSION     CARDIOVERSION N/A 12/14/2016   Procedure: CARDIOVERSION;  Surgeon: Jesus Danelle ORN, MD;  Location: [facility] INVASIVE CV LAB;  Service: Cardiovascular;  Laterality: N/A;   CARDIOVERSION N/A 08/15/2018   Procedure: CARDIOVERSION;  Surgeon: Jesus Headland, MD;  Location: [facility] ENDOSCOPY;  Service: Cardiovascular;  Laterality: N/A;   CARDIOVERSION N/A 04/30/2020   Procedure: CARDIOVERSION;  Surgeon: Jesus Headland, MD;  Location: [facility] ENDOSCOPY;  Service: Cardiovascular;  Laterality: N/A;   CARDIOVERSION N/A 06/02/2020   Procedure: CARDIOVERSION (CATH LAB);  Surgeon: Jesus Soyla Lunger, MD;  Location: [facility] INVASIVE CV LAB;  Service: Cardiovascular;  Laterality: N/A;   CARDIOVERSION N/A 04/20/2022   Procedure: CARDIOVERSION;  Surgeon: Jesus Vinie BROCKS, MD;  Location: [facility] ENDOSCOPY;  Service: Cardiovascular;  Laterality: N/A;   CARDIOVERSION N/A 05/23/2022   Procedure: CARDIOVERSION;  Surgeon: Jesus Maude BROCKS, MD;  Location: [facility] ENDOSCOPY;  Service: Cardiovascular;  Laterality: N/A;    COLONOSCOPY  06/18/2001    Bx: Tubulovillous adenoma. Done by Dr. Towana. He was told to come back 28yr later, but pt didnot.    ESOPHAGOGASTRODUODENOSCOPY  12/17/2014   mild gastritis. Small antral polyps (likely inflammatory). Status post empiric esophageal dilatation   HERNIA REPAIR     umblical   KNEE SURGERY     SKIN GRAFT     URETHRA SURGERY      Current Outpatient Medications  Medication Sig Dispense Refill   amiodarone  (PACERONE ) 200 MG tablet Take 400mg  by mouth twice daily for 2 weeks. Take 200mg  twice daily for 2 weeks. Then take 200mg  daily there after. 180 tablet 2   aspirin-acetaminophen -caffeine (EXCEDRIN MIGRAINE) 250-250-65 MG tablet Take 3 tablets by mouth daily as needed for headache.     clonazePAM (KLONOPIN) 1 MG tablet Take 1 mg by mouth daily.     diltiazem  (CARDIZEM ) 30 MG tablet Take 30 mg by mouth as needed. For HR greater than 100  furosemide  (LASIX ) 40 MG tablet Take 1 tablet (40 mg total) by mouth daily. 90 tablet 1   metoprolol  tartrate (LOPRESSOR ) 50 MG tablet Take 50 mg by mouth daily.     nitroGLYCERIN  (NITROSTAT ) 0.4 MG SL tablet Place 0.4 mg under the tongue every 5 (five) minutes as needed for chest pain.     pantoprazole  (PROTONIX ) 40 MG tablet Take 40 mg by mouth daily.     PARoxetine (PAXIL) 20 MG tablet Take 20 mg by mouth every morning.     potassium chloride  SA (KLOR-CON  M20) 20 MEQ tablet Take 1 tablet (20 mEq total) by mouth daily. 90 tablet 2   rosuvastatin (CRESTOR) 10 MG tablet Take 10 mg by mouth daily.     sacubitril -valsartan  (ENTRESTO ) 24-26 MG Take 1 tablet by mouth 2 (two) times daily. 180 tablet 3   spironolactone  (ALDACTONE ) 25 MG tablet TAKE 1 TABLET (25 MG TOTAL) BY MOUTH DAILY. 90 tablet 3   temazepam (RESTORIL) 15 MG capsule Take 15 mg by mouth at bedtime as needed for sleep.     XARELTO  20 MG TABS tablet TAKE 1 TABLET BY MOUTH DAILY WITH SUPPER 90 tablet 1   No current facility-administered medications for this encounter.     No Known Allergies  ROS- All systems are reviewed and negative except as per the HPI above  Physical Exam: Vitals:   02/20/24 1109  BP: (!) 124/90  Pulse: (!) 103  Weight: (!) 189.6 kg  Height: 6' 2 (1.88 m)     Wt Readings from Last 3 Encounters:  02/20/24 (!) 189.6 kg  12/26/23 (!) 191 kg  12/19/23 (!) 192.7 kg    Labs: Lab Results  Component Value Date   NA 138 11/28/2022   K 4.6 11/28/2022   CL 99 11/28/2022   CO2 21 11/28/2022   GLUCOSE 151 (H) 11/28/2022   BUN 16 11/28/2022   CREATININE 0.82 11/28/2022   CALCIUM 8.9 11/28/2022   MG 2.0 12/28/2022   Lab Results  Component Value Date   INR 1.3 (H) 06/02/2020   Lab Results  Component Value Date   CHOL 165 05/27/2020   HDL 52 05/27/2020   LDLCALC 94 05/27/2020   TRIG 105 05/27/2020   GEN- The patient is well appearing, alert and oriented x 3 today.   Neck - no JVD or carotid bruit noted Lungs- Clear to ausculation bilaterally, normal work of breathing Heart- Irregular rate and rhythm, no murmurs, rubs or gallops, PMI not laterally displaced Extremities- no clubbing, cyanosis, or edema Skin - no rash or ecchymosis noted   EKG-  EKG Interpretation Date/Time:  Wednesday February 20 2024 11:12:51 EST Ventricular Rate:  103 PR Interval:    QRS Duration:  100 QT Interval:  382 QTC Calculation: 500 R Axis:   14  Text Interpretation: Atrial fibrillation with rapid ventricular response Nonspecific ST abnormality Abnormal ECG When compared with ECG of 19-Dec-2023 16:16, PREVIOUS ECG IS PRESENT Confirmed by Terra Pac ([id number]) on 02/20/2024 11:43:56 AM    Echo 04/18/2023: 1. Left ventricular ejection fraction, by estimation, is 40 to 45%. The  left ventricle has mildly decreased function. The left ventricle  demonstrates global hypokinesis. Left ventricular diastolic parameters are  indeterminate.   2. Right ventricular systolic function is normal. The right ventricular  size is mildly enlarged.    3. Right atrial size was mildly dilated.   4. The mitral valve was not well visualized. No evidence of mitral valve  regurgitation. No evidence of mitral  stenosis.   5. The aortic valve is tricuspid. Aortic valve regurgitation is not  visualized. No aortic stenosis is present.   Comparison(s): A prior study was performed on 07/18/2022. LVEF was  reported 45-50%.   CHA2DS2-VASc Score = 2  The patient's score is based upon: CHF History: 1 HTN History: 1 Diabetes History: 0 Stroke History: 0 Vascular Disease History: 0 Age Score: 0 Gender Score: 0       ASSESSMENT AND PLAN: Persistent Atrial Fibrillation (ICD10:  I48.19) The patient's CHA2DS2-VASc score is 2, indicating a 2.2% annual risk of stroke.   Pt failed Tikosyn  in the past for noncompliance and amiodarone  was stopped in 05/2020 for being refractory to afib. Multiple cardioversion's over the years.  He has stopped alcohol use.   S/p Afib ablation on 12/01/22 by Jesus Barnes.   Patient is currently in A-fib.  He was last seen by Jesus Barnes on 10/22/2023 and Tikosyn  stopped due to medication failure.  Patient was transitioned to amiodarone  and underwent unsuccessful cardioversion at [facility].  There is a limitation of joules of energy at [facility] due to machine limitation.  Patient noted to Jesus Barnes in October that he had disequilibrium and visual symptoms since starting the medication.  Patient notes he has same symptoms going on and it appears to have coincided with start of amiodarone .  Patient has a follow-up scheduled with Jesus Barnes next month. Jesus Barnes was concerned that patient may not be a candidate for long-term amiodarone  therapy given age and symptoms.    Patient has no other antiarrhythmic options at this point and may be a candidate for AV nodal ablation.  He does note that the amiodarone  is helping to control his heart rate and that without amiodarone  his heart rate appeared to consistently be in  the 150 bpm range.  Patient notes ongoing frustration with multiple cardioversion attempts and with symptoms appearing to be related to amiodarone  use, agrees that it may not be a long-term solution.  I went over the potential possibility of AV nodal ablation and what it entails.  Given patient's significant tachycardia when out of rhythm, he may benefit from this procedure as a way to help improve quality of life.  We discussed attempt at repeat cardioversion but I did specifically note to patient I am unsure if it would be successful in maintaining SR after procedure.  After discussion, patient elects to proceed with cardioversion.  We discussed the procedure cardioversion to try to convert to NSR. We discussed the risks vs benefits of this procedure and how ultimately we cannot predict whether a patient will have early return of arrhythmia post procedure. After discussion, the patient wishes to proceed with cardioversion. Labs drawn today.  Since patient had an unsuccessful cardioversion at [facility] limited to 200 J, may benefit from repeat attempt at higher joules of energy.  Informed Consent   Shared Decision Making/Informed Consent The risks (stroke, cardiac arrhythmias rarely resulting in the need for a temporary or permanent pacemaker, skin irritation or burns and complications associated with conscious sedation including aspiration, arrhythmia, respiratory failure and death), benefits (restoration of normal sinus rhythm) and alternatives of a direct current cardioversion were explained in detail to Jesus Barnes and he agrees to proceed.        High risk medication monitoring (ICD10: J342684) Patient requires ongoing monitoring for anti-arrhythmic medication which has the potential to cause life threatening arrhythmias or AV block. Qtc stable. Continue amiodarone  200 mg daily.  Secondary Hypercoagulable State (ICD10:  D68.69) The patient is at significant risk for  stroke/thromboembolism based upon his CHA2DS2-VASc Score of 2.  Continue Rivaroxaban  (Xarelto ).  No missed doses of Xarelto .     Follow up after DCCV as scheduled with Jesus Barnes.   Jesus Heinrich, PA-C Afib Clinic [facility] [address] [place], [place] [address] [phone]

## 2024-02-21 ENCOUNTER — Other Ambulatory Visit: Payer: Self-pay

## 2024-02-21 ENCOUNTER — Ambulatory Visit (HOSPITAL_COMMUNITY): Admitting: Anesthesiology

## 2024-02-21 ENCOUNTER — Encounter (HOSPITAL_COMMUNITY): Payer: Self-pay | Admitting: Cardiology

## 2024-02-21 ENCOUNTER — Ambulatory Visit (HOSPITAL_COMMUNITY): Payer: Self-pay | Admitting: Internal Medicine

## 2024-02-21 ENCOUNTER — Encounter (HOSPITAL_COMMUNITY)
Admission: RE | Disposition: A | Discharge status: Home / Self Care | Payer: Self-pay | Source: Home / Self Care | Attending: Cardiology

## 2024-02-21 ENCOUNTER — Ambulatory Visit (HOSPITAL_COMMUNITY)
Admission: RE | Admit: 2024-02-21 | Discharge: 2024-02-21 | Disposition: A | Discharge status: Home / Self Care | Attending: Cardiology | Admitting: Cardiology

## 2024-02-21 DIAGNOSIS — I4891 Unspecified atrial fibrillation: Secondary | ICD-10-CM | POA: Diagnosis not present

## 2024-02-21 DIAGNOSIS — I4819 Other persistent atrial fibrillation: Secondary | ICD-10-CM

## 2024-02-21 DIAGNOSIS — G4733 Obstructive sleep apnea (adult) (pediatric): Secondary | ICD-10-CM | POA: Diagnosis not present

## 2024-02-21 DIAGNOSIS — D6869 Other thrombophilia: Secondary | ICD-10-CM | POA: Diagnosis not present

## 2024-02-21 DIAGNOSIS — I10 Essential (primary) hypertension: Secondary | ICD-10-CM | POA: Diagnosis not present

## 2024-02-21 DIAGNOSIS — Z87891 Personal history of nicotine dependence: Secondary | ICD-10-CM | POA: Diagnosis not present

## 2024-02-21 DIAGNOSIS — Z79899 Other long term (current) drug therapy: Secondary | ICD-10-CM | POA: Diagnosis not present

## 2024-02-21 DIAGNOSIS — Z7901 Long term (current) use of anticoagulants: Secondary | ICD-10-CM | POA: Diagnosis not present

## 2024-02-21 DIAGNOSIS — I2510 Atherosclerotic heart disease of native coronary artery without angina pectoris: Secondary | ICD-10-CM | POA: Diagnosis not present

## 2024-02-21 DIAGNOSIS — I420 Dilated cardiomyopathy: Secondary | ICD-10-CM | POA: Diagnosis not present

## 2024-02-21 DIAGNOSIS — Z006 Encounter for examination for normal comparison and control in clinical research program: Secondary | ICD-10-CM

## 2024-02-21 HISTORY — PX: CARDIOVERSION: EP1203

## 2024-02-21 SURGERY — CARDIOVERSION (CATH LAB)
Anesthesia: General

## 2024-02-21 MED ORDER — SODIUM CHLORIDE 0.9 % IV SOLN: INTRAVENOUS | Status: DC

## 2024-02-21 MED ORDER — PROPOFOL 10 MG/ML IV BOLUS
INTRAVENOUS | Status: DC | PRN
  Administered 2024-02-21: 80 mg via INTRAVENOUS
  Administered 2024-02-21: 20 mg via INTRAVENOUS

## 2024-02-21 MED ORDER — LIDOCAINE 2% (20 MG/ML) 5 ML SYRINGE
INTRAMUSCULAR | Status: DC | PRN
  Administered 2024-02-21: 60 mg via INTRAVENOUS

## 2024-02-21 SURGICAL SUPPLY — 1 items: PAD DEFIB RADIO PHYSIO CONN (PAD) ×1 IMPLANT

## 2024-02-21 NOTE — Anesthesia Preprocedure Evaluation (Addendum)
 Anesthesia Evaluation  Patient identified by MRN, date of birth, ID band Patient awake    Reviewed: Allergy & Precautions, NPO status , Patient's Chart, lab work & pertinent test results  Airway Mallampati: II  TM Distance: >3 FB Neck ROM: Full    Dental no notable dental hx.    Pulmonary sleep apnea , Patient abstained from smoking., former smoker   Pulmonary exam normal        Cardiovascular hypertension, Pt. on medications and Pt. on home beta blockers + CAD  + dysrhythmias Atrial Fibrillation  Rhythm:Irregular Rate:Normal     Neuro/Psych   Anxiety Depression    negative neurological ROS     GI/Hepatic Neg liver ROS,GERD  ,,  Endo/Other  negative endocrine ROS    Renal/GU negative Renal ROS  negative genitourinary   Musculoskeletal negative musculoskeletal ROS (+)    Abdominal Normal abdominal exam  (+)   Peds  Hematology Lab Results      Component                Value               Date                      WBC                      6.5                 02/20/2024                HGB                      15.6                02/20/2024                HCT                      47.4                02/20/2024                MCV                      91                  02/20/2024                PLT                      255                 02/20/2024              Anesthesia Other Findings   Reproductive/Obstetrics                              Anesthesia Physical Anesthesia Plan  ASA: 3  Anesthesia Plan: General   Post-op Pain Management:    Induction: Intravenous  PONV Risk Score and Plan: 2 and Treatment may vary due to age or medical condition  Airway Management Planned: Mask  Additional Equipment: None  Intra-op Plan:   Post-operative Plan:   Informed Consent: I have reviewed the patients History and Physical, chart, labs and discussed the procedure including  the risks,  benefits and alternatives for the proposed anesthesia with the patient or authorized representative who has indicated his/her understanding and acceptance.     Dental advisory given  Plan Discussed with: CRNA  Anesthesia Plan Comments:          Anesthesia Quick Evaluation

## 2024-02-21 NOTE — Anesthesia Postprocedure Evaluation (Signed)
 Anesthesia Post Note  Patient: Jesus Barnes  Procedure(s) Performed: CARDIOVERSION     Patient location during evaluation: PACU Anesthesia Type: General Level of consciousness: awake and alert Pain management: pain level controlled Vital Signs Assessment: post-procedure vital signs reviewed and stable Respiratory status: spontaneous breathing, nonlabored ventilation, respiratory function stable and patient connected to nasal cannula oxygen Cardiovascular status: blood pressure returned to baseline and stable Postop Assessment: no apparent nausea or vomiting Anesthetic complications: no   No notable events documented.  Last Vitals:  Vitals:   02/21/24 0808 02/21/24 0818  BP: 106/76 101/71  Pulse: 70 76  Resp: 20 20  Temp:    SpO2: 96% 95%    Last Pain:  Vitals:   02/21/24 0818  TempSrc:   PainSc: 0-No pain                 Cordella P Merrie Epler

## 2024-02-21 NOTE — Research (Signed)
 [facility] Cardioversion Informed Consent   Subject Name: Jesus Barnes  Subject met inclusion and exclusion criteria.  The informed consent form, study requirements and expectations were reviewed with the subject and questions and concerns were addressed prior to the signing of the consent form.  The subject verbalized understanding of the trial requirements.  The subject agreed to participate in the [facility] Cardioversion trial and signed the informed consent at 0710 on 11/Dec/2025.  The informed consent was obtained prior to performance of any protocol-specific procedures for the subject.  A copy of the signed informed consent was given to the subject and a copy was placed in the subject's medical record.   [name] [name]

## 2024-02-21 NOTE — CV Procedure (Signed)
° °  DIRECT CURRENT CARDIOVERSION  NAME:  Jesus Barnes    MRN: [id number] DOB:  [birth year]    ADMIT DATE: 02/21/2024  Indication:  Symptomatic atrial fibrillation  Procedure Note:  The patient signed informed consent.  He has been on therapeutic anticoagulation with Xarelto  greater than or equal to 3 weeks.  Anesthesia was administered by Dr. Stoltzfus.  Adequate airway was maintained throughout and vital followed per protocol.  He was cardioverted x 3 with 360 J of biphasic synchronized energy.  Post procedure rhythm was atrial fibrillation. There were no apparent complications.  The patient had normal neuro status and respiratory status post procedure with vitals stable as recorded elsewhere.    Follow up:  Continue on current medical therapy. Has an appt with [name] and [facility] in January 2026 Wife called three times but goes to voicemail. Patient is asked to verify his medication list at home - no changes made today.    Madonna Michele HAS, [facility]  A Division of [facility] [address], [place], [place] [address]  8:04 AM

## 2024-02-21 NOTE — Interval H&P Note (Signed)
 History and Physical Interval Note:  02/21/2024 7:46 AM  Zell FORBES Rower  has presented today for surgery, with the diagnosis of afib.  The various methods of treatment have been discussed with the patient and family. After consideration of risks, benefits and other options for treatment, the patient has consented to  Procedures: CARDIOVERSION (N/A) as a surgical intervention.  The patient's history has been reviewed, patient examined, no change in status, stable for surgery.  I have reviewed the patient's chart and labs.  Questions were answered to the patient's satisfaction.    Informed Consent   Shared Decision Making/Informed Consent The risks (stroke, cardiac arrhythmias rarely resulting in the need for a temporary or permanent pacemaker, skin irritation or burns and complications associated with conscious sedation including aspiration, arrhythmia, respiratory failure and death), benefits (restoration of normal sinus rhythm) and alternatives of a direct current cardioversion were explained in detail to Mr. Labrada and he agrees to proceed.      Contact person: Wife  No missed doses of Xarelto  for the last 3 weeks.   Bader Stubblefield

## 2024-02-21 NOTE — Transfer of Care (Signed)
 Immediate Anesthesia Transfer of Care Note  Patient: Jesus Barnes  Procedure(s) Performed: CARDIOVERSION  Patient Location: PACU  Anesthesia Type:MAC  Level of Consciousness: awake and drowsy  Airway & Oxygen Therapy: Patient Spontanous Breathing and Patient connected to face mask oxygen  Post-op Assessment: Report given to RN and Post -op Vital signs reviewed and stable  Post vital signs: Reviewed and stable  Last Vitals:  Vitals Value Taken Time  BP    Temp    Pulse    Resp    SpO2      Last Pain:  Vitals:   02/21/24 0700  TempSrc: Temporal         Complications: No notable events documented.

## 2024-02-21 NOTE — Discharge Instructions (Signed)

## 2024-02-22 ENCOUNTER — Encounter (HOSPITAL_COMMUNITY): Payer: Self-pay | Admitting: Cardiology

## 2024-03-15 NOTE — Progress Notes (Unsigned)
 " Electrophysiology Office Note:   Date:  03/17/2024  ID:  Jesus Barnes, DOB Dec 02, 1970, MRN [id number]  Primary Cardiologist: Redell Leiter, MD Primary Heart Failure: None Electrophysiologist: Mikhala Kenan Gladis Norton, MD      History of Present Illness:   Jesus Barnes is a 54 y.o. male with h/o chronic systolic heart failure due to dilated cardiomyopathy, hypertension, atrial fibrillation seen today for routine electrophysiology followup.   He was switched from dofetilide  to amiodarone .  He continued to have episodes of atrial fibrillation.  He is now post cardioversion attempt 02/21/2024, but unfortunately did not convert to sinus rhythm.  He had no sinus beats.  Discussed the use of AI scribe software for clinical note transcription with the patient, who gave verbal consent to proceed.  History of Present Illness Jesus Barnes is a 54 year old male with arrhythmia who presents with concerns about heart rate control and fatigue.  He experiences episodes of increased heart rate, particularly when getting up, walking, or during the night, accompanied by sweating and a sensation of 'a fish flopping around' in his chest. He is currently taking metoprolol  once a day but recalls being on a higher dose of 100 mg previously. He is frustrated with his current medication regimen and the lack of improvement in his symptoms.  He has significant fatigue that impacts his ability to perform daily activities such as yard work. He notes a weight gain of 55 pounds since his heart issues began, which he attributes to his inability to be as active as he once was. He wants to lose weight but faces financial constraints that limit his ability to follow recommended diets or weight loss programs.  He has a history of arrhythmia dating back to 2003, with a recurrence in 2015 or 2016. He feels that the prolonged nature of his condition has gradually worsened his overall health and energy levels. He wants to return to  activities such as going to the gym and lifting weights but feels hindered by his current health status.  He has difficulty sleeping through the night unless he takes Klonopin, which he uses to manage his sleep issues. He fears his heart condition worsening during the night, especially when his family is asleep, contributing to his sleep disturbances.  He previously attempted to seek weight loss treatment but found the dietary recommendations financially unfeasible. He strongly wants to lose weight but feels limited by his fixed income and the cost of healthier food options.  he denies chest pain, palpitations, dyspnea, PND, orthopnea, nausea, vomiting, dizziness, syncope, edema, weight gain, or early satiety.   Review of systems complete and found to be negative unless listed in HPI.   EP Information / Studies Reviewed:    EKG is ordered today. Personal review as below.  EKG Interpretation Date/Time:  Monday March 17 2024 10:07:47 EST Ventricular Rate:  79 PR Interval:    QRS Duration:  104 QT Interval:  424 QTC Calculation: 486 R Axis:   9  Text Interpretation: Atrial fibrillation Prolonged QT Abnormal ECG When compared with ECG of 20-Feb-2024 11:12, No significant change was found Confirmed by Aminata Buffalo ([id number]) on 03/17/2024 10:25:53 AM     Risk Assessment/Calculations:    CHA2DS2-VASc Score = 2   This indicates a 2.2% annual risk of stroke. The patient's score is based upon: CHF History: 1 HTN History: 1 Diabetes History: 0 Stroke History: 0 Vascular Disease History: 0 Age Score: 0 Gender Score: 0  Physical Exam:   VS:  BP 126/82 (BP Location: Right Arm, Patient Position: Sitting, Cuff Size: Large)   Pulse 79   Ht 6' 2 (1.88 m)   Wt (!) 414 lb (187.8 kg)   SpO2 95%   BMI 53.15 kg/m    Wt Readings from Last 3 Encounters:  03/17/24 (!) 414 lb (187.8 kg)  02/20/24 (!) 418 lb (189.6 kg)  12/26/23 (!) 421 lb (191 kg)     GEN: Well nourished,  well developed in no acute distress NECK: No JVD; No carotid bruits CARDIAC: Irregularly irregular rate and rhythm, no murmurs, rubs, gallops RESPIRATORY:  Clear to auscultation without rales, wheezing or rhonchi  ABDOMEN: Soft, non-tender, non-distended EXTREMITIES:  No edema; No deformity   ASSESSMENT AND PLAN:    1.  Persistent atrial fibrillation: Has failed cardioversion and amiodarone .  Did have an attempt at ablation, but has had more episodes of atrial fibrillation.  His rates are quite rapid.  He feels quite poor in atrial fibrillation.  He has no other antiarrhythmic options.  In addition, he had a cardioversion and did not convert with 360 J shock.  At this point, I think that his atrial fibrillation is likely become permanent.  We Jennene Downie plan for rate control.  He is on metoprolol  50 mg daily.  Donnald Tabar switch him to Toprol -XL 50 mg twice daily for improved rate control.  Weyman Bogdon have him wear a 2-week monitor to ensure that his rates are well-controlled throughout the day.  Hopefully we can avoid AV nodal ablation and pacemaker implant as he has a narrow QRS complex.  2.  Secondary hypercoagulable state: On Xarelto   3.  High risk medication monitoring: On amiodarone .  LFTs elevated at last check.  Andres Vest stop amiodarone  today.  4.  Obesity: Lance Galas plan for referral to [facility] weight loss clinic.  He is quite motivated to lose weight.  If he can use injectable weight loss medications, this would be likely a reasonable option.  Follow up with EP Team in 6 months  Signed, Jenesys Casseus Gladis Norton, MD  "

## 2024-03-17 ENCOUNTER — Other Ambulatory Visit: Payer: Self-pay

## 2024-03-17 ENCOUNTER — Ambulatory Visit: Attending: Cardiology | Admitting: Cardiology

## 2024-03-17 ENCOUNTER — Encounter: Payer: Self-pay | Admitting: Cardiology

## 2024-03-17 ENCOUNTER — Ambulatory Visit

## 2024-03-17 VITALS — BP 126/82 | HR 79 | Ht 74.0 in | Wt >= 6400 oz

## 2024-03-17 DIAGNOSIS — I4819 Other persistent atrial fibrillation: Secondary | ICD-10-CM | POA: Diagnosis not present

## 2024-03-17 DIAGNOSIS — D6869 Other thrombophilia: Secondary | ICD-10-CM

## 2024-03-17 MED ORDER — METOPROLOL SUCCINATE ER 50 MG PO TB24: 50.0000 mg | ORAL_TABLET | Freq: Two times a day (BID) | ORAL | 3 refills | Status: AC

## 2024-03-17 NOTE — Progress Notes (Unsigned)
 Enrolled patient for a 14 day Zio XT  monitor to be mailed to patients home

## 2024-03-17 NOTE — Patient Instructions (Signed)
 Medication Instructions:  Your physician has recommended you make the following change in your medication:  STOP Amiodarone  STOP Lopressor  START Toprol  50 mg TWICE a day  *If you need a refill on your cardiac medications before your next appointment, please call your pharmacy*  Lab Work: None ordered  If you have any lab test that is abnormal or we need to change your treatment, we will call you to review the results.  Testing/Procedures:                           Jesus Barnes- Long Term Monitor Instructions  Your physician has requested you wear a ZIO patch monitor for 14 days.  This is a single patch monitor. [employer] supplies one patch monitor per enrollment. Additional stickers are not available. Please do not apply patch if you will be having a Nuclear Stress Test,  Echocardiogram, Cardiac CT, MRI, or Chest Xray during the period you would be wearing the  monitor. The patch cannot be worn during these tests. You cannot remove and re-apply the  ZIO XT patch monitor.  Your ZIO patch monitor will be mailed 3 day USPS to your address on file. It may take 3-5 days  to receive your monitor after you have been enrolled.  Once you have received your monitor, please review the enclosed instructions. Your monitor  has already been registered assigning a specific monitor serial # to you.  Billing and Patient Assistance Program Information  We have supplied [employer] with any of your insurance information on file for billing purposes. [employer] offers a sliding scale Patient Assistance Program for patients that do not have  insurance, or whose insurance does not completely cover the cost of the ZIO monitor.  You must apply for the Patient Assistance Program to qualify for this discounted rate.  To apply, please call [employer] at [phone], select option 4, select option 2, ask to apply for  Patient Assistance Program. [name] will ask your household income, and how many people  are in your  household. They will quote your out-of-pocket cost based on that information.  [employer] will also be able to set up a 16-month, interest-free payment plan if needed.  Applying the monitor   Shave hair from upper left chest.  Hold abrader disc by orange tab. Rub abrader in 40 strokes over the upper left chest as  indicated in your monitor instructions.  Clean area with 4 enclosed alcohol pads. Let dry.  Apply patch as indicated in monitor instructions. Patch will be placed under collarbone on left  side of chest with arrow pointing upward.  Rub patch adhesive wings for 2 minutes. Remove white label marked 1. Remove the white  label marked 2. Rub patch adhesive wings for 2 additional minutes.  While looking in a mirror, press and release button in center of patch. A small green light will  flash 3-4 times. This will be your only indicator that the monitor has been turned on.  Do not shower for the first 24 hours. You may shower after the first 24 hours.  Press the button if you feel a symptom. You will hear a small click. Record Date, Time and  Symptom in the Patient Logbook.  When you are ready to remove the patch, follow instructions on the last 2 pages of Patient  Logbook. Stick patch monitor onto the last page of Patient Logbook.  Place Patient Logbook in the blue and white box. Use locking  tab on box and tape box closed  securely. The blue and white box has prepaid postage on it. Please place it in the mailbox as  soon as possible. Your physician should have your test results approximately 7 days after the  monitor has been mailed back to [facility].  Call [facility] Customer Care at [phone] if you have questions regarding  your ZIO XT patch monitor. Call them immediately if you see an orange light blinking on your  monitor.  If your monitor falls off in less than 4 days, contact our Monitor department at [phone].  If your monitor becomes loose or falls off after  4 days call [employer] at [phone] for  suggestions on securing your monitor  Follow-Up: At [facility], you and your health needs are our priority.  As part of our continuing mission to provide you with exceptional heart care, our providers are all part of one team.  This team includes your primary Cardiologist (physician) and Advanced Practice Providers or APPs (Physician Assistants and Nurse Practitioners) who all work together to provide you with the care you need, when you need it.  You have been referred to weight loss clinic.  Your next appointment:   6 month(s)  Provider:   Soyla Norton, MD     Thank you for choosing [facility]!!   Jesus Domino, RN [phone]

## 2024-03-19 NOTE — Progress Notes (Unsigned)
 " Cardiology Office Note:    Date:  03/19/2024   ID:  Jesus Barnes, DOB February 15, 1971, MRN [id number]  PCP:  [name], PA-C  Cardiologist:  Redell Leiter, MD    Referring MD: [name], PA-C    ASSESSMENT:    No diagnosis found. PLAN:    In order of problems listed above:  ***   Next appointment: ***   Medication Adjustments/Labs and Tests Ordered: Current medicines are reviewed at length with the patient today.  Concerns regarding medicines are outlined above.  No orders of the defined types were placed in this encounter.  No orders of the defined types were placed in this encounter.    History of Present Illness:    Jesus Barnes is a 54 y.o. male with a hx of refractory atrial fibrillation failing 2 separate EP catheter ablations antiarrhythmic drug and cardioversions with his care complicated by noncompliance severe obesity and sleep apnea..  Last seen 12/19/2023.  Unfortunately rate controlled atrial fibrillation he feels badly.  I referred him back to EP to consider AV nodal pacing with CRT therapy.  He also has cardiomyopathy with reduced EF uncertain how Entresto  was discontinued and placed back on it at the last visit.  Most recent ejection fraction 40%.  Is just seen in consult by [name] 03/17/2024 who adjusted rate slowing medications with rapid heart rate and for the time being deferred AV nodal ablation and pacemaker therapy. Compliance with diet, lifestyle and medications: *** Past Medical History:  Diagnosis Date   A-fib (HCC) 04/18/2022   Angina pectoris 04/05/2016   Anxiety 09/02/2014   Back pain    Burn    Chest pain    Chronic anticoagulation 10/16/2016   Chronic fatigue syndrome    Depression    Dilated cardiomyopathy (HCC) 10/16/2016   EF 30-35%, 05/15/16 40%, 07/17/18 55-60%   Dysphagia    Encounter for monitoring dofetilide  therapy 10/16/2016   Esophageal stricture 09/02/2014   Essential hypertension 09/01/2014   GERD  (gastroesophageal reflux disease)    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HCC)    High cholesterol    High risk medication use 09/01/2014   Overview:   Flecanide     Hypertension    Hypertension, essential 08/08/2018   Hypertensive heart disease 09/01/2014   Joint pain    Mild CAD 10/16/2016   Morbid obesity with body mass index (BMI) of 45.0 to 49.9 in adult (HCC) 04/05/2016   Obstructive sleep apnea    awaiting treatment   On amiodarone  therapy 10/16/2016   Palpitation    Paroxysmal atrial fibrillation (HCC) 12/12/2016   Persistent atrial fibrillation (HCC) 09/01/2014   Overview:  CHADS2=1   Sleep apnea    SOB (shortness of breath)     Current Medications: Active Medications[1]    EKGs/Labs/Other Studies Reviewed:    The following studies were reviewed today:  Cardiac Studies & Procedures   ______________________________________________________________________________________________     ECHOCARDIOGRAM  ECHOCARDIOGRAM COMPLETE 04/18/2023  Narrative ECHOCARDIOGRAM REPORT    Patient Name:   Jesus Barnes Date of Exam: 04/18/2023 Medical Rec #:  [id number]       Height:       74.5 in Accession #:    [id number]      Weight:       411.0 lb Date of Birth:  05-14-1970        BSA:          2.966 m Patient Age:    46 years  BP:           126/84 mmHg Patient Gender: M               HR:           117 bpm. Exam Location:  [place]  Procedure: 2D Echo, Cardiac Doppler, Color Doppler and Intracardiac Opacification Agent  Indications:    Persistent atrial fibrillation (HCC) [I48.19 (ICD-10-CM)]; Angina pectoris (HCC) [I20.9 (ICD-10-CM)]  History:        Patient has prior history of Echocardiogram examinations, most recent 07/18/2022. CHF and Cardiomyopathy, CAD, Arrythmias:Atrial Fibrillation, Signs/Symptoms:Chest Pain and Shortness of Breath; Risk Factors:Hypertension and Dyslipidemia.  Sonographer:    Charlie Jointer RDCS Referring Phys: [id number] WILL MARTIN CAMNITZ   Sonographer  Comments: Image acquisition challenging due to patient body habitus. IMPRESSIONS   1. Left ventricular ejection fraction, by estimation, is 40 to 45%. The left ventricle has mildly decreased function. The left ventricle demonstrates global hypokinesis. Left ventricular diastolic parameters are indeterminate. 2. Right ventricular systolic function is normal. The right ventricular size is mildly enlarged. 3. Right atrial size was mildly dilated. 4. The mitral valve was not well visualized. No evidence of mitral valve regurgitation. No evidence of mitral stenosis. 5. The aortic valve is tricuspid. Aortic valve regurgitation is not visualized. No aortic stenosis is present.  Comparison(s): A prior study was performed on 07/18/2022. LVEF was reported 45-50%.  Conclusion(s)/Recommendation(s): Extremely limited visualization despite Definity  contrast use; Consider alternate diagnostic modalities if clinically indicated.  FINDINGS Left Ventricle: Left ventricular ejection fraction, by estimation, is 40 to 45%. The left ventricle has mildly decreased function. The left ventricle demonstrates global hypokinesis. Definity  contrast agent was given IV to delineate the left ventricular endocardial borders. The left ventricular internal cavity size was normal in size. Suboptimal image quality limits for assessment of left ventricular hypertrophy. Left ventricular diastolic parameters are indeterminate.  Right Ventricle: The right ventricular size is mildly enlarged. Right vetricular wall thickness was not well visualized. Right ventricular systolic function is normal.  Left Atrium: Left atrial size was not well visualized.  Right Atrium: Right atrial size was mildly dilated.  Pericardium: There is no evidence of pericardial effusion.  Mitral Valve: The mitral valve was not well visualized. No evidence of mitral valve regurgitation. No evidence of mitral valve stenosis.  Tricuspid Valve: The tricuspid  valve is not well visualized.  Aortic Valve: The aortic valve is tricuspid. Aortic valve regurgitation is not visualized. No aortic stenosis is present.  Pulmonic Valve: The pulmonic valve was not well visualized.  Aorta: The aortic root measuring 4.3cm and ascending aorta diameter 3.7cm are structurally normal, with no evidence of dilitation when indexed to body surface area.  Venous: The inferior vena cava was not well visualized.  IAS/Shunts: No atrial level shunt detected by color flow Doppler.   LEFT VENTRICLE PLAX 2D LVIDd:         5.30 cm   Diastology LVIDs:         4.70 cm   LV e' medial:    13.60 cm/s LV PW:         1.50 cm   LV E/e' medial:  5.7 LV IVS:        1.50 cm   LV e' lateral:   12.10 cm/s LVOT diam:     2.50 cm   LV E/e' lateral: 6.5 LV SV:         58 LV SV Index:   20 LVOT Area:  4.91 cm   RIGHT VENTRICLE             IVC RV Basal diam:  5.00 cm     IVC diam: 2.70 cm RV Mid diam:    4.40 cm RV S prime:     12.40 cm/s TAPSE (M-mode): 2.8 cm  LEFT ATRIUM           Index        RIGHT ATRIUM           Index LA diam:      6.00 cm 2.02 cm/m   RA Area:     19.80 cm LA Vol (A4C): 71.1 ml 23.98 ml/m  RA Volume:   58.90 ml  19.86 ml/m AORTIC VALVE LVOT Vmax:   70.40 cm/s LVOT Vmean:  46.433 cm/s LVOT VTI:    0.118 m  AORTA Ao Root diam: 4.30 cm Ao Asc diam:  3.70 cm  MITRAL VALVE MV Area (PHT): 3.48 cm    SHUNTS MV Decel Time: 218 msec    Systemic VTI:  0.12 m MV E velocity: 78.10 cm/s  Systemic Diam: 2.50 cm  Sreedhar reddy Madireddy Electronically signed by Alean reddy Madireddy Signature Date/Time: 04/18/2023/7:11:38 PM    Final          ______________________________________________________________________________________________          Recent Labs: 10/22/2023: ALT 45 02/20/2024: BUN 17; Creatinine, Ser 0.93; Hemoglobin 15.6; Platelets 255; Potassium 4.9; Sodium 139  Recent Lipid Panel    Component Value Date/Time   CHOL  165 05/27/2020 1209   TRIG 105 05/27/2020 1209   HDL 52 05/27/2020 1209   LDLCALC 94 05/27/2020 1209    Physical Exam:    VS:  There were no vitals taken for this visit.    Wt Readings from Last 3 Encounters:  03/17/24 (!) 414 lb (187.8 kg)  02/20/24 (!) 418 lb (189.6 kg)  12/26/23 (!) 421 lb (191 kg)     GEN: *** Well nourished, well developed in no acute distress HEENT: Normal NECK: No JVD; No carotid bruits LYMPHATICS: No lymphadenopathy CARDIAC: ***RRR, no murmurs, rubs, gallops RESPIRATORY:  Clear to auscultation without rales, wheezing or rhonchi  ABDOMEN: Soft, non-tender, non-distended MUSCULOSKELETAL:  No edema; No deformity  SKIN: Warm and dry NEUROLOGIC:  Alert and oriented x 3 PSYCHIATRIC:  Normal affect    Signed, Redell Leiter, MD  03/19/2024 8:57 AM    [facility]     [1]  No outpatient medications have been marked as taking for the 03/20/24 encounter (Appointment) with Leiter Redell PARAS, MD.   "

## 2024-03-20 ENCOUNTER — Ambulatory Visit: Admitting: Cardiology

## 2024-03-31 NOTE — Progress Notes (Unsigned)
 " Cardiology Office Note:    Date:  04/02/2024   ID:  Jesus Barnes, DOB [birth year], MRN [id number]  PCP:  [name], PA-C  Cardiologist:  Redell Leiter, MD    Referring MD: [name], PA-C    ASSESSMENT:    1. Persistent atrial fibrillation (HCC)   2. Secondary hypercoagulable state   3. Morbid obesity (HCC)   4. Permanent atrial fibrillation (HCC)   5. Dilated cardiomyopathy (HCC)   6. Chronic systolic (congestive) heart failure (HCC)    PLAN:    In order of problems listed above:  Plan rate control much better symptomatic control with current beta-blocker continue his anticoagulant I sent a note to Pharm.D. to see if there is any creative solution to his problem being unable to afford semaglutide Appears to be compensated continue his loop diuretic MRA and Entresto  and beta-blocker.  Consider repeat echocardiogram next visit   Next appointment: 6 months   Medication Adjustments/Labs and Tests Ordered: Current medicines are reviewed at length with the patient today.  Concerns regarding medicines are outlined above.  No orders of the defined types were placed in this encounter.  No orders of the defined types were placed in this encounter.    History of Present Illness:    Jesus Barnes is a 54 y.o. male with a hx of refractory atrial fibrillation failing 2 separate EP catheter ablations antiarrhythmic drug and cardioversions with his care complicated by noncompliance severe obesity and sleep apnea..  Last seen 12/19/2023.  Unfortunately rate controlled atrial fibrillation he feels badly.  I referred him back to EP to consider AV nodal pacing with CRT therapy.  He also has cardiomyopathy with reduced EF uncertain how Entresto  was discontinued and placed back on it at the last visit.  Most recent ejection fraction 40%.  Is just seen in consult by [name] 03/17/2024 who adjusted rate slowing medications with rapid heart rate and for the time being deferred AV  nodal ablation and pacemaker therapy.  Compliance with diet, lifestyle and medications: Yes  And only does he feel better with heart rate control but has come to peace with the fact that he is in permanent atrial fibrillation Desperately wants to get access to semaglutide and I will send a note to Pharm.D. to see if there is any scholarship. Presently not having edema shortness of breath chest pain palpitation or syncope he just is physically unable to do heavy activities He is wearing a monitor now to assess heart rate control Has an appointment in the middle of February he will have lab work in his PCP office Past Medical History:  Diagnosis Date   A-fib (HCC) 04/18/2022   Angina pectoris 04/05/2016   Anxiety 09/02/2014   Arrhythmia 2003   Back pain    Burn    Chest pain    Chronic anticoagulation 10/16/2016   Chronic fatigue syndrome    Depression    Dilated cardiomyopathy (HCC) 10/16/2016   EF 30-35%, 05/15/16 40%, 07/17/18 55-60%   Dysphagia    Encounter for monitoring dofetilide  therapy 10/16/2016   Esophageal stricture 09/02/2014   Essential hypertension 09/01/2014   GERD (gastroesophageal reflux disease)    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HCC)    High cholesterol    High risk medication use 09/01/2014   Overview:   Flecanide     Hypertension    Hypertension, essential 08/08/2018   Hypertensive heart disease 09/01/2014   Joint pain    Mild CAD 10/16/2016   Morbid obesity with  body mass index (BMI) of 45.0 to 49.9 in adult (HCC) 04/05/2016   Obstructive sleep apnea    awaiting treatment   On amiodarone  therapy 10/16/2016   Palpitation    Paroxysmal atrial fibrillation (HCC) 12/12/2016   Persistent atrial fibrillation (HCC) 09/01/2014   Overview:  CHADS2=1   Sleep apnea    SOB (shortness of breath)     Current Medications: Active Medications[1]    EKGs/Labs/Other Studies Reviewed:    The following studies were reviewed today:  Cardiac Studies & Procedures    ______________________________________________________________________________________________     ECHOCARDIOGRAM  ECHOCARDIOGRAM COMPLETE 04/18/2023  Narrative ECHOCARDIOGRAM REPORT    Patient Name:   Jesus Barnes Date of Exam: 04/18/2023 Medical Rec #:  [id number]       Height:       74.5 in Accession #:    [id number]      Weight:       411.0 lb Date of Birth:  [birth year]        BSA:          2.966 m Patient Age:    52 years        BP:           126/84 mmHg Patient Gender: M               HR:           117 bpm. Exam Location:  [facility]  Procedure: 2D Echo, Cardiac Doppler, Color Doppler and Intracardiac Opacification Agent  Indications:    Persistent atrial fibrillation (HCC) [I48.19 (ICD-10-CM)]; Angina pectoris (HCC) [I20.9 (ICD-10-CM)]  History:        Patient has prior history of Echocardiogram examinations, most recent 07/18/2022. CHF and Cardiomyopathy, CAD, Arrythmias:Atrial Fibrillation, Signs/Symptoms:Chest Pain and Shortness of Breath; Risk Factors:Hypertension and Dyslipidemia.  Sonographer:    Charlie Jointer RDCS Referring Phys: [id number] WILL MARTIN CAMNITZ   Sonographer Comments: Image acquisition challenging due to patient body habitus. IMPRESSIONS   1. Left ventricular ejection fraction, by estimation, is 40 to 45%. The left ventricle has mildly decreased function. The left ventricle demonstrates global hypokinesis. Left ventricular diastolic parameters are indeterminate. 2. Right ventricular systolic function is normal. The right ventricular size is mildly enlarged. 3. Right atrial size was mildly dilated. 4. The mitral valve was not well visualized. No evidence of mitral valve regurgitation. No evidence of mitral stenosis. 5. The aortic valve is tricuspid. Aortic valve regurgitation is not visualized. No aortic stenosis is present.  Comparison(s): A prior study was performed on 07/18/2022. LVEF was reported 45-50%.  Conclusion(s)/Recommendation(s):  Extremely limited visualization despite Definity  contrast use; Consider alternate diagnostic modalities if clinically indicated.  FINDINGS Left Ventricle: Left ventricular ejection fraction, by estimation, is 40 to 45%. The left ventricle has mildly decreased function. The left ventricle demonstrates global hypokinesis. Definity  contrast agent was given IV to delineate the left ventricular endocardial borders. The left ventricular internal cavity size was normal in size. Suboptimal image quality limits for assessment of left ventricular hypertrophy. Left ventricular diastolic parameters are indeterminate.  Right Ventricle: The right ventricular size is mildly enlarged. Right vetricular wall thickness was not well visualized. Right ventricular systolic function is normal.  Left Atrium: Left atrial size was not well visualized.  Right Atrium: Right atrial size was mildly dilated.  Pericardium: There is no evidence of pericardial effusion.  Mitral Valve: The mitral valve was not well visualized. No evidence of mitral valve regurgitation. No evidence of mitral valve stenosis.  Tricuspid Valve: The tricuspid  valve is not well visualized.  Aortic Valve: The aortic valve is tricuspid. Aortic valve regurgitation is not visualized. No aortic stenosis is present.  Pulmonic Valve: The pulmonic valve was not well visualized.  Aorta: The aortic root measuring 4.3cm and ascending aorta diameter 3.7cm are structurally normal, with no evidence of dilitation when indexed to body surface area.  Venous: The inferior vena cava was not well visualized.  IAS/Shunts: No atrial level shunt detected by color flow Doppler.   LEFT VENTRICLE PLAX 2D LVIDd:         5.30 cm   Diastology LVIDs:         4.70 cm   LV e' medial:    13.60 cm/s LV PW:         1.50 cm   LV E/e' medial:  5.7 LV IVS:        1.50 cm   LV e' lateral:   12.10 cm/s LVOT diam:     2.50 cm   LV E/e' lateral: 6.5 LV SV:         58 LV SV  Index:   20 LVOT Area:     4.91 cm   RIGHT VENTRICLE             IVC RV Basal diam:  5.00 cm     IVC diam: 2.70 cm RV Mid diam:    4.40 cm RV S prime:     12.40 cm/s TAPSE (M-mode): 2.8 cm  LEFT ATRIUM           Index        RIGHT ATRIUM           Index LA diam:      6.00 cm 2.02 cm/m   RA Area:     19.80 cm LA Vol (A4C): 71.1 ml 23.98 ml/m  RA Volume:   58.90 ml  19.86 ml/m AORTIC VALVE LVOT Vmax:   70.40 cm/s LVOT Vmean:  46.433 cm/s LVOT VTI:    0.118 m  AORTA Ao Root diam: 4.30 cm Ao Asc diam:  3.70 cm  MITRAL VALVE MV Area (PHT): 3.48 cm    SHUNTS MV Decel Time: 218 msec    Systemic VTI:  0.12 m MV E velocity: 78.10 cm/s  Systemic Diam: 2.50 cm  Sreedhar reddy Madireddy Electronically signed by Alean reddy Madireddy Signature Date/Time: 04/18/2023/7:11:38 PM    Final          ______________________________________________________________________________________________          Recent Labs: 10/22/2023: ALT 45 02/20/2024: BUN 17; Creatinine, Ser 0.93; Hemoglobin 15.6; Platelets 255; Potassium 4.9; Sodium 139  Recent Lipid Panel    Component Value Date/Time   CHOL 165 05/27/2020 1209   TRIG 105 05/27/2020 1209   HDL 52 05/27/2020 1209   LDLCALC 94 05/27/2020 1209    Physical Exam:    VS:  BP 130/72   Pulse 84   Ht 6' 2 (1.88 m)   Wt (!) 427 lb 3.2 oz (193.8 kg)   SpO2 98%   BMI 54.85 kg/m     Wt Readings from Last 3 Encounters:  04/02/24 (!) 427 lb 3.2 oz (193.8 kg)  03/17/24 (!) 414 lb (187.8 kg)  02/20/24 (!) 418 lb (189.6 kg)     GEN: Quite obese BMI exceeds 50  HEENT: Normal NECK: No JVD; No carotid bruits LYMPHATICS: No lymphadenopathy CARDIAC: Variable first heart sound irregular rate and rhythm  RESPIRATORY:  Clear to auscultation without rales, wheezing or rhonchi  ABDOMEN: Soft, non-tender, non-distended MUSCULOSKELETAL:  No edema; No deformity  SKIN: Warm and dry NEUROLOGIC:  Alert and oriented x 3 PSYCHIATRIC:   Normal affect    Signed, Redell Leiter, MD  04/02/2024 11:23 AM    [facility]      [1]  Current Meds  Medication Sig   aspirin-acetaminophen -caffeine (EXCEDRIN MIGRAINE) 250-250-65 MG tablet Take 3 tablets by mouth daily as needed for headache.   clonazePAM (KLONOPIN) 1 MG tablet Take 1 mg by mouth daily as needed for anxiety.   diltiazem  (CARDIZEM ) 30 MG tablet Take 30 mg by mouth as needed. For HR greater than 100   furosemide  (LASIX ) 40 MG tablet Take 1 tablet (40 mg total) by mouth daily.   metoprolol  succinate (TOPROL -XL) 50 MG 24 hr tablet Take 1 tablet (50 mg total) by mouth 2 (two) times daily. Take with or immediately following a meal.   nitroGLYCERIN  (NITROSTAT ) 0.4 MG SL tablet Place 0.4 mg under the tongue every 5 (five) minutes as needed for chest pain.   PARoxetine (PAXIL) 20 MG tablet Take 20 mg by mouth every morning.   potassium chloride  SA (KLOR-CON  M20) 20 MEQ tablet Take 1 tablet (20 mEq total) by mouth daily.   rosuvastatin (CRESTOR) 10 MG tablet Take 10 mg by mouth daily.   sacubitril -valsartan  (ENTRESTO ) 24-26 MG Take 1 tablet by mouth 2 (two) times daily.   spironolactone  (ALDACTONE ) 25 MG tablet TAKE 1 TABLET (25 MG TOTAL) BY MOUTH DAILY.   temazepam (RESTORIL) 15 MG capsule Take 15 mg by mouth at bedtime as needed for sleep.   XARELTO  20 MG TABS tablet TAKE 1 TABLET BY MOUTH DAILY WITH SUPPER   "

## 2024-04-02 ENCOUNTER — Ambulatory Visit: Attending: Cardiology | Admitting: Cardiology

## 2024-04-02 ENCOUNTER — Encounter: Payer: Self-pay | Admitting: Cardiology

## 2024-04-02 VITALS — BP 130/72 | HR 84 | Ht 74.0 in | Wt >= 6400 oz

## 2024-04-02 DIAGNOSIS — D6869 Other thrombophilia: Secondary | ICD-10-CM

## 2024-04-02 DIAGNOSIS — I4819 Other persistent atrial fibrillation: Secondary | ICD-10-CM | POA: Diagnosis not present

## 2024-04-02 DIAGNOSIS — I5022 Chronic systolic (congestive) heart failure: Secondary | ICD-10-CM

## 2024-04-02 DIAGNOSIS — I420 Dilated cardiomyopathy: Secondary | ICD-10-CM

## 2024-04-02 DIAGNOSIS — I4821 Permanent atrial fibrillation: Secondary | ICD-10-CM

## 2024-04-02 NOTE — Patient Instructions (Signed)
